# Patient Record
Sex: Male | Born: 1985 | Race: White | Hispanic: No | Marital: Single
Health system: Midwestern US, Community
[De-identification: ages and names within clinical notes are randomized; demographics above are authoritative.]

## PROBLEM LIST (undated history)

## (undated) DIAGNOSIS — M545 Low back pain, unspecified: Secondary | ICD-10-CM

## (undated) DIAGNOSIS — G8929 Other chronic pain: Secondary | ICD-10-CM

## (undated) DIAGNOSIS — J0300 Acute streptococcal tonsillitis, unspecified: Secondary | ICD-10-CM

## (undated) DIAGNOSIS — K047 Periapical abscess without sinus: Secondary | ICD-10-CM

## (undated) DIAGNOSIS — R110 Nausea: Secondary | ICD-10-CM

---

## 2004-05-22 ENCOUNTER — Ambulatory Visit: Payer: Self-pay | Admitting: Family Medicine

## 2004-10-13 ENCOUNTER — Ambulatory Visit: Payer: Self-pay | Admitting: Family Medicine

## 2010-05-05 LAB — CBC WITH DIFFERENTIAL
Absolute Baso #: 0.09 10*3/uL (ref 0.0–0.2)
Absolute Eos #: 0.17 10*3/uL (ref 0.0–0.4)
Absolute Lymph #: 2.89 10*3/uL (ref 1.0–4.8)
Absolute Mono #: 0.68 10*3/uL (ref 0.1–0.8)
Absolute Neut #: 4.67 10*3/uL (ref 1.8–7.7)
Basophils: 1 % (ref 0–2)
Eosinophils %: 2 % (ref 1–4)
Hematocrit: 49.7 % (ref 41–53)
Hemoglobin: 17.2 g/dL (ref 13.5–17.5)
Lymphocytes: 34 % (ref 24–44)
MCH: 30.8 pg (ref 26–34)
MCHC: 34.5 g/dL (ref 31–37)
MCV: 89.3 fL (ref 80–100)
MPV: 9.1 fL (ref 6.0–12.0)
Monocytes: 8 % — ABNORMAL HIGH (ref 1–7)
Morphology: NORMAL
Platelet Count: 203 10*3/uL (ref 140–450)
RBC: 5.57 m/uL (ref 4.5–5.9)
RDW: 13.6 % (ref 12.5–15.4)
Seg Neutrophils: 55 % (ref 36–66)
WBC: 8.5 10*3/uL (ref 3.5–11.0)

## 2010-05-05 LAB — T. PALLIDUM AB: T. Pallidum Ab: NONREACTIVE

## 2010-05-05 LAB — COMPREHENSIVE METABOLIC PANEL
ALT: 29 U/L (ref 4–40)
AST: 25 U/L (ref 8–36)
Albumin/Globulin Ratio: 2.2 (ref 1.0–2.7)
Albumin: 5.5 g/dL — ABNORMAL HIGH (ref 3.4–4.8)
Alkaline Phosphatase: 81 U/L (ref 25–100)
Anion Gap: 9 mmol/L (ref 8–16)
BUN: 13 mg/dL (ref 6–20)
CO2: 31 mmol/L (ref 20–31)
Calcium: 10.5 mg/dL — ABNORMAL HIGH (ref 8.6–10.4)
Chloride: 105 mmol/L (ref 98–110)
Creatinine: 0.85 mg/dL (ref 0.6–1.4)
GFR African American: >60 mL/min (ref >60)
GFR Non-African American: >60 mL/min (ref >60)
Glucose: 96 mg/dL (ref 74–106)
Potassium: 4.6 mmol/L (ref 3.5–5.1)
Protein, Total: 8 g/dL (ref 6.4–8.3)
Sodium: 141 mmol/L (ref 136–145)
Total Bilirubin: 0.8 mg/dL (ref 0.3–1.2)

## 2010-05-05 LAB — HEPATITIS B SURFACE ANTIGEN: Hepatitis B Surface Ag: NONREACTIVE

## 2010-05-05 LAB — HEPATITIS C ANTIBODY: Hepatitis C Ab: NONREACTIVE

## 2012-02-21 LAB — COMPREHENSIVE METABOLIC PANEL
ALT: 37 U/L (ref 4–40)
AST: 26 U/L (ref 8–36)
Albumin/Globulin Ratio: 1.7 (ref 1.0–2.7)
Albumin: 4.6 g/dL (ref 3.4–4.8)
Alkaline Phosphatase: 84 U/L (ref 25–100)
Anion Gap: 8 mmol/L (ref 8–16)
BUN: 10 mg/dL (ref 6–20)
CO2: 32 mmol/L — ABNORMAL HIGH (ref 20–31)
Calcium: 9.4 mg/dL (ref 8.6–10.4)
Chloride: 107 mmol/L (ref 98–110)
Creatinine: 0.86 mg/dL (ref 0.6–1.4)
GFR African American: >60 mL/min (ref >60)
GFR Non-African American: >60 mL/min (ref >60)
Glucose: 97 mg/dL (ref 74–106)
Potassium: 4.2 mmol/L (ref 3.5–5.1)
Sodium: 143 mmol/L (ref 136–145)
Total Bilirubin: 0.41 mg/dL (ref 0.3–1.2)
Total Protein: 7.3 g/dL (ref 6.4–8.3)

## 2012-02-21 LAB — LIPID PANEL
Chol/HDL Ratio: 3.7 (ref <5.0)
Cholesterol: 143 mg/dL (ref <200)
HDL: 39 mg/dL — ABNORMAL LOW (ref >40)
LDL Cholesterol: 94 mg/dL (ref <100)
Triglycerides: 49 mg/dL (ref <150)

## 2012-02-21 LAB — CBC WITH AUTO DIFFERENTIAL
Absolute Eos #: 0.1 10*3/uL (ref 0.0–0.4)
Absolute Lymph #: 2 10*3/uL (ref 1.0–4.8)
Absolute Mono #: 0.8 10*3/uL (ref 0.1–1.2)
Basophils Absolute: 0 10*3/uL (ref 0.0–0.2)
Basophils: 0 % (ref 0–2)
Eosinophils %: 2 % (ref 1–4)
Hematocrit: 46.3 % (ref 41–53)
Hemoglobin: 15.8 g/dL (ref 13.5–17.5)
Lymphocytes: 25 % (ref 24–44)
MCH: 30.1 pg (ref 26–34)
MCHC: 34 g/dL (ref 31–37)
MCV: 88.4 fL (ref 80–100)
MPV: 8.3 fL (ref 6.0–12.0)
Monocytes: 10 % (ref 2–11)
Platelets: 221 10*3/uL (ref 140–450)
RBC: 5.24 m/uL (ref 4.5–5.9)
RDW: 13.5 % (ref 12.5–15.4)
Seg Neutrophils: 63 % (ref 36–66)
Segs Absolute: 5.1 10*3/uL (ref 1.8–7.7)
WBC: 8 10*3/uL (ref 3.5–11.0)

## 2012-02-21 LAB — HEPATITIS PANEL, ACUTE
Hep A IgM: NONREACTIVE
Hep B Core Ab, IgM: NONREACTIVE
Hepatitis B Surface Ag: NONREACTIVE
Hepatitis C Ab: NONREACTIVE

## 2012-02-21 LAB — TSH: TSH: 0.63 mIU/L (ref 0.35–5.50)

## 2012-02-21 LAB — HIV SCREEN: HIV 1/2 Antibody: NONREACTIVE

## 2012-02-23 LAB — EKG 12-LEAD
Atrial Rate: 73 {beats}/min
P Axis: 55 degrees
P-R Interval: 152 ms
Q-T Interval: 360 ms
QRS Duration: 96 ms
QTc Calculation (Bazett): 397 ms
R Axis: 43 degrees
T Axis: 37 degrees
Ventricular Rate: 73 {beats}/min

## 2012-04-11 ENCOUNTER — Inpatient Hospital Stay
Admit: 2012-04-11 | Discharge: 2012-04-11 | Disposition: A | Discharge status: Home / Self Care | Attending: Emergency Medicine

## 2012-04-11 LAB — CBC WITH AUTO DIFFERENTIAL
Absolute Eos #: 0 10*3/uL (ref 0.0–0.4)
Absolute Lymph #: 1.7 10*3/uL (ref 1.0–4.8)
Absolute Mono #: 0.8 10*3/uL (ref 0.1–1.2)
Basophils Absolute: 0 10*3/uL (ref 0.0–0.2)
Basophils: 0 % (ref 0–2)
Eosinophils %: 0 % — ABNORMAL LOW (ref 1–4)
Hematocrit: 48.3 % (ref 41–53)
Hemoglobin: 16.4 g/dL (ref 13.5–17.5)
Lymphocytes: 16 % — ABNORMAL LOW (ref 24–44)
MCH: 30 pg (ref 26–34)
MCHC: 34 g/dL (ref 31–37)
MCV: 88.1 fL (ref 80–100)
MPV: 8.3 fL (ref 6.0–12.0)
Monocytes: 7 % (ref 2–11)
Platelets: 260 10*3/uL (ref 140–450)
RBC: 5.49 m/uL (ref 4.5–5.9)
RDW: 13.8 % (ref 12.5–15.4)
Seg Neutrophils: 77 % — ABNORMAL HIGH (ref 36–66)
Segs Absolute: 8.3 10*3/uL — ABNORMAL HIGH (ref 1.8–7.7)
WBC: 10.8 10*3/uL (ref 3.5–11.0)

## 2012-04-11 LAB — BASIC METABOLIC PANEL
Anion Gap: 18 mmol/L — ABNORMAL HIGH (ref 8–16)
BUN: 15 mg/dL (ref 6–20)
CO2: 27 mmol/L (ref 20–31)
Calcium: 10.5 mg/dL — ABNORMAL HIGH (ref 8.6–10.4)
Chloride: 104 mmol/L (ref 98–110)
Creatinine: 0.92 mg/dL (ref 0.6–1.4)
GFR African American: >60 mL/min (ref >60)
GFR Non-African American: >60 mL/min (ref >60)
Glucose: 91 mg/dL (ref 74–106)
Potassium: 4.2 mmol/L (ref 3.5–5.1)
Sodium: 144 mmol/L (ref 136–145)

## 2012-04-11 LAB — MICROSCOPIC URINALYSIS: RBC, UA: 2 /HPF (ref 0–2)

## 2012-04-11 LAB — HEPATIC FUNCTION PANEL
ALT: 31 U/L (ref 4–40)
AST: 27 U/L (ref 8–36)
Albumin/Globulin Ratio: 1.8 (ref 1.0–2.7)
Albumin: 5.3 g/dL — ABNORMAL HIGH (ref 3.4–4.8)
Alkaline Phosphatase: 94 U/L (ref 25–100)
Bilirubin, Direct: 0.25 mg/dL (ref 0.0–0.3)
Bilirubin, Indirect: 0.43 mg/dL (ref 0.0–1.0)
Total Bilirubin: 0.68 mg/dL (ref 0.3–1.2)
Total Protein: 8.3 g/dL (ref 6.4–8.3)

## 2012-04-11 LAB — URINALYSIS
Glucose, Ur: NEGATIVE
Leukocyte Esterase, Urine: NEGATIVE
Nitrite, Urine: NEGATIVE
Specific Gravity, UA: 1.031 — ABNORMAL HIGH (ref 1.005–1.030)
Urobilinogen, Urine: NORMAL
pH, UA: 6.5 (ref 5.0–8.0)

## 2012-04-11 LAB — LIPASE: Lipase: 24 U/L (ref 5.6–51.3)

## 2012-04-11 MED ORDER — MORPHINE SULFATE (PF) 4 MG/ML IV SOLN
4 MG/ML | Freq: Once | INTRAVENOUS | Status: AC
Start: 2012-04-11 — End: 2012-04-11
  Administered 2012-04-11: 13:00:00 via INTRAVENOUS

## 2012-04-11 MED ORDER — ONDANSETRON 4 MG PO TBDP
4 MG | ORAL_TABLET | Freq: Three times a day (TID) | ORAL | Status: DC | PRN
Start: 2012-04-11 — End: 2013-04-06

## 2012-04-11 MED ORDER — IBUPROFEN 800 MG PO TABS
800 MG | Freq: Once | ORAL | Status: DC
Start: 2012-04-11 — End: 2012-04-11

## 2012-04-11 MED ORDER — KETOROLAC TROMETHAMINE 30 MG/ML IJ SOLN
30 MG/ML | Freq: Once | INTRAMUSCULAR | Status: AC
Start: 2012-04-11 — End: 2012-04-11
  Administered 2012-04-11: 13:00:00 via INTRAVENOUS

## 2012-04-11 MED ORDER — SODIUM CHLORIDE 0.9 % IV BOLUS
0.9 % | Freq: Once | INTRAVENOUS | Status: AC
Start: 2012-04-11 — End: 2012-04-11
  Administered 2012-04-11: 13:00:00 via INTRAVENOUS

## 2012-04-11 MED ORDER — ONDANSETRON HCL 4 MG/2ML IJ SOLN
4 MG/2ML | Freq: Once | INTRAMUSCULAR | Status: AC
Start: 2012-04-11 — End: 2012-04-11
  Administered 2012-04-11: 16:00:00 via INTRAVENOUS

## 2012-04-11 MED ORDER — ONDANSETRON HCL 4 MG/2ML IJ SOLN
4 MG/2ML | Freq: Once | INTRAMUSCULAR | Status: AC
Start: 2012-04-11 — End: 2012-04-11
  Administered 2012-04-11: 13:00:00 via INTRAVENOUS

## 2012-04-11 MED ORDER — ACETAMINOPHEN 500 MG PO TABS
500 MG | Freq: Once | ORAL | Status: AC
Start: 2012-04-11 — End: 2012-04-11
  Administered 2012-04-11: 16:00:00 via ORAL

## 2012-04-11 MED FILL — MORPHINE SULFATE (PF) 4 MG/ML IV SOLN: 4 mg/mL | INTRAVENOUS | Qty: 1

## 2012-04-11 MED FILL — MAPAP 500 MG PO TABS: 500 MG | ORAL | Qty: 2

## 2012-04-11 MED FILL — ONDANSETRON HCL 4 MG/2ML IJ SOLN: 4 MG/2ML | INTRAMUSCULAR | Qty: 2

## 2012-04-11 MED FILL — KETOROLAC TROMETHAMINE 30 MG/ML IJ SOLN: 30 MG/ML | INTRAMUSCULAR | Qty: 1

## 2012-04-11 NOTE — ED Provider Notes (Signed)
[name]  eMERGENCY dEPARTMENT eNCOUnter  Physician assistant    Pt Name: David [name]  MRN: [id number]  Birthdate 29-Mar-1986  Date of evaluation: 04/11/2012    CHIEF COMPLAINT       Chief Complaint   Patient presents with   ??? Flank Pain     pt c/o bilat flank pain with n/v x 2 days.         HISTORY OF PRESENT ILLNESS    David [name] is a 26 y.o. male who presents with bilateral lower back pain which is worse on the left side.  Patient states he was having diffuse abdominal pain with nausea and vomiting yesterday morning.  He states that he vomited several times.  He also had 2 episodes of loose stools.  He states that since then he has not had any vomiting or diarrhea.  He is able to drink water without difficulty.  He denies any blood in his urine.  No history of renal calculi.  No dysuria.  No penile discharge.  No fevers or chills.  Patient states that his son did vomit one time this morning.  He denies any other sick contacts.       REVIEW OF SYSTEMS       Review of Systems   Constitutional: Negative for fever and chills.   Eyes: Negative for pain, discharge and itching.   Respiratory: Negative for cough, shortness of breath and wheezing.    Cardiovascular: Negative for chest pain and palpitations.   Gastrointestinal: Positive for nausea, vomiting, abdominal pain and diarrhea. Negative for constipation and abdominal distention.   Genitourinary: Negative for dysuria, hematuria and difficulty urinating.   Musculoskeletal: Positive for back pain. Negative for myalgias.   Skin: Negative for rash and wound.   Neurological: Negative for dizziness and headaches.          PAST MEDICAL HISTORY   None    SURGICAL HISTORY     None    CURRENT MEDICATIONS       There are no discharge medications for this patient.      ALLERGIES      has no known allergies.    FAMILY HISTORY     Noncontributory    SOCIAL HISTORY      reports that he has been smoking.  He does not have any smokeless tobacco history on  file. He reports that  drinks alcohol. He reports that he does not use illicit drugs.    PHYSICAL EXAM     INITIAL VITALS:  oral temperature is 98.6 ??F (37 ??C). His blood pressure is 132/88 and his pulse is 99. His respiration is 22 and oxygen saturation is 99%.        Physical Exam   Vitals reviewed.  Constitutional: He is oriented to person, place, and time. He appears well-developed and well-nourished.   HENT:   Head: Normocephalic and atraumatic.   Right Ear: External ear normal.   Left Ear: External ear normal.   Eyes: Left eye exhibits no discharge. No scleral icterus.   Neck: Normal range of motion. No tracheal deviation present.   Cardiovascular: Normal rate, regular rhythm and normal heart sounds.  Exam reveals no gallop.    No murmur heard.  Pulmonary/Chest: Effort normal and breath sounds normal. No stridor. No respiratory distress.   Abdominal: Soft. There is tenderness. There is CVA tenderness. There is no rebound and no guarding.   Musculoskeletal: Normal range of motion. He exhibits no  edema.   Neurological: He is alert and oriented to person, place, and time. Coordination normal.   Skin: Skin is warm and dry. No rash (on exposed surfaces) noted. He is not diaphoretic. No pallor.   Psychiatric: He has a normal mood and affect. His behavior is normal.         DIFFERENTIAL DIAGNOSIS/MDM:   Renal calculi, gastroenteritis.  Blood work are within normal limits, CT shows no renal calculi.  Will treat as a gastroenteritis.  Patient did have a fluid challenge as well as a small amount she states she feels better prior to discharge    DIAGNOSTIC RESULTS     EKG: All EKG's are interpreted by the Emergency Department Physician who either signs or Co-signs this chart in the absence of a cardiologist.    Not clinically indicated.      RADIOLOGY:   I directly visualized the following  images and reviewed the radiologist interpretations:   A Cat Scan of the  abdomen and pelvis  was obtained  [x]  Without Contrast    []  With Contrast  It is interpreted by the Radiologist.   []  Discussed with Radiologist.  []  It is also Visualized and interpreted by me     The interpretation is:  no urinary tract calculus or acute process in the abdomen or pelvis         ED BEDSIDE ULTRASOUND:   Not clinically indicated.      LABS:  Labs Reviewed   CBC WITH AUTO DIFFERENTIAL - Abnormal; Notable for the following:     Seg Neutrophils 77 (*)     Lymphocytes 16 (*)     Eosinophils 0 (*)     Segs Absolute 8.30 (*)     All other components within normal limits   URINALYSIS - Abnormal; Notable for the following:     Color, UA AMBER (*)     Bilirubin Urine   (*)     Value: Presumptive positive. Unable to confirm due to unavailability of reagent.    Ketones, Urine LARGE (*)     Specific Gravity, UA 1.031 (*)     Urine Hgb TRACE (*)     Protein, UA 1+ (*)     All other components within normal limits   BASIC METABOLIC PANEL - Abnormal; Notable for the following:     Calcium 10.5 (*)     Anion Gap 18 (*)     All other components within normal limits   HEPATIC FUNCTION PANEL - Abnormal; Notable for the following:     Alb 5.3 (*)     All other components within normal limits   MICROSCOPIC URINALYSIS - Abnormal; Notable for the following:     Mucus, UA 3+ (*)     All other components within normal limits   LIPASE        within normal limits       EMERGENCY DEPARTMENT COURSE:   Vitals:    Filed Vitals:    04/11/12 0749 04/11/12 1100   BP: 135/81 132/88   Pulse: 129 99   Temp: 98.6 ??F (37 ??C)    TempSrc: Oral Oral   Resp: 26 22   SpO2: 99%      Tachycardic, vitals otherwise stable    1:26 PM Patient states he is feeling better, has eaten a little of bland diet tray.  Feels ready to go home.    CRITICAL CARE:  Not clinically indicated.      CONSULTS:  Not clinically indicated.      PROCEDURES:  Not clinically indicated.      FINAL IMPRESSION      1. Abdominal pain          DISPOSITION/PLAN   DISPOSITION Decision to Discharge    PATIENT REFERRED TO:        a  clinic list is provided below      DISCHARGE MEDICATIONS:  There are no discharge medications for this patient.      (Please note that portions of this note were completed with a voice recognition program.  Efforts were made to edit the dictations but occasionally words are mis-transcribed.)    Denton Ar [facility]  Emergency Medicine Physician Assistant        Denton Ar, PA-C  04/11/12 [phone]

## 2012-04-11 NOTE — ED Provider Notes (Signed)
[facility]  Emergency Department  Faculty Attestation     Primary Care Physician  No primary provider on file.    I performed a history and physical examination of the patient and discussed management with the resident. I reviewed the resident???s note and agree with the documented findings and plan of care. Any areas of disagreement are noted on the chart. I was personally present for the key portions of any procedures. I have documented in the chart those procedures where I was not present during the key portions. I have reviewed the emergency nurses triage note. I agree with the chief complaint, past medical history, past surgical history, allergies, medications, social and family history as documented unless otherwise noted below. Documentation of the HPI, Physical Exam and Medical Decision Making performed by medical students or scribes is based on my personal performance of the HPI, PE and MDM. For Physician Assistant/ Nurse Practitioner cases/documentation I have personally evaluated this patient and have completed at least one if not all key elements of the E/M (history, physical exam, and MDM). Additional findings are as noted.      PERTINENT ATTENDING PHYSICIAN COMMENTS:    RECENT VITALS:     Temp: 98.6 ??F (37 ??C),  Pulse: 129, Resp: 26, BP: 135/81 mmHg, SpO2: 99 %    Presents with chief complaint of   Chief Complaint   Patient presents with   ??? Flank Pain     pt c/o bilat flank pain with n/v x 2 days.   .     Denies kidney stonese. left flank pain for the past 2 days.  Has had some nausea and vomiting.    Physical Exam:  Gen: Non-toxic, Afebrile  Cards: Regular rate and rhythm.   Pulm: Lung sounds clear to auscultation  Abdomen: Soft, non-tender, non-distended  Back: left CVA tenderness    Plan:   Labs, Pain control      Rubin Payor,  MD, [facility]  Attending Emergency  Physician      Rubin Payor, MD  04/11/12 [phone]

## 2012-04-11 NOTE — ED Notes (Signed)
Pt arrives to ED today. PT alert and oriented x3, speaking full sentences. Resp even and non labored. To ED today for abdominal pain. Pt states started last night. Pt states pain in bilat flank region. Complains of nausea and vomiting. Pt appears uncomfortable. Jen PA at bedside. Updated on plan of care. Duration unknown    Berneice Gandy, RN  04/11/12 [phone]

## 2012-04-11 NOTE — Discharge Instructions (Signed)
Felix Pacini diet for the next 24-48 hours    Return to the ED for increased pain, swelling, vomiting, fever or other emergent concerns            Abdominal Pain (Nonspecific)  Your exam might not show the exact reason you have abdominal pain. Since there are many different causes of abdominal pain, another checkup and more tests may be needed. It is very important to follow up for lasting (persistent) or worsening symptoms. A possible cause of abdominal pain in any person who still has his or her appendix is acute appendicitis. Appendicitis is often hard to diagnose. Normal blood tests, urine tests, ultrasound, and CT scans do not completely rule out early appendicitis or other causes of abdominal pain. Sometimes, only the changes that happen over time will allow appendicitis and other causes of abdominal pain to be determined. Other potential problems that may require surgery may also take time to become more apparent. Because of this, it is important that you follow all of the instructions below.  HOME CARE INSTRUCTIONS     Rest as much as possible.   Do not eat solid food until your pain is gone.   While adults or children have pain: A diet of water, weak decaffeinated tea, broth or bouillon, gelatin, oral rehydration solutions (ORS), frozen ice pops, or ice chips may be helpful.   When pain is gone in adults or children: Start a light diet (dry toast, crackers, applesauce, or white rice). Increase the diet slowly as long as it does not bother you. Eat no dairy products (including cheese and eggs) and no spicy, fatty, fried, or high-fiber foods.   Use no alcohol, caffeine, or cigarettes.   Take your regular medicines unless your caregiver told you not to.   Take any prescribed medicine as directed.   Only take over-the-counter or prescription medicines for pain, discomfort, or fever as directed by your caregiver. Do not give aspirin to children.  If your caregiver has given you a follow-up appointment, it  is very important to keep that appointment. Not keeping the appointment could result in a permanent injury and/or lasting (chronic) pain and/or disability. If there is any problem keeping the appointment, you must call to reschedule.    SEEK IMMEDIATE MEDICAL CARE IF:     Your pain is not gone in 24 hours.   Your pain becomes worse, changes location, or feels different.   You or your child has an oral temperature above 102 F (38.9 C), not controlled by medicine.   Your baby is older than 3 months with a rectal temperature of 102 F (38.9 C) or higher.   Your baby is 53 months old or younger with a rectal temperature of 100.4 F (38 C) or higher.   You have shaking chills.   You keep throwing up (vomiting) or cannot drink liquids.   There is blood in your vomit or you see blood in your bowel movements.   Your bowel movements become dark or black.   You have frequent bowel movements.   Your bowel movements stop (become blocked) or you cannot pass gas.   You have bloody, frequent, or painful urination.   You have yellow discoloration in the skin or whites of the eyes.   Your stomach becomes bloated or bigger.   You have dizziness or fainting.   You have chest or back pain.  MAKE SURE YOU:     Understand these instructions.   Will watch your  condition.   Will get help right away if you are not doing well or get worse.  Document Released: 07/05/2005 Document Revised: 09/27/2011 Document Reviewed: 06/02/2009  [facility] Patient Information 2013 [place], [place].        B.R.A.T. Diet  Your doctor has recommended the B.R.A.T. diet for you or your child until the condition improves. This is often used to help control diarrhea and vomiting symptoms. If you or your child can tolerate clear liquids, you may have:   Bananas.   Rice.   Applesauce.   Toast (and other simple starches such as crackers, potatoes, noodles).  Be sure to avoid dairy products, meats, and fatty foods until symptoms are better. Fruit  juices such as apple, grape, and prune juice can make diarrhea worse. Avoid these. Continue this diet for 2 days or as instructed by your caregiver.  Document Released: 07/05/2005 Document Revised: 09/27/2011 Document Reviewed: 12/22/2006  [facility] Patient Information 2013 [place], [place].

## 2012-04-11 NOTE — ED Notes (Signed)
Pt states pain is better, states pain is however, still present. Updated on plan of care. Duration unknown    Berneice Gandy, RN  04/11/12 1019

## 2012-07-13 NOTE — Discharge Instructions (Signed)
Please call central scheduling at [phone] to schedule MRI    Return to the ED for worsening headache, blurry vision, numbness, tingling, weakness, if symptoms return or for other emergent concerns    A clinic list is below to establish a family doctor        Headache  Headaches are caused by many different problems. Most commonly, headache is caused by muscle tension from an injury, fatigue, or emotional upset. Excessive muscle contractions in the scalp and neck result in a headache that often feels like a tight band around the head. Tension headaches often have areas of tenderness over the scalp and the back of the neck. These headaches may last for hours, days, or longer, and some may contribute to migraines in those who have migraine problems.  Migraines usually cause a throbbing headache, which is made worse by activity. Sometimes only one side of the head hurts. Nausea, vomiting, eye pain, and avoidance of food are common with migraines. Visual symptoms such as light sensitivity, blind spots, or flashing lights may also occur. Loud noises may worsen migraine headaches. Many factors may cause migraine headaches:   Emotional stress, lack of sleep, and menstrual periods.   Alcohol and some drugs (such as birth control pills).   Diet factors (fasting, caffeine, food preservatives, chocolate).   Environmental factors (weather changes, bright lights, odors, smoke).  Other causes of headaches include minor injuries to the head. Arthritis in the neck; problems with the jaw, eyes, ears, or nose are also causes of headaches. Allergies, drugs, alcohol, and exposure to smoke can also cause moderate headaches. Rebound headaches can occur if someone uses pain medications for a long period of time and then stops. Less commonly, blood vessel problems in the neck and brain (including stroke) can cause various types of headache.  Treatment of headaches includes medicines for pain and relaxation. Ice packs or heat  applied to the back of the head and neck help some people. Massaging the shoulders, neck and scalp are often very useful. Relaxation techniques and stretching can help prevent these headaches. Avoid alcohol and cigarette smoking as these tend to make headaches worse. Please see your caregiver if your headache is not better in 2 days.   SEEK IMMEDIATE MEDICAL CARE IF:    You develop a high fever, chills, or repeated vomiting.   You faint or have difficulty with vision.   You develop unusual numbness or weakness of your arms or legs.   Relief of pain is inadequate with medication, or you develop severe pain.   You develop confusion, or neck stiffness.   You have a worsening of a headache or do not obtain relief.  Document Released: 07/05/2005 Document Revised: 06/24/2011 Document Reviewed: 12/29/2006  [facility] Patient Information 2012 [place], [place].      [facility] Emergency Medicine Clinic List    [facility]   [address]   [phone]    Pediatric Primary Care/ Adult Primary Care / OB/Prenatal/GYN/Specialty Clinics    Monday - Friday (8a - 4:30p)  -----------------------------------------------------------  [facility]   [address]   [phone]    Assistance with applying for chronic long term meds (high BP, Diabetes, etc), offered thru programs made available by various pharmaceutical companies    Monday - Friday   Call to make appointment  -----------------------------------------------------------  [facility]   [address] [place]   [phone]    Pediatrics and [facility]  Monday - Friday (9a - 7p)  ------------------------------------------------------------  [facility]   [address]   [phone]     Adult Medicine, Pediatrics, OB/GYN   Monday - Friday (8:30a - 5p)  -----------------------------------------------------------  [facility]   [address] [place],  [place]   [phone]   Wednesday AM each week  -------------------------------------------------------------  [facility]   [address], [place]  [address]    Adult Internal Medicine   [phone]   Monday, Tuesday, Thursday, Friday (8a - 4p); Wednesday (1p - 4P)       OB/GYN Clinic   [phone]    Monday, Tuesday, Thursday, Friday (10a - 4p); Wed (1p - 4p)   (closed from 12p  - 1p)   Pediatrics Clinic   [phone]    Monday, Tuesday, Thursday, Friday (8:30 a - 4:15p)   Wednesday (12:30p - 4:15p)   Podiatry Clinic   [phone]     Monday, Wednesday, Friday (1p to 5p)  [facility]   [address]    [phone]    Pediatric Primary Care   Monday - Wednesday & Friday   Adult Primary Care   Monday - Friday  (8a - 12p)   OB/Prenatal   Thurs - 8a - 4:45p  -------------------------------------------------------------  [facility]   [address] [facility]     Monday - Friday (8:30a - 5p)   OB/GYN    [phone]    Adult Internal Medicine   [phone]   Pediatrics    [phone]   Neurology / Headache    [phone]  -------------------------------------------------------------  [facility]   [phone]    Family Practice   Monday - Friday (9a - 5p)  -------------------------------------------------------------  [facility]   Specialty Clinics    [phone]   Burn / Plastic, ENT, GI, Orthopedics, (Orthopedics D.O.), Surgical, TRAUMA, Urology, Vascular    Call for appointment  -------------------------------------------------------------  [facility]   [address]   [phone]    Adult Medicine, Eye Clinic, Dental   Patient must be certified homeless    Days and hours vary   Call for appointment                   [facility]   [address]   Family Practice   [phone]      Obstetrical   [phone]    Monday, Tuesday, Wednesday, Friday (9a - 5p)   Thursday - 9a - 6p      Wednesday (OB only)  -------------------------------------------------------------  [facility]   [address]   [phone]    Family Practice    Monday, Tuesday, Thursday, Friday (9a - 5p)    (closed 12p -1p)   Wednesday (1p - 5p)  -----------------------------------------------------------  [facility]    [phone]    Various Clinic (8a - 5:30)  -----------------------------------------------------------  [facility]   [address], [place], [place]  [address]   [phone]     OB / Prenatal   Tues - 8a - 4:45p   Family Practice    Monday - Wednesday & Friday (8a - 4:45p)  -------------------------------------------------------------  [facility]   [address]   [phone]     Family Practice  Monday - Friday (8a - 4:30p)  -------------------------------------------------------------  [place], [place], [id number]   [phone]    Mon - Fri 8a - 4:30p   [phone], [place]  [address]   [phone]   Monday - Friday  (8a-4:30p); Thursday ([place]   [address]   [phone]   Monday - Friday ([place])  -------------------------------------------------------------  [facility]   [phone]     Call for appointment  -------------------------------------------------------------  [place]  [phone]  Mon - Fri  8:30a - 4p

## 2012-07-13 NOTE — Progress Notes (Signed)
I did not see this pt or participate in his care.

## 2012-07-13 NOTE — ED Provider Notes (Signed)
[facility]     Emergency Department     Faculty Attestation    I performed a history and physical examination of the patient and discussed management with the resident. I reviewed the resident???s note and agree with the documented findings and plan of care. Any areas of disagreement are noted on the chart. I was personally present for the key portions of any procedures. I have documented in the chart those procedures where I was not present during the key portions. I have reviewed the emergency nurses triage note. I agree with the chief complaint, past medical history, past surgical history, allergies, medications, social and family history as documented unless otherwise noted below. Documentation of the HPI, Physical Exam and Medical Decision Making performed by medical students or scribes is based on my personal performance of the HPI, PE and MDM. For Physician Assistant/ Nurse Practitioner cases/documentation I have personally evaluated this patient and have completed at least one if not all key elements of the E/M (history, physical exam, and MDM). Additional findings are as noted.    Cranial nerves intact, eyes PERRLA, spleen flashlight was negative, no meningeal signs, cerebellar testing normal, no photophobia.         Sharen Counter MD  Attending Emergency  Physician            Hurshel Party, MD  07/13/12 [phone]

## 2012-07-13 NOTE — ED Provider Notes (Signed)
[name]  eMERGENCY dEPARTMENT eNCOUnter  Physician assistant    Pt Name: David [name]  MRN: [id number]  Birthdate May 13, 1986  Date of evaluation: 07/13/2012    CHIEF COMPLAINT       Chief Complaint   Patient presents with   ??? Eye Pain     Pt to ED with left sided eye pain and headache that started about 2 weeks ago off and on.  Pt states his left pupil wasn't reacting to light and was seen by his doctor and told him he might have had a TIA.  Pt pupils are reactive by light bilaterally         HISTORY OF PRESENT ILLNESS    AMIERE [name] is a 26 y.o. male who presents with complaint of headache behind his left eye, and "pinpoint pupil".  He states this has been occuring for the past week.  He states that the nurse at [facility] said his pupil was not reacting to light.  When he has this pain he has mild swelling around his left eye, palpatations, "pinpoint pupil".  He states that the pain usually lasts about 1-2 hours, comes on gradually, gets worse and then disappears.  No trauma.  No glasses or contacts.  He states that his vision is worse but not blurry.He denies any palpitations, chest pain now.  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  He denies any history of IV drug abuse.  He denies any fevers or chills.  He denies any neck pain.patient states that he does have a history of migraine headaches which usually do occur on the left however he states that this is different from his usual migraine in that it is more linear and the pain shoots up towards his forehead.  He reports that he has had headaches worse than this in the past.  He denies any weakness.  He denies any changes in his gait.  He denies any difficulty speaking.  He denies any changes in taste smell or hearing.patient denies family history of aneurysm.  He denies any history of aneurysm for himself.      7:28 PM spoke with nursing staff at [facility], patient came  to the clinic complaining of pain to his left eye.  She states his pupil was "pinpoint" and nonreactive.  When the paramedics came his left eye was reactive but his right eye was nonreactive.  His BP was 155/84. OK to send results to Dr. Vernia Buff at [facility]  REVIEW OF SYSTEMS         Review of Systems   Constitutional: Negative for fever and chills.   HENT: Negative for ear pain, sore throat and rhinorrhea.    Eyes: Positive for pain. Negative for discharge and itching.   Respiratory: Negative for cough, shortness of breath and wheezing.    Cardiovascular: Negative for chest pain and palpitations.   Gastrointestinal: Negative for nausea and vomiting.   Genitourinary: Negative for dysuria, hematuria and difficulty urinating.   Musculoskeletal: Negative for myalgias and back pain.   Skin: Negative for rash and wound.   Neurological: Positive for headaches. Negative for dizziness.   Psychiatric/Behavioral: Negative for dysphoric mood.         PAST MEDICAL HISTORY    has a past medical history of Headache.    SURGICAL HISTORY     none    CURRENT MEDICATIONS  Discharge Medication List as of 07/13/2012  8:29 PM      CONTINUE these medications which have NOT CHANGED    Details   ondansetron (ZOFRAN ODT) 4 MG disintegrating tablet Take 1 tablet by mouth every 8 hours as needed for Nausea., Disp-8 tablet, R-0             ALLERGIES      has no known allergies.    FAMILY HISTORY     No family history of aneurysm    SOCIAL HISTORY      reports that he has been smoking.  He does not have any smokeless tobacco history on file. He reports that  drinks alcohol. He reports that he does not use illicit drugs.    PHYSICAL EXAM     INITIAL VITALS:  oral temperature is 97.5 ??F (36.4 ??C). His blood pressure is 136/90 and his pulse is 80. His respiration is 16 and oxygen saturation is 100%.        Physical Exam   Vitals reviewed.  Constitutional: He is oriented to person, place, and time. He appears well-developed and well-nourished.    HENT:   Head: Normocephalic and atraumatic.   Right Ear: External ear normal.   Left Ear: External ear normal.   Eyes: Conjunctivae and EOM are normal. Pupils are equal, round, and reactive to light. Right eye exhibits no chemosis and no discharge. Left eye exhibits no chemosis and no discharge. No scleral icterus. Right eye exhibits normal extraocular motion and no nystagmus. Left eye exhibits normal extraocular motion and no nystagmus.   Pupils are equal, round and reactive to light.  There is no swelling periorbitally.  There is no pain on palpation periorbital    Neck: Normal range of motion. No tracheal deviation present. No Brudzinski's sign and no Kernig's sign noted.   Cardiovascular: Normal rate, regular rhythm and normal heart sounds.  Exam reveals no gallop.    No murmur heard.  Pulmonary/Chest: Effort normal and breath sounds normal. No stridor. No respiratory distress.   Abdominal: There is no tenderness. There is no rebound and no guarding.   Musculoskeletal: Normal range of motion. He exhibits no edema.   Lymphadenopathy:     He has no cervical adenopathy.   Neurological: He is alert and oriented to person, place, and time. He has normal strength. No cranial nerve deficit or sensory deficit. He displays a negative Romberg sign. Coordination and gait normal. GCS eye subscore is 4. GCS verbal subscore is 5. GCS motor subscore is 6.   Reflex Scores:       Patellar reflexes are 2+ on the right side and 2+ on the left side.  Skin: Skin is warm and dry. No rash (on exposed surfaces) noted. He is not diaphoretic. No pallor.   Psychiatric: He has a normal mood and affect. His behavior is normal.         DIFFERENTIAL DIAGNOSIS/MDM:   Complex migraine, anisocoria, brain aneurysm.  Patient is currently not having any symptoms.  He states that he does have a small amount of pain above his eye.  Patient denies any blurry vision currently.  His pupils are equal and reactive.  He has no neck pain.  He is afebrile.   We will write a prescription for an outpatient MRI with results to go to his doctor at [facility].  We will attempt to schedule at postacute care followup so that patient may establish a family Dr.  Patient may need further followup  at an ophthalmologist as well.    DIAGNOSTIC RESULTS     EKG: All EKG's are interpreted by the Emergency Department Physician who either signs or Co-signs this chart in the absence of a cardiologist.    Not clinically indicated.      RADIOLOGY:   I directly visualized (with the attending physician) the following  images and reviewed the radiologist interpretations:   Not clinically indicated.        ED BEDSIDE ULTRASOUND:   Not clinically indicated.      LABS:  Labs Reviewed - No data to display    Not clinically indicated.        EMERGENCY DEPARTMENT COURSE:   Vitals:    Filed Vitals:    07/13/12 1915   BP: 136/90   Pulse: 80   Temp: 97.5 ??F (36.4 ??C)   TempSrc: Oral   Resp: 16   SpO2: 100%     Vitals are stable    8:16 PM Patient states pain is relieved.  Pupils still equal and reactive.    CRITICAL CARE:  Not clinically indicated.      CONSULTS:  Not clinically indicated.      PROCEDURES:  Not clinically indicated.      FINAL IMPRESSION      1. Headache(784.0)          DISPOSITION/PLAN   DISPOSITION     PATIENT REFERRED TO:        a clinic list is provided below to establish a family doctor    [facility] [address]  [place] [address]      as scheduled      DISCHARGE MEDICATIONS:  Discharge Medication List as of 07/13/2012  8:29 PM          (Please note that portions of this note were completed with a voice recognition program.  Efforts were made to edit the dictations but occasionally words are mis-transcribed.)    Denton Ar [facility]  Emergency Medicine Physician Assistant            Denton Ar, PA-C  07/13/12 2104

## 2012-07-13 NOTE — ED Notes (Signed)
Social worker called [employer] to arrange for patient's  transportation back to the facility. Nurse states that patient will be picked-up from the waiting room by their staff. Patient verbalized understanding and waiting in the ER.

## 2012-07-14 ENCOUNTER — Inpatient Hospital Stay
Admit: 2012-07-14 | Discharge: 2012-07-14 | Disposition: A | Discharge status: Home / Self Care | Attending: Emergency Medicine

## 2012-07-14 MED ADMIN — ibuprofen (ADVIL;MOTRIN) tablet 800 mg: ORAL | NDC 00904518740

## 2012-07-14 MED FILL — IBUPROFEN 800 MG PO TABS: 800 MG | ORAL | Qty: 1

## 2012-07-18 LAB — COMPREHENSIVE METABOLIC PANEL
ALT: 27 U/L (ref 4–40)
AST: 29 U/L (ref 8–36)
Albumin/Globulin Ratio: 2 (ref 1.0–2.7)
Albumin: 4.7 g/dL (ref 3.4–4.8)
Alkaline Phosphatase: 70 U/L (ref 25–100)
Anion Gap: 9 mmol/L (ref 8–16)
BUN: 10 mg/dL (ref 6–20)
CO2: 32 mmol/L — ABNORMAL HIGH (ref 20–31)
Calcium: 9.6 mg/dL (ref 8.6–10.4)
Chloride: 105 mmol/L (ref 98–110)
Creatinine: 0.87 mg/dL (ref 0.6–1.4)
GFR African American: >60 mL/min (ref >60)
GFR Non-African American: >60 mL/min (ref >60)
Glucose: 93 mg/dL (ref 74–106)
Potassium: 4.4 mmol/L (ref 3.5–5.1)
Sodium: 142 mmol/L (ref 136–145)
Total Bilirubin: 0.38 mg/dL (ref 0.3–1.2)
Total Protein: 7.1 g/dL (ref 6.4–8.3)

## 2012-07-18 LAB — CBC WITH AUTO DIFFERENTIAL
Absolute Eos #: 0 10*3/uL (ref 0.0–0.4)
Absolute Lymph #: 2.7 10*3/uL (ref 1.0–4.8)
Absolute Mono #: 0.7 10*3/uL (ref 0.1–1.2)
Basophils Absolute: 0 10*3/uL (ref 0.0–0.2)
Basophils: 0 % (ref 0–2)
Eosinophils %: 0 % — ABNORMAL LOW (ref 1–4)
Hematocrit: 43 % (ref 41–53)
Hemoglobin: 14.4 g/dL (ref 13.5–17.5)
Lymphocytes: 33 % (ref 24–44)
MCH: 29.7 pg (ref 26–34)
MCHC: 33.5 g/dL (ref 31–37)
MCV: 88.9 fL (ref 80–100)
MPV: 8 fL (ref 6.0–12.0)
Monocytes: 8 % (ref 2–11)
Platelets: 230 10*3/uL (ref 140–450)
RBC: 4.84 m/uL (ref 4.5–5.9)
RDW: 13.1 % (ref 12.5–15.4)
Seg Neutrophils: 59 % (ref 36–66)
Segs Absolute: 4.9 10*3/uL (ref 1.8–7.7)
WBC: 8.3 10*3/uL (ref 3.5–11.0)

## 2012-07-18 LAB — HEPATITIS B SURFACE ANTIGEN: Hepatitis B Surface Ag: NONREACTIVE

## 2012-07-18 LAB — HEPATITIS C ANTIBODY: Hepatitis C Ab: NONREACTIVE

## 2012-07-20 LAB — T. PALLIDUM AB: T. Pallidum Ab: NONREACTIVE

## 2012-09-25 ENCOUNTER — Inpatient Hospital Stay
Admit: 2012-09-25 | Discharge: 2012-09-25 | Disposition: A | Discharge status: Home / Self Care | Attending: Emergency Medicine

## 2012-09-25 MED ORDER — PROMETHAZINE HCL 25 MG PO TABS
25 MG | ORAL_TABLET | Freq: Four times a day (QID) | ORAL | Status: AC | PRN
Start: 2012-09-25 — End: 2012-10-02

## 2012-09-25 MED ADMIN — ibuprofen (ADVIL;MOTRIN) tablet 800 mg: ORAL | @ 19:00:00 | NDC 00904518740

## 2012-09-25 MED ADMIN — promethazine (PHENERGAN) tablet 25 mg: ORAL | @ 18:00:00 | NDC 00591530701

## 2012-09-25 MED FILL — IBUPROFEN 800 MG PO TABS: 800 MG | ORAL | Qty: 1

## 2012-09-25 MED FILL — PROMETHAZINE HCL 25 MG PO TABS: 25 MG | ORAL | Qty: 1

## 2012-09-25 NOTE — ED Notes (Signed)
Sitting up on stretcher and stated ready to be discharged md informed    Candee Furbish, RN  09/25/12 1525

## 2012-09-25 NOTE — ED Notes (Signed)
Resting on stretcher and med gien.  Awaiting holf hour for med to work before additonal med    Candee Furbish, RN  09/25/12 1416

## 2012-09-25 NOTE — ED Notes (Signed)
Received into room 4a via triage with c/o abd pain with nausea , vomiting and diarrhea.  Pain at an [address], [place]  09/25/12 1336

## 2012-09-25 NOTE — ED Provider Notes (Signed)
[name]  eMERGENCY dEPARTMENT eNCOUnter  Resident    Pt Name: JOHNATHON OLDEN  MRN: [id number]  Birthdate [birth year]  Date of evaluation: 09/25/2012    CHIEF COMPLAINT       Chief Complaint   Patient presents with   ??? Abdominal Pain     c/o abd pain with n/v/d x 3 days. also c/o body aches.         HISTORY OF PRESENT ILLNESS    BRAIDYN SCORSONE is a 27 y.o. male who presents 5 days history of nausea vomiting abdominal pain diarrhea fatigue malaise.  Patient states this started on Thursday.  He is having 2-3 episodes of nonbilious nonbloody vomiting each day, he is unable to keep much by mouth intake down.  He has had some success tolerating Powerade.  He also endorses 4-5 loose, nonbloody stools daily.  He states he's had generalized malaise and fatigue as well as a mild headache.  He has tried Motrin with some relief.  He has tried ODT Zofran with some relief though not totally resolved.  He is a methadone patient for chronic Percocet abuse.  He has not missed any doses.  He denies any history of IV drug abuse.  He has not had a flu shot  He has had sick contacts of one of his children as well as his girlfriend.     Location/Symptom: nausea vomiting diarrhea, generalized malaise  Timing/Onset: 5 days ago  Context/Setting: no known at  Quality: achy he had fatigue  Duration: constant  Modifying Factors: mild relief with Motrin and Zofran  Severity: mild    REVIEW OF SYSTEMS         Review of Systems   Constitutional: Positive for fatigue. Negative for fever and diaphoresis.   HENT: Positive for congestion and rhinorrhea.    Eyes: Negative for photophobia and visual disturbance.   Respiratory: Negative for cough and shortness of breath.    Cardiovascular: Negative for chest pain and leg swelling.   Gastrointestinal: Positive for nausea, vomiting, abdominal pain and diarrhea.   Endocrine: Negative for polydipsia and polyphagia.   Genitourinary: Negative for dysuria, urgency, frequency, flank pain and  decreased urine volume.   Musculoskeletal: Positive for myalgias. Negative for back pain.   Neurological: Positive for headaches. Negative for seizures and light-headedness.   Psychiatric/Behavioral: Negative for dysphoric mood and decreased concentration.           PAST MEDICAL HISTORY    has a past medical history of Headache.  Percocet abuse    SURGICAL HISTORY      has no past surgical history on file.  none    CURRENT MEDICATIONS       Previous Medications    METHADONE (METHADOSE) 40 MG DISINTEGRATING TABLET    Take 105 mg by mouth daily.    ONDANSETRON (ZOFRAN ODT) 4 MG DISINTEGRATING TABLET    Take 1 tablet by mouth every 8 hours as needed for Nausea.       ALLERGIES      has no known allergies.    FAMILY HISTORY     has no family status information on file.    Noncontributory to this complaint    SOCIAL HISTORY      reports that he has been smoking.  He does not have any smokeless tobacco history on file. He reports that  drinks alcohol. He reports that he does not use illicit drugs.    PHYSICAL EXAM  INITIAL VITALS:  oral temperature is 98.2 ??F (36.8 ??C). His blood pressure is 134/75 and his pulse is 64. His respiration is 16 and oxygen saturation is 92%.        Physical Exam   Constitutional: He is oriented to person, place, and time and well-developed, well-nourished, and in no distress. No distress.   HENT:   Head: Normocephalic and atraumatic.   Mouth/Throat: Oropharynx is clear and moist.   Eyes: EOM are normal. Pupils are equal, round, and reactive to light.   Neck: Normal range of motion. Neck supple.   Cardiovascular: Normal rate, regular rhythm and normal heart sounds.    Pulmonary/Chest: Effort normal and breath sounds normal. He has no wheezes.   Abdominal: Soft. Bowel sounds are normal. There is no tenderness.   Musculoskeletal: Normal range of motion. He exhibits no edema.   Lymphadenopathy:     He has no cervical adenopathy.   Neurological: He is alert and oriented to person, place, and  time. GCS score is 15.   Skin: Skin is warm and dry. No rash noted. He is not diaphoretic.   Psychiatric: Mood and affect normal.         DIFFERENTIAL DIAGNOSIS/MDM:   27 year old male 5 days history of nausea vomiting diarrhea and malaise    flulike illness versus gastroenteritis    --Patient does not appear to be in any distress, he is afebrile and not tachycardic  --Will treat with Phenergan, Motrin and try to by mouth challenge  --Clear to get him feeling better and tolerating by mouth, will likely discharge home    DIAGNOSTIC RESULTS     EKG: All EKG's are interpreted by the Emergency Department Physician who either signs or Co-signs this chart in the absence of a cardiologist.    none    RADIOLOGY:   I directly visualized the following  images and reviewed the radiologist interpretations:           ED BEDSIDE ULTRASOUND:   none    LABS:  Labs Reviewed - No data to display    none      EMERGENCY DEPARTMENT COURSE:   Vitals:    Filed Vitals:    09/25/12 1153 09/25/12 1330   BP: 134/75    Pulse: 71 64   Temp: 98.1 ??F (36.7 ??C) 98.2 ??F (36.8 ??C)   TempSrc: Oral    Resp: 22 16   SpO2: 92%      1500: Patient given by mouth Phenergan and Motrin.  He states his abdominal pain is feeling better he is no longer nauseated.  He is drinking some small sips of water.  He just got in the Motrin about 10 minutes ago.  We'll give that time to set in.  Patient says he thinks he would feel comfortable going home.  He states he does have adult at home.      1530: patient is tolerating by mouth.  He says he is feeling better his nausea has resolved.  He feels comfortable going home.  I'll provide him with a prescription for Phenergan tablets.  He said he has Motrin at home.  I instructed him to return if his symptoms worsen, if he develops high fever unable to tolerate any by mouth intake.  CRITICAL CARE:    CONSULTS:  None      PROCEDURES:      FINAL IMPRESSION      1. Flu-like symptoms    2. Nausea    3. Vomiting  4. Diarrhea             DISPOSITION/PLAN   DISPOSITION     Patient stable and ready for discharge home. I have discussed plan of care with patient and they are in understanding. They were instructed to read discharge paperwork. All of their questions and concerns were addressed.         PATIENT REFERRED TO:  [facility]  [address]  [place] [address]  [phone]    If symptoms worsen      DISCHARGE MEDICATIONS:  New Prescriptions    PROMETHAZINE (PHENERGAN) 25 MG TABLET    Take 1 tablet by mouth every 6 hours as needed for Nausea for 7 days. WARNING:  May cause drowsiness.  May impair ability to operate vehicles or machinery.  Do not use in combination with alcohol.       (Please note that portions of this note were completed with a voice recognition program.  Efforts were made to edit the dictations but occasionally words are mis-transcribed.)    Roselind Messier  Emergency Medicine Resident                Roselind Messier, MD  Resident  09/25/12 [phone]

## 2012-09-25 NOTE — ED Notes (Signed)
Restin gon stretcher and nausea better.  Additional med given    Candee Furbish, RN  09/25/12 1444

## 2012-09-25 NOTE — ED Provider Notes (Signed)
[facility]     Emergency Department     Faculty Attestation    I performed a history and physical examination of the patient and discussed management with the resident. I reviewed the resident???s note and agree with the documented findings and plan of care. Any areas of disagreement are noted on the chart. I was personally present for the key portions of any procedures. I have documented in the chart those procedures where I was not present during the key portions. I have reviewed the emergency nurses triage note. I agree with the chief complaint, past medical history, past surgical history, allergies, medications, social and family history as documented unless otherwise noted below. Documentation of the HPI, Physical Exam and Medical Decision Making performed by medical students or scribes is based on my personal performance of the HPI, PE and MDM. For Physician Assistant/ Nurse Practitioner cases/documentation I have personally evaluated this patient and have completed at least one if not all key elements of the E/M (history, physical exam, and MDM). Additional findings are as noted.    Abdomen benign, no guarding or rebound, no peritoneal signs, no pain at McBurney's point.  Several other members of his family with similar symptoms.         Sharen Counter MD  Attending Emergency  Physician            Hurshel Party, MD  09/25/12 [phone]

## 2012-09-25 NOTE — ED Notes (Signed)
Pain at an [address], [place]  09/25/12 [phone]

## 2012-09-25 NOTE — ED Notes (Signed)
Discharged to home.  Prescription for phenergan    Candee Furbish, RN  09/25/12 1538

## 2012-09-25 NOTE — Discharge Instructions (Signed)
Please read and follow all discharge instructions.  Please fill and take all prescriptions.  Follow up with a primary care physician in 3-4 days.  If symptoms worsen, please return to an Emergency Department.        Nausea and Vomiting  Nausea means you feel sick to your stomach. Throwing up (vomiting) is a reflex where stomach contents come out of your mouth.  HOME CARE    Take medicine as told by your doctor.   Do not force yourself to eat. However, you do need to drink fluids.   If you feel like eating, eat a normal diet as told by your doctor.   Eat rice, wheat, potatoes, bread, lean meats, yogurt, fruits, and vegetables.   Avoid high-fat foods.   Drink enough fluids to keep your pee (urine) clear or pale yellow.   Ask your doctor how to replace body fluid losses (rehydrate). Signs of body fluid loss (dehydration) include:   Feeling very thirsty.   Dry lips and mouth.   Feeling dizzy.   Dark pee.   Peeing less than normal.   Feeling confused.   Fast breathing or heart rate.  GET HELP RIGHT AWAY IF:    You have blood in your throw up.   You have black or bloody poop (stool).   You have a bad headache or stiff neck.   You feel confused.   You have bad belly (abdominal) pain.   You have chest pain or trouble breathing.   You do not pee at least once every 8 hours.   You have cold, clammy skin.   You keep throwing up after 24 to 48 hours.   You have a fever.  MAKE SURE YOU:    Understand these instructions.   Will watch your condition.   Will get help right away if you are not doing well or get worse.  Document Released: 12/22/2007 Document Revised: 09/27/2011 Document Reviewed: 12/04/2010  [facility] Patient Information 2013 [place], [place].

## 2013-04-06 MED ORDER — POLYETHYLENE GLYCOL 3350 17 GM/SCOOP PO POWD
17 GM/SCOOP | Freq: Every day | ORAL | Status: AC
Start: 2013-04-06 — End: 2013-05-06

## 2013-04-06 MED ORDER — AMOXICILLIN 500 MG PO CAPS
500 MG | ORAL_CAPSULE | Freq: Three times a day (TID) | ORAL | Status: AC
Start: 2013-04-06 — End: 2013-04-16

## 2013-04-06 NOTE — Patient Instructions (Addendum)
Stopping Smoking: After Your Visit  Your Care Instructions  Cigarette smokers crave the nicotine in cigarettes. Giving it up is much harder than simply changing a habit. Your body has to stop craving the nicotine. It is hard to quit, but you can do it. There are many tools that people use to quit smoking. You may find that combining tools works best for you.  There are several steps to quitting. First you get ready to quit. Then you get support to help you. After that, you learn new skills and behaviors to become a nonsmoker. For many people, a necessary step is getting and using medicine.  Your doctor will help you set up the plan that best meets your needs. You may want to attend a smoking cessation program to help you quit smoking. When you choose a program, look for one that has proven success. Ask your doctor for ideas. You will greatly increase your chances of success if you take medicine as well as get counseling or join a cessation program.  Some of the changes you feel when you first quit tobacco are uncomfortable. Your body will miss the nicotine at first, and you may feel short-tempered and grumpy. You may have trouble sleeping or concentrating. Medicine can help you deal with these symptoms. You may struggle with changing your smoking habits and rituals. The last step is the tricky one: Be prepared for the smoking urge to continue for a time. This is a lot to deal with, but keep at it. You will feel better.  Follow-up care is a key part of your treatment and safety. Be sure to make and go to all appointments, and call your doctor if you are having problems. It???s also a good idea to know your test results and keep a list of the medicines you take.  How can you care for yourself at home?  ?? Ask your family, friends, and coworkers for support. You have a better chance of quitting if you have help and support.  ?? Join a support group, such as Nicotine Anonymous, for people who are trying to quit  smoking.  ?? Consider signing up for a smoking cessation program, such as the American Lung Association's Freedom from Smoking program.  ?? Set a quit date. Pick your date carefully so that it is not right in the middle of a big deadline or stressful time. Once you quit, do not even take a puff. Get rid of all ashtrays and lighters after your last cigarette. Clean your house and your clothes so that they do not smell of smoke.  ?? Learn how to be a nonsmoker. Think about ways you can avoid those things that make you reach for a cigarette.  ?? Avoid situations that put you at greatest risk for smoking. For some people, it is hard to have a drink with friends without smoking. For others, they might skip a coffee break with coworkers who smoke.  ?? Change your daily routine. Take a different route to work or eat a meal in a different place.  ?? Cut down on stress. Calm yourself or release tension by doing an activity you enjoy, such as reading a book, taking a hot bath, or gardening.  ?? Talk to your doctor or pharmacist about nicotine replacement therapy, which replaces the nicotine in your body. You still get nicotine but you do not use tobacco. Nicotine replacement products help you slowly reduce the amount of nicotine you need. These products come in several   forms, many of them available over-the-counter:  ?? Nicotine patches  ?? Nicotine gum and lozenges  ?? Nicotine inhaler  ?? Ask your doctor about bupropion (Wellbutrin) or varenicline (Chantix), which are prescription medicines. They do not contain nicotine. They help you by reducing withdrawal symptoms, such as stress and anxiety.  ?? Some people find hypnosis, acupuncture, and massage helpful for ending the smoking habit.  ?? Eat a healthy diet and get regular exercise. Having healthy habits will help your body move past its craving for nicotine.  ?? Be prepared to keep trying. Most people are not successful the first few times they try to quit. Do not get mad at yourself  if you smoke again. Make a list of things you learned and think about when you want to try again, such as next week, next month, or next year.   Where can you learn more?   Go to [email or web address] and sign in to your MyChart account. Enter Y522 in the Search Health Information box to learn more about ???Stopping Smoking: After Your Visit.???    If you do not have an account, please click on the ???Sign Up Now??? link.     ?? 2006-2014 [employer]. Care instructions adapted under license by [facility]. This care instruction is for use with your licensed healthcare professional. If you have questions about a medical condition or this instruction, always ask your healthcare professional. [employer] disclaims any warranty or liability for your use of this information.  Content Version: 10.1.311062; Current as of: February 22, 2012              Stopping Smoking: After Your Visit  Your Care Instructions  Cigarette smokers crave the nicotine in cigarettes. Giving it up is much harder than simply changing a habit. Your body has to stop craving the nicotine. It is hard to quit, but you can do it. There are many tools that people use to quit smoking. You may find that combining tools works best for you.  There are several steps to quitting. First you get ready to quit. Then you get support to help you. After that, you learn new skills and behaviors to become a nonsmoker. For many people, a necessary step is getting and using medicine.  Your doctor will help you set up the plan that best meets your needs. You may want to attend a smoking cessation program to help you quit smoking. When you choose a program, look for one that has proven success. Ask your doctor for ideas. You will greatly increase your chances of success if you take medicine as well as get counseling or join a cessation program.  Some of the changes you feel when you first quit tobacco are uncomfortable. Your  body will miss the nicotine at first, and you may feel short-tempered and grumpy. You may have trouble sleeping or concentrating. Medicine can help you deal with these symptoms. You may struggle with changing your smoking habits and rituals. The last step is the tricky one: Be prepared for the smoking urge to continue for a time. This is a lot to deal with, but keep at it. You will feel better.  Follow-up care is a key part of your treatment and safety. Be sure to make and go to all appointments, and call your doctor if you are having problems. It???s also a good idea to know your test results and keep a list of the medicines you take.  How can you care for   yourself at home?  ?? Ask your family, friends, and coworkers for support. You have a better chance of quitting if you have help and support.  ?? Join a support group, such as Nicotine Anonymous, for people who are trying to quit smoking.  ?? Consider signing up for a smoking cessation program, such as the American Lung Association's Freedom from Smoking program.  ?? Set a quit date. Pick your date carefully so that it is not right in the middle of a big deadline or stressful time. Once you quit, do not even take a puff. Get rid of all ashtrays and lighters after your last cigarette. Clean your house and your clothes so that they do not smell of smoke.  ?? Learn how to be a nonsmoker. Think about ways you can avoid those things that make you reach for a cigarette.  ?? Avoid situations that put you at greatest risk for smoking. For some people, it is hard to have a drink with friends without smoking. For others, they might skip a coffee break with coworkers who smoke.  ?? Change your daily routine. Take a different route to work or eat a meal in a different place.  ?? Cut down on stress. Calm yourself or release tension by doing an activity you enjoy, such as reading a book, taking a hot bath, or gardening.  ?? Talk to your doctor or pharmacist about nicotine replacement  therapy, which replaces the nicotine in your body. You still get nicotine but you do not use tobacco. Nicotine replacement products help you slowly reduce the amount of nicotine you need. These products come in several forms, many of them available over-the-counter:  ?? Nicotine patches  ?? Nicotine gum and lozenges  ?? Nicotine inhaler  ?? Ask your doctor about bupropion (Wellbutrin) or varenicline (Chantix), which are prescription medicines. They do not contain nicotine. They help you by reducing withdrawal symptoms, such as stress and anxiety.  ?? Some people find hypnosis, acupuncture, and massage helpful for ending the smoking habit.  ?? Eat a healthy diet and get regular exercise. Having healthy habits will help your body move past its craving for nicotine.  ?? Be prepared to keep trying. Most people are not successful the first few times they try to quit. Do not get mad at yourself if you smoke again. Make a list of things you learned and think about when you want to try again, such as next week, next month, or next year.   Where can you learn more?   Go to [email or web address] and sign in to your MyChart account. Enter Y522 in the Search Health Information box to learn more about ???Stopping Smoking: After Your Visit.???    If you do not have an account, please click on the ???Sign Up Now??? link.     ?? 2006-2014 [employer]. Care instructions adapted under license by [facility]. This care instruction is for use with your licensed healthcare professional. If you have questions about a medical condition or this instruction, always ask your healthcare professional. [employer] disclaims any warranty or liability for your use of this information.  Content Version: 10.1.311062; Current as of: February 22, 2012
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
## 2013-04-06 NOTE — Progress Notes (Signed)
Have you seen any other physician or provider since your last visit? - yes - therapist    Have you had any other diagnostic tests since your last visit? -  no    Have you changed or stopped any medications since your last visit including any over-the-counter medicines, vitamins, or herbal medicines? -  no     Are you taking all your prescribed medications? -  Yes  If NO, why? - N/A    Patient Self-Management Goal for this visit    What is your goal for your visit today: see about thyroid, establish new doctor   Barriers to success: none   Plan for overcoming my barriers: N/A      Confidence: 10   Date goal set: 04/06/2013   Date expected to reach goal:  today    No health maintenance topics applied.      Medical history Review  Past Medical, Family, and Social History reviewed and does not contribute to the patient presenting condition      Patient/Caregiver verbalize understanding of medications.yes, Patient has migraines, left lower leg pain, and thyroid issues.

## 2013-07-24 LAB — CBC WITH AUTO DIFFERENTIAL
Absolute Eos #: 0.1 10*3/uL (ref 0.0–0.4)
Absolute Lymph #: 3 10*3/uL (ref 1.0–4.8)
Absolute Mono #: 0.8 10*3/uL (ref 0.1–1.2)
Basophils Absolute: 0 10*3/uL (ref 0.0–0.2)
Basophils: 0 % (ref 0–2)
Eosinophils %: 1 % (ref 1–4)
Hematocrit: 47.2 % (ref 41–53)
Hemoglobin: 15.5 g/dL (ref 13.5–17.5)
Lymphocytes: 32 % (ref 24–44)
MCH: 28.4 pg (ref 26–34)
MCHC: 32.8 g/dL (ref 31–37)
MCV: 86.4 fL (ref 80–100)
MPV: 8 fL (ref 6.0–12.0)
Monocytes: 9 % (ref 2–11)
Platelets: 250 10*3/uL (ref 140–450)
RBC: 5.46 m/uL (ref 4.5–5.9)
RDW: 13.3 % (ref 12.5–15.4)
Seg Neutrophils: 58 % (ref 36–66)
Segs Absolute: 5.4 10*3/uL (ref 1.8–7.7)
WBC: 9.4 10*3/uL (ref 3.5–11.0)

## 2013-07-24 LAB — COMPREHENSIVE METABOLIC PANEL
ALT: 34 U/L (ref 5–41)
AST: 27 U/L (ref <40)
Albumin/Globulin Ratio: 1.5 (ref 1.0–2.5)
Albumin: 4.9 g/dL (ref 3.5–5.2)
Alkaline Phosphatase: 82 U/L (ref 40–129)
Anion Gap: 16 mmol/L (ref 8–16)
BUN: 10 mg/dL (ref 6–20)
CO2: 30 mmol/L (ref 20–31)
Calcium: 9.9 mg/dL (ref 8.6–10.4)
Chloride: 98 mmol/L (ref 98–107)
Creatinine: 0.71 mg/dL (ref 0.70–1.20)
GFR African American: >60 mL/min (ref >60)
GFR Non-African American: >60 mL/min (ref >60)
Glucose: 86 mg/dL (ref 70–99)
Potassium: 4.2 mmol/L (ref 3.7–5.3)
Sodium: 140 mmol/L (ref 135–144)
Total Bilirubin: 0.4 mg/dL (ref 0.3–1.2)
Total Protein: 8.1 g/dL (ref 6.4–8.3)

## 2013-07-24 LAB — T. PALLIDUM AB: T. Pallidum Ab: NONREACTIVE

## 2013-07-24 LAB — HEPATITIS C ANTIBODY: Hepatitis C Ab: NONREACTIVE

## 2013-07-24 LAB — HEPATITIS B SURFACE ANTIGEN: Hepatitis B Surface Ag: NONREACTIVE

## 2013-10-11 NOTE — Progress Notes (Signed)
Test finalized, see chart.

## 2013-11-20 MED ORDER — SALINE NASAL SPRAY 0.65 % NA SOLN
0.65 | NASAL | Status: DC | PRN
Start: 2013-11-20 — End: 2013-12-28

## 2013-11-20 MED ORDER — GABAPENTIN 100 MG PO CAPS
100 MG | ORAL_CAPSULE | ORAL | Status: DC
Start: 2013-11-20 — End: 2013-11-23

## 2013-11-20 MED ORDER — PENICILLIN V POTASSIUM 500 MG PO TABS
500 MG | ORAL_TABLET | Freq: Four times a day (QID) | ORAL | Status: AC
Start: 2013-11-20 — End: 2013-11-30

## 2013-11-20 MED ORDER — FEXOFENADINE HCL 180 MG PO TABS
180 MG | ORAL_TABLET | Freq: Every day | ORAL | Status: DC
Start: 2013-11-20 — End: 2014-08-29

## 2013-11-20 MED ORDER — CYCLOBENZAPRINE HCL 5 MG PO TABS
5 MG | ORAL_TABLET | Freq: Three times a day (TID) | ORAL | Status: AC | PRN
Start: 2013-11-20 — End: 2013-12-20

## 2013-11-20 MED ORDER — GUAIFENESIN ER 600 MG PO TB12
600 MG | ORAL_TABLET | Freq: Two times a day (BID) | ORAL | Status: DC
Start: 2013-11-20 — End: 2014-08-29

## 2013-11-20 MED ORDER — IBUPROFEN 800 MG PO TABS
800 MG | ORAL_TABLET | Freq: Three times a day (TID) | ORAL | Status: DC | PRN
Start: 2013-11-20 — End: 2014-08-29

## 2013-11-20 MED ORDER — FLUTICASONE PROPIONATE 50 MCG/ACT NA SUSP
50 MCG/ACT | Freq: Every evening | NASAL | Status: DC
Start: 2013-11-20 — End: 2013-12-28

## 2013-11-20 NOTE — Patient Instructions (Addendum)
Eating Healthy Foods: After Your Visit  Your Care Instructions  Eating healthy foods can help lower your risk for disease. Healthy food gives you energy and keeps your heart strong, your brain active, your muscles working, and your bones strong.  A healthy diet includes a variety of foods from the basic food groups: grains, vegetables, fruits, milk and milk products, and meat and beans. Some people may eat more of their favorite foods from only one food group and, as a result, miss getting the nutrients they need. So, it is important to pay attention not only to what you eat but also to what you are missing from your diet. You can eat a healthy, balanced diet by making a few small changes.  Follow-up care is a key part of your treatment and safety. Be sure to make and go to all appointments, and call your doctor if you are having problems. It???s also a good idea to know your test results and keep a list of the medicines you take.  How can you care for yourself at home?  Look at what you eat  ?? Keep a food diary for a week or two and record everything you eat or drink. Track the number of servings you eat from each food group.  ?? For a balanced diet every day, eat a variety of:  ?? 6 or more ounce-equivalents of grains, such as cereals, breads, crackers, rice, or pasta, every day. An ounce-equivalent is 1 slice of bread, 1 cup of ready-to-eat cereal, or ?? cup of cooked rice, cooked pasta, or cooked cereal.  ?? 2?? cups of vegetables, especially:  ?? Dark-green vegetables such as broccoli and spinach.  ?? Orange vegetables such as carrots and sweet potatoes.  ?? Dry beans (such as pinto and kidney beans) and peas (such as lentils).  ?? 2 cups of fresh, frozen, or canned fruit. A small apple or 1 banana or orange equals 1 cup.  ?? 3 cups of nonfat or low-fat milk, yogurt, or other milk products.  ?? 5?? ounces of meat and beans, such as chicken, fish, lean meat, beans, nuts, and seeds. One egg, 1 tablespoon of peanut butter, ??  ounce nuts or seeds, or ?? cup of cooked beans equals 1 ounce of meat.  ?? Learn how to read food labels for serving sizes and ingredients. Fast-food and convenience-food meals often contain few or no fruits or vegetables. Make sure you eat some fruits and vegetables to make the meal more nutritious.  ?? Look at your food diary. For each food group, add up what you have eaten and then divide the total by the number of days. This will give you an idea of how much you are eating from each food group. See if you can find some ways to change your diet to make it more healthy.  Start small  ?? Do not try to make dramatic changes to your diet all at once. You might feel that you are missing out on your favorite foods and then be more likely to fail.  ?? Start slowly, and gradually change your habits. Try some of the following:  ?? Use whole wheat bread instead of white bread.  ?? Use nonfat or low-fat milk instead of whole milk.  ?? Eat brown rice instead of white rice, and eat whole wheat pasta instead of white-flour pasta.  ?? Try low-fat cheeses and low-fat yogurt.  ?? Add more fruits and vegetables to meals and have them for snacks.  ??   Add lettuce, tomato, cucumber, and onion to sandwiches.  ?? Add fruit to yogurt and cereal.  Enjoy food  ?? You can still eat your favorite foods. You just may need to eat less of them. If your favorite foods are high in fat, salt, and sugar, limit how often you eat them, but do not cut them out entirely.  ?? Eat a wide variety of foods.  Make healthy choices when eating out  1. The type of restaurant you choose can help you make healthy choices. Even fast-food chains are now offering more low-fat or healthier choices on the menu.  2. Choose smaller portions, or take half of your meal home.  3. When eating out, try:  1. A veggie pizza with a whole wheat crust or grilled chicken (instead of sausage or pepperoni).  2. Pasta with roasted vegetables, grilled chicken, or marinara sauce instead of cream  sauce.  3. A vegetable wrap or grilled chicken wrap.  4. Broiled or poached food instead of fried or breaded items.  Make healthy choices easy  ?? Buy packaged, prewashed, ready-to-eat fresh vegetables and fruits, such as baby carrots, salad mixes, and chopped or shredded broccoli and cauliflower.  ?? Buy packaged, presliced fruits, such as melon or pineapple.  ?? Choose 100% fruit or vegetable juice instead of soda. Limit juice intake to 4 to 6 oz (?? to ?? cup) a day.  ?? Blend low-fat yogurt, fruit juice, and canned or frozen fruit to make a smoothie for breakfast or a snack.   Where can you learn more?   Go to [email or web address] and sign in to your MyChart account. Enter [phone] in the Search Health Information box to learn more about ???Eating Healthy Foods: After Your Visit.???    If you do not have an account, please click on the ???Sign Up Now??? link.     ?? 2006-2015 [employer]. Care instructions adapted under license by [facility]. This care instruction is for use with your licensed healthcare professional. If you have questions about a medical condition or this instruction, always ask your healthcare professional. Health[employer], Inc[employer] disclaims any warranty or liability for your use of this information.  Content Version: 10.4.390249; Current as of: June 01, 2013              Back Stretches: Exercises  Your Care Instructions  Here are some examples of exercises for stretching your back. Start each exercise slowly. Ease off the exercise if you start to have pain.  Your doctor or physical therapist will tell you when you can start these exercises and which ones will work best for you.  How to do the exercises  Overhead stretch    ?? Stand comfortably with your feet shoulder-width apart.  ?? Looking straight ahead, raise both arms over your head and reach toward the ceiling. Do not allow your head to tilt back.  ?? Hold for 15 to 30 seconds, then lower your arms to your  sides.  ?? Repeat 2 to 4 times.  Side stretch    ?? Stand comfortably with your feet shoulder-width apart.  ?? Raise one arm over your head, and then lean to the other side.  ?? Slide your hand down your leg as you let the weight of your arm gently stretch your side muscles. Hold for 15 to 30 seconds.  ?? Repeat 2 to 4 times on each side.  Press-up    ?? Lie on your stomach, supporting your body with your forearms.  ??  Press your elbows down into the floor to raise your upper back. As you do this, relax your stomach muscles and allow your back to arch without using your back muscles. As your press up, do not let your hips or pelvis come off the floor.  ?? Hold for 15 to 30 seconds, then relax.  ?? Repeat 2 to 4 times.  Relax and rest    4. Lie on your back with a rolled towel under your neck and a pillow under your knees. Extend your arms comfortably to your sides.  5. Relax and breathe normally.  6. Remain in this position for about 10 minutes.  7. If you can, do this 2 or 3 times each day.  Follow-up care is a key part of your treatment and safety. Be sure to make and go to all appointments, and call your doctor if you are having problems. It's also a good idea to know your test results and keep a list of the medicines you take.   Where can you learn more?   Go to [email or web address] and sign in to your MyChart account. Enter [id number] in the Search Health Information box to learn more about ???Back Stretches: Exercises.???    If you do not have an account, please click on the ???Sign Up Now??? link.     ?? 2006-2015 [employer]. Care instructions adapted under license by [facility]. This care instruction is for use with your licensed healthcare professional. If you have questions about a medical condition or this instruction, always ask your healthcare professional. [employer], [employer] disclaims any warranty or liability for your use of this information.  Content Version: 10.4.390249; Current as  of: June 01, 2013              Sinusitis: After Your Visit  Your Care Instructions     Sinusitis is an infection of the lining of the sinus cavities in your head. Sinusitis often follows a cold. It causes pain and pressure in your head and face.  In most cases, sinusitis gets better on its own in 1 to 2 weeks. But some mild symptoms may last for several weeks. Sometimes antibiotics are needed.  Follow-up care is a key part of your treatment and safety. Be sure to make and go to all appointments, and call your doctor if you are having problems. It's also a good idea to know your test results and keep a list of the medicines you take.  How can you care for yourself at home?  ?? Take an over-the-counter pain medicine, such as acetaminophen (Tylenol), ibuprofen (Advil, Motrin), or naproxen (Aleve). Read and follow all instructions on the label.  ?? If the doctor prescribed antibiotics, take them as directed. Do not stop taking them just because you feel better. You need to take the full course of antibiotics.  ?? Be careful when taking over-the-counter cold or flu medicines and Tylenol at the same time. Many of these medicines have acetaminophen, which is Tylenol. Read the labels to make sure that you are not taking more than the recommended dose. Too much acetaminophen (Tylenol) can be harmful.  ?? Breathe warm, moist air from a steamy shower, a hot bath, or a sink filled with hot water. Avoid cold, dry air. Using a humidifier in your home may help. Follow the directions for cleaning the machine.  ?? Use saline (saltwater) nasal washes to help keep your nasal passages open and wash out mucus and bacteria. You can buy saline nose  drops at a grocery store or drugstore. Or you can make your own at home by adding 1 teaspoon of salt and 1 teaspoon of baking soda to 2 cups of distilled water. If you make your own, fill a bulb syringe with the solution, insert the tip into your nostril, and squeeze gently. Blow your  nose.  ?? Put a hot, wet towel or a warm gel pack on your face 3 or 4 times a day for 5 to 10 minutes each time.  ?? Try a decongestant nasal spray like oxymetazoline (Afrin). Do not use it for more than 3 days in a row. Using it for more than 3 days can make your congestion worse.  When should you call for help?  Call your doctor now or seek immediate medical care if:  ?? You have new or worse swelling or redness in your face or around your eyes.  ?? You have a new or higher fever.  Watch closely for changes in your health, and be sure to contact your doctor if:  ?? You have new or worse facial pain.  ?? The mucus from your nose becomes thicker (like pus) or has new blood in it.  ?? You are not getting better as expected.   Where can you learn more?   Go to [email or web address] and sign in to your MyChart account. Enter [phone] [id number] in the Search Health Information box to learn more about ???Sinusitis: After Your Visit.???    If you do not have an account, please click on the ???Sign Up Now??? link.     ?? 2006-2015 [employer]. Care instructions adapted under license by [facility]. This care instruction is for use with your licensed healthcare professional. If you have questions about a medical condition or this instruction, always ask your healthcare professional. [employer] disclaims any warranty or liability for your use of this information.  Content Version: 10.4.390249; Current as of: June 01, 2013
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
## 2013-11-20 NOTE — Progress Notes (Signed)
Have you seen any other physician or provider since your last visit? - no    Have you had any other diagnostic tests since your last visit? -  no    Have you changed or stopped any medications since your last visit including any over-the-counter medicines, vitamins, or herbal medicines? -  no     Are you taking all your prescribed medications? -  Yes  If NO, why? - N/A    Patient Self-Management Goal for this visit    What is your goal for your visit today: dizzy spells, back and neck pain,    Barriers to success: none   Plan for overcoming my barriers: N/A      Confidence: 10/10   Date goal set: 11/20/2013   Date expected to reach goal:  today    Health Maintenance Due   Topic Date Due   ??? TETANUS VACCINE ADULT (11 YEARS AND UP)  09/23/1995         Medical history Review  Past Medical, Family, and Social History reviewed and does not contribute to the patient presenting condition      Patient/Caregiver verbalize understanding of medications.yes

## 2013-11-23 ENCOUNTER — Telehealth

## 2013-11-23 MED ORDER — GABAPENTIN 300 MG PO CAPS
300 MG | ORAL_CAPSULE | Freq: Three times a day (TID) | ORAL | Status: DC
Start: 2013-11-23 — End: 2013-11-30

## 2013-11-23 NOTE — Telephone Encounter (Signed)
Patient requesting increased Gabapentin dose, per his girlfriend

## 2013-11-26 NOTE — Telephone Encounter (Signed)
Pt was started on Neurontin 300 tid on Friday and said it is not helping, he is asking for higher dose or should he give it more time to work ?

## 2013-11-26 NOTE — Telephone Encounter (Signed)
Discussed instructions with patient.

## 2013-11-26 NOTE — Telephone Encounter (Addendum)
Can give it more time, about 1 week.  Also, needs to take Flexeril every 8 hours.advise to call me back on Friday

## 2013-11-27 ENCOUNTER — Telehealth

## 2013-11-27 NOTE — Telephone Encounter (Signed)
Selena BattenCody called stating that patient was started on Neurontin 300 mg TID, he is also taking Flexeril, Ibuprofen.  Selena BattenCody states that he is not having much relief.  And wants to know what to do.     Please Advise

## 2013-11-27 NOTE — Telephone Encounter (Signed)
Discussed instructions with girlfriend and faxed xray to [facility]

## 2013-11-27 NOTE — Telephone Encounter (Signed)
Start PT, I reprinted the referral  Do Xray lumbar, I just ordered it  Schedule with me on Friday, in the morning

## 2013-11-30 ENCOUNTER — Telehealth

## 2013-11-30 MED ORDER — GABAPENTIN 600 MG PO TABS
600 MG | ORAL_TABLET | Freq: Three times a day (TID) | ORAL | Status: DC
Start: 2013-11-30 — End: 2013-12-21

## 2013-11-30 NOTE — Telephone Encounter (Signed)
Unable to leave message for patient.

## 2013-11-30 NOTE — Telephone Encounter (Signed)
PLEASE LET PATIENT KNOW TO PICK UP.  I increased Neurontin as below. Can take 2 x 300 mg that he still has. Needs to keep appointment, we can try other medications      1. Chronic low back pain  - gabapentin (NEURONTIN) 600 MG tablet; Take 1 tablet by mouth 3 times daily  Dispense: 90 tablet; Refill: 0    [id number] - [place], [place] - [address] - P [phone] F [phone]

## 2013-11-30 NOTE — Telephone Encounter (Signed)
Pt cancelled appt. Today , but wanted you to know that the Neurontin is not working , he  resched. For 12/03/13

## 2013-11-30 NOTE — Telephone Encounter (Signed)
Again, unable to leave a message for the patient.

## 2013-12-11 NOTE — Telephone Encounter (Signed)
Mix and match, for 800 mg TID.  No new RX for Neurontin will be given to him

## 2013-12-11 NOTE — Telephone Encounter (Signed)
Please check with pharmacy if patient picked up all 3 RX for Gabapentin given as follows:    11/30/13-Gabapentin 600 mg-90 caps  11/23/13-Gabapentin 300 mg-90 caps  11/20/13-Gabapentin 100 mg - 90 caps    Thank you!  Giordano Getman

## 2013-12-11 NOTE — Telephone Encounter (Signed)
Girl friend notified and reminded of June appt

## 2013-12-11 NOTE — Telephone Encounter (Signed)
confirmed c pharm that pt did pick up all 270 of the various strengths of Neurontin. Do you want him to mix and match to get 800 tid or what do yu advise?

## 2013-12-11 NOTE — Telephone Encounter (Signed)
Patients wife Codi called and asked if you would increase patients  Neurontin to 800 mg. And send to pharmacy.

## 2013-12-21 ENCOUNTER — Telehealth

## 2013-12-21 MED ORDER — GABAPENTIN 600 MG PO TABS
600 MG | ORAL_TABLET | Freq: Three times a day (TID) | ORAL | Status: DC
Start: 2013-12-21 — End: 2013-12-21

## 2013-12-21 NOTE — Telephone Encounter (Signed)
Spoke with Maralyn Sago at [facility] and cancelled the prescription for Gabapentin.

## 2013-12-21 NOTE — Telephone Encounter (Signed)
PER  NOTES BELOW, PATIENT RECEIVED AND PICKED UP 3 RX OF GABAPENTIN OF INCREASED STRENGTH IN MAY. PATIENT NO SHOWED MAY 18. PER MY MATH, PATIENT SHOULD STILL HAVE SOME LEFT. YES, I DID INCREASE THE STRENGTH TO 800 MG TID. COULD BE RESCHEDULED EARLIER, IF ANY OPENINGS.  11/30/13-Gabapentin 600 mg-90 caps  11/23/13-Gabapentin 300 mg-90 caps  11/20/13-Gabapentin 100 mg - 90 caps    Georga Hacking Mag at 12/11/2013 11:24 AM    Status: Signed        Expand All Collapse All   Girl friend notified and reminded of June appt                Cherylann Ratel, MD at 12/11/2013 11:22 AM    Status: Signed        Expand All Collapse All   Mix and match, for 800 mg TID.  No new RX for Neurontin will be given to him                Linna Hoff at 12/11/2013 11:02 AM    Status: Signed        Expand All Collapse All   confirmed c pharm that pt did pick up all 270 of the various strengths of Neurontin. Do you want him to mix and match to get 800 tid or what do yu advise?                Cherylann Ratel, MD at 12/11/2013 9:09 AM    Status: Signed        Expand All Collapse All   Please check with pharmacy if patient picked up all 3 RX for Gabapentin given as follows:    11/30/13-Gabapentin 600 mg-90 caps  11/23/13-Gabapentin 300 mg-90 caps  11/20/13-Gabapentin 100 mg - 90 caps    Thank you!  Leeba Barbe

## 2013-12-21 NOTE — Telephone Encounter (Signed)
Looking back at all of the notes and phone messages it is confusing, I am going to cancel the Gabapentin script and will leave the message for Dr. Troy Sine to address next week when she is back! Please forward this message back to Dr. Troy Sine.

## 2013-12-21 NOTE — Telephone Encounter (Signed)
Please telephone from 12-11-13.     Pateint's girlfriend is stating that he does not have any gabapentin at home and needs a prescription for the 600mg .   She states that patient is to be on 800 mg TID and he had a prescription filled on 11-30-13 for the 600 mg of gabapentin (90) and had gabapentin 300 mg filled on 11-23-13 (90).   Patient has an appointment with Dr Troy Sine on 01-15-14.

## 2013-12-21 NOTE — Telephone Encounter (Addendum)
Noted, Dr. Troy Sine please address on Monday.

## 2013-12-21 NOTE — Telephone Encounter (Signed)
Will order the Gabapentin 600mg  TID #90 with no refills and have him keep appt with Dr. Troy Sine the end of the month to discuss his dosing!

## 2013-12-24 NOTE — Telephone Encounter (Signed)
Noted!Thank you!  David [name]

## 2013-12-24 NOTE — Telephone Encounter (Signed)
Patient made appointment for this Thursday, 12/27/13. Will discuss this at appointment.

## 2013-12-28 MED ORDER — VENLAFAXINE HCL ER 37.5 MG PO CP24
37.5 MG | ORAL_CAPSULE | Freq: Every morning | ORAL | Status: DC
Start: 2013-12-28 — End: 2014-08-29

## 2013-12-28 MED ORDER — CYCLOBENZAPRINE HCL 10 MG PO TABS
10 MG | ORAL_TABLET | Freq: Three times a day (TID) | ORAL | Status: AC | PRN
Start: 2013-12-28 — End: 2014-01-27

## 2013-12-28 MED ORDER — GABAPENTIN 800 MG PO TABS
800 MG | ORAL_TABLET | Freq: Three times a day (TID) | ORAL | Status: DC
Start: 2013-12-28 — End: 2014-01-25

## 2013-12-28 MED ORDER — CLINDAMYCIN HCL 300 MG PO CAPS
300 MG | ORAL_CAPSULE | Freq: Three times a day (TID) | ORAL | Status: AC
Start: 2013-12-28 — End: 2014-01-07

## 2013-12-28 NOTE — Progress Notes (Signed)
Have you seen any other physician or provider since your last visit? - no    Have you had any other diagnostic tests since your last visit? -  no    Have you changed or stopped any medications since your last visit including any over-the-counter medicines, vitamins, or herbal medicines? -  yes - nasal sprays    Are you taking all your prescribed medications? -  Yes  If NO, why? - N/A    Patient Self-Management Goal for this visit    What is your goal for your visit today: back, neck pain, mouth issue   Barriers to success: none   Plan for overcoming my barriers: N/A      Confidence: 10/10   Date goal set: 12/28/2013   Date expected to reach goal:  today    There are no preventive care reminders to display for this patient.      Medical history Review  Past Medical, Family, and Social History reviewed and does not contribute to the patient presenting condition      Patient/Caregiver verbalize understanding of medications.yes
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
## 2013-12-28 NOTE — Patient Instructions (Addendum)
Eating Healthy Foods: After Your Visit  Your Care Instructions  Eating healthy foods can help lower your risk for disease. Healthy food gives you energy and keeps your heart strong, your brain active, your muscles working, and your bones strong.  A healthy diet includes a variety of foods from the basic food groups: grains, vegetables, fruits, milk and milk products, and meat and beans. Some people may eat more of their favorite foods from only one food group and, as a result, miss getting the nutrients they need. So, it is important to pay attention not only to what you eat but also to what you are missing from your diet. You can eat a healthy, balanced diet by making a few small changes.  Follow-up care is a key part of your treatment and safety. Be sure to make and go to all appointments, and call your doctor if you are having problems. It???s also a good idea to know your test results and keep a list of the medicines you take.  How can you care for yourself at home?  Look at what you eat  ?? Keep a food diary for a week or two and record everything you eat or drink. Track the number of servings you eat from each food group.  ?? For a balanced diet every day, eat a variety of:  ?? 6 or more ounce-equivalents of grains, such as cereals, breads, crackers, rice, or pasta, every day. An ounce-equivalent is 1 slice of bread, 1 cup of ready-to-eat cereal, or ?? cup of cooked rice, cooked pasta, or cooked cereal.  ?? 2?? cups of vegetables, especially:  ?? Dark-green vegetables such as broccoli and spinach.  ?? Orange vegetables such as carrots and sweet potatoes.  ?? Dry beans (such as pinto and kidney beans) and peas (such as lentils).  ?? 2 cups of fresh, frozen, or canned fruit. A small apple or 1 banana or orange equals 1 cup.  ?? 3 cups of nonfat or low-fat milk, yogurt, or other milk products.  ?? 5?? ounces of meat and beans, such as chicken, fish, lean meat, beans, nuts, and seeds. One egg, 1 tablespoon of peanut butter, ??  ounce nuts or seeds, or ?? cup of cooked beans equals 1 ounce of meat.  ?? Learn how to read food labels for serving sizes and ingredients. Fast-food and convenience-food meals often contain few or no fruits or vegetables. Make sure you eat some fruits and vegetables to make the meal more nutritious.  ?? Look at your food diary. For each food group, add up what you have eaten and then divide the total by the number of days. This will give you an idea of how much you are eating from each food group. See if you can find some ways to change your diet to make it more healthy.  Start small  ?? Do not try to make dramatic changes to your diet all at once. You might feel that you are missing out on your favorite foods and then be more likely to fail.  ?? Start slowly, and gradually change your habits. Try some of the following:  ?? Use whole wheat bread instead of white bread.  ?? Use nonfat or low-fat milk instead of whole milk.  ?? Eat brown rice instead of white rice, and eat whole wheat pasta instead of white-flour pasta.  ?? Try low-fat cheeses and low-fat yogurt.  ?? Add more fruits and vegetables to meals and have them for snacks.  ??   Add lettuce, tomato, cucumber, and onion to sandwiches.  ?? Add fruit to yogurt and cereal.  Enjoy food  ?? You can still eat your favorite foods. You just may need to eat less of them. If your favorite foods are high in fat, salt, and sugar, limit how often you eat them, but do not cut them out entirely.  ?? Eat a wide variety of foods.  Make healthy choices when eating out  ?? The type of restaurant you choose can help you make healthy choices. Even fast-food chains are now offering more low-fat or healthier choices on the menu.  ?? Choose smaller portions, or take half of your meal home.  ?? When eating out, try:  ?? A veggie pizza with a whole wheat crust or grilled chicken (instead of sausage or pepperoni).  ?? Pasta with roasted vegetables, grilled chicken, or marinara sauce instead of cream  sauce.  ?? A vegetable wrap or grilled chicken wrap.  ?? Broiled or poached food instead of fried or breaded items.  Make healthy choices easy  ?? Buy packaged, prewashed, ready-to-eat fresh vegetables and fruits, such as baby carrots, salad mixes, and chopped or shredded broccoli and cauliflower.  ?? Buy packaged, presliced fruits, such as melon or pineapple.  ?? Choose 100% fruit or vegetable juice instead of soda. Limit juice intake to 4 to 6 oz (?? to ?? cup) a day.  ?? Blend low-fat yogurt, fruit juice, and canned or frozen fruit to make a smoothie for breakfast or a snack.   Where can you learn more?   Go to [email or web address] and sign in to your MyChart account. Enter [id number] in the Search Health Information box to learn more about ???Eating Healthy Foods: After Your Visit.???    If you do not have an account, please click on the ???Sign Up Now??? link.     ?? 2006-2015 [employer]. Care instructions adapted under license by [facility]. This care instruction is for use with your licensed healthcare professional. If you have questions about a medical condition or this instruction, always ask your healthcare professional. [employer] disclaims any warranty or liability for your use of this information.  Content Version: 10.4.390249; Current as of: June 01, 2013              Anxiety Disorder: After Your Visit  Your Care Instructions  Anxiety is a normal reaction to stress. Difficult situations can cause you to have symptoms such as sweaty palms and a nervous feeling.  In an anxiety disorder, the symptoms are far more severe. Constant worry, muscle tension, trouble sleeping, nausea and diarrhea, and other symptoms can make normal daily activities difficult or impossible. These symptoms may occur for no reason, and they can affect your work, school, or social life. Medicines, counseling, and self-care can all help.  Follow-up care is a key part of your treatment and safety. Be sure  to make and go to all appointments, and call your doctor if you are having problems. It's also a good idea to know your test results and keep a list of the medicines you take.  How can you care for yourself at home?  ?? Take medicines exactly as directed. Call your doctor if you think you are having a problem with your medicine.  ?? Go to your counseling sessions and follow-up appointments.  ?? Recognize and accept your anxiety. Then, when you are in a situation that makes you anxious, say to yourself, "This is not an emergency.  I feel uncomfortable, but I am not in danger. I can keep going even if I feel anxious."  ?? Be kind to your body:  ?? Relieve tension with exercise or a massage.  ?? Get enough rest.  ?? Avoid alcohol, caffeine, nicotine, and illegal drugs. They can increase your anxiety level and cause sleep problems.  ?? Learn and do relaxation techniques. See below for more about these techniques.  ?? Engage your mind. Get out and do something you enjoy. Go to a funny movie, or take a walk or hike. Plan your day. Having too much or too little to do can make you anxious.  ?? Keep a record of your symptoms. Discuss your fears with a good friend or family member, or join a support group for people with similar problems. Talking to others sometimes relieves stress.  ?? Get involved in social groups, or volunteer to help others. Being alone sometimes makes things seem worse than they are.  ?? Get at least 30 minutes of exercise on most days of the week to relieve stress. Walking is a good choice. You also may want to do other activities, such as running, swimming, cycling, or playing tennis or team sports.  Relaxation techniques  Do relaxation exercises 10 to 20 minutes a day. You can play soothing, relaxing music while you do them, if you wish.  ?? Tell others in your house that you are going to do your relaxation exercises. Ask them not to disturb you.  ?? Find a comfortable place, away from all distractions and  noise.  ?? Lie down on your back, or sit with your back straight.  ?? Focus on your breathing. Make it slow and steady.  ?? Breathe in through your nose. Breathe out through either your nose or mouth.  ?? Breathe deeply, filling up the area between your navel and your rib cage. Breathe so that your belly goes up and down.  ?? Do not hold your breath.  ?? Breathe like this for 5 to 10 minutes. Notice the feeling of calmness throughout your whole body.  As you continue to breathe slowly and deeply, relax by doing the following for another 5 to 10 minutes:  ?? Tighten and relax each muscle group in your body. You can begin at your toes and work your way up to your head.  ?? Imagine your muscle groups relaxing and becoming heavy.  ?? Empty your mind of all thoughts.  ?? Let yourself relax more and more deeply.  ?? Become aware of the state of calmness that surrounds you.  ?? When your relaxation time is over, you can bring yourself back to alertness by moving your fingers and toes and then your hands and feet and then stretching and moving your entire body. Sometimes people fall asleep during relaxation, but they usually wake up shortly afterward.  ?? Always give yourself time to return to full alertness before you drive a car or do anything that might cause an accident if you are not fully alert. Never play a relaxation tape while you drive a car.  When should you call for help?  Call 911 anytime you think you may need emergency care. For example, call if:  ?? You feel you cannot stop from hurting yourself or someone else.  Watch closely for changes in your health, and be sure to contact your doctor if:  ?? You have anxiety or fear that affects your life.  ?? You have symptoms of anxiety that  are new or different from those you had before.   Where can you learn more?   Go to [email or web address] and sign in to your MyChart account. Enter P754 in the Search Health Information box to learn more about ???Anxiety  Disorder: After Your Visit.???    If you do not have an account, please click on the ???Sign Up Now??? link.     ?? 2006-2015 [employer]. Care instructions adapted under license by [facility]. This care instruction is for use with your licensed healthcare professional. If you have questions about a medical condition or this instruction, always ask your healthcare professional. [employer] disclaims any warranty or liability for your use of this information.  Content Version: 10.4.390249; Current as of: June 01, 2013              Abscessed Tooth: After Your Child's Visit  Your Care Instructions     An abscessed tooth is a tooth that has a pocket of pus in the tissues around it. Pus forms when the body tries to fight an infection caused by bacteria. If the pus cannot drain, it forms an abscess. An abscessed tooth can cause red, swollen gums and throbbing pain, especially when your child chews. Your child may have a bad taste in his or her mouth and a fever, and your child's jaw may swell.  Damage to the tooth, untreated tooth decay, or gum disease can cause an abscessed tooth.  An abscessed tooth needs to be treated by a dental professional right away. If it is not treated, the infection could spread to other parts of your child's body. A dentist will give your child antibiotics to stop the infection. He or she may make a hole in the tooth or cut open (lance) the abscess inside your child's mouth so that the infection can drain, which should relieve your child's pain. Your child may need to have a root canal treatment, which tries to save the tooth by taking out the infected pulp and replacing it with a healing medicine and/or a filling. If these treatments do not work, the dentist may have to remove the tooth.  Follow-up care is a key part of your child's treatment and safety. Be sure to make and go to all appointments, and call your doctor if your child is having problems. It's also a good  idea to know your child's test results and keep a list of the medicines your child takes.  How can you care for your child at home?  ?? Reduce pain and swelling in your child's face and jaw by putting ice or a cold pack on the outside of your child's cheek for 10 to 20 minutes at a time. Put a thin cloth between the ice and your child's skin.  ?? Give pain medicines exactly as directed.  ?? If the doctor gave your child a prescription medicine for pain, give it as prescribed.  ?? If your child is not taking a prescription pain medicine, ask your doctor if your child can take an over-the-counter medicine.  ?? Give your child antibiotics as directed. Do not stop using them just because your child feels better. Your child needs to take the full course of antibiotics.  To prevent tooth abscess  ?? Have your child brush and floss every day and get regular dental checkups.  ?? Give your child a healthy diet, and avoid sugary foods and drinks.  When should you call for help?  Call 911 anytime you  think your child may need emergency care. For example, call if:  ?? Your child has trouble breathing.  Call your doctor now or seek immediate medical care if:  ?? Your child is dizzy or lightheaded or feels like he or she may faint.  ?? Your child has a new or higher fever.  ?? Your child has swelling, redness, or pain that spreads or gets worse.  ?? Your child has pus coming from the tooth area.  ?? Your child develops a rash.  ?? Your child has an earache or pain behind the ear.  ?? Your child has a fever with a stiff neck or a severe headache.  ?? Your child is sensitive to light or feels very sleepy or confused.  ?? Your child has changes in vision.  ?? Your child has a severe toothache that has not improved after an hour or two of home treatment.  Watch closely for changes in your child's health, and be sure to contact your doctor if:  ?? Your child does not get better as expected.   Where can you learn more?   Go to  [email or web address] and sign in to your MyChart account. Enter [id number] in the Search Health Information box to learn more about ???Abscessed Tooth: After Your Child's Visit.???    If you do not have an account, please click on the ???Sign Up Now??? link.     ?? 2006-2015 [employer]. Care instructions adapted under license by [facility]. This care instruction is for use with your licensed healthcare professional. If you have questions about a medical condition or this instruction, always ask your healthcare professional. [employer] disclaims any warranty or liability for your use of this information.  Content Version: 10.4.390249; Current as of: June 01, 2013

## 2014-01-15 NOTE — Telephone Encounter (Signed)
Tried to call Mr David [name] to let him know he was a No Show this morning, and to re schedule, His VM is not set up yet, will follow up with a No Show letter

## 2014-01-15 NOTE — Telephone Encounter (Signed)
Noted.  Thank you!  David [name]

## 2014-01-25 ENCOUNTER — Encounter

## 2014-01-25 MED ORDER — GABAPENTIN 800 MG PO TABS
800 MG | ORAL_TABLET | Freq: Three times a day (TID) | ORAL | Status: DC
Start: 2014-01-25 — End: 2014-02-21

## 2014-02-21 MED ORDER — GABAPENTIN 800 MG PO TABS
800 MG | ORAL_TABLET | ORAL | Status: DC
Start: 2014-02-21 — End: 2014-03-22

## 2014-03-22 ENCOUNTER — Telehealth

## 2014-03-22 MED ORDER — GABAPENTIN 800 MG PO TABS
800 MG | ORAL_TABLET | ORAL | Status: DC
Start: 2014-03-22 — End: 2014-04-15

## 2014-03-22 NOTE — Telephone Encounter (Signed)
Needs appointment in 2-3 weeks

## 2014-03-26 NOTE — Telephone Encounter (Signed)
Left message for patient to call back.

## 2014-04-15 ENCOUNTER — Encounter

## 2014-04-15 MED ORDER — GABAPENTIN 800 MG PO TABS
800 MG | ORAL_TABLET | ORAL | Status: DC
Start: 2014-04-15 — End: 2014-05-14

## 2014-05-14 ENCOUNTER — Encounter

## 2014-05-14 MED ORDER — GABAPENTIN 800 MG PO TABS
800 MG | ORAL_TABLET | ORAL | Status: DC
Start: 2014-05-14 — End: 2014-06-10

## 2014-05-14 NOTE — Telephone Encounter (Signed)
noted  Thank you!  David [name]

## 2014-05-14 NOTE — Telephone Encounter (Signed)
FYI  Pt is sched PT and U/S, he had death in family and did not get them done.

## 2014-06-10 ENCOUNTER — Encounter

## 2014-06-10 MED ORDER — GABAPENTIN 800 MG PO TABS
800 MG | ORAL_TABLET | ORAL | Status: DC
Start: 2014-06-10 — End: 2014-07-30

## 2014-07-30 ENCOUNTER — Encounter

## 2014-07-30 MED ORDER — GABAPENTIN 800 MG PO TABS
800 MG | ORAL_TABLET | ORAL | Status: DC
Start: 2014-07-30 — End: 2014-08-28

## 2014-08-28 ENCOUNTER — Encounter: Attending: Family Medicine | Primary: Family

## 2014-08-28 ENCOUNTER — Telehealth

## 2014-08-28 MED ORDER — GABAPENTIN 800 MG PO TABS
800 MG | ORAL_TABLET | ORAL | Status: DC
Start: 2014-08-28 — End: 2014-10-17

## 2014-08-28 NOTE — Telephone Encounter (Signed)
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 She wanted to know if you could change his dosage to QID versus TID. Thanks,

## 2014-08-28 NOTE — Telephone Encounter (Signed)
Max dose of gabapentin is 800 mg TID  Cannot change this     Needs appointment. Can also be referred to pain management for steroid injections

## 2014-08-29 ENCOUNTER — Ambulatory Visit
Admit: 2014-08-29 | Discharge: 2014-08-29 | Payer: PRIVATE HEALTH INSURANCE | Attending: Family Medicine | Primary: Family

## 2014-08-29 DIAGNOSIS — R1313 Dysphagia, pharyngeal phase: Secondary | ICD-10-CM

## 2014-08-29 MED ORDER — FEXOFENADINE HCL 180 MG PO TABS
180 MG | ORAL_TABLET | Freq: Every day | ORAL | Status: DC
Start: 2014-08-29 — End: 2017-02-21

## 2014-08-29 MED ORDER — IBUPROFEN 800 MG PO TABS
800 MG | ORAL_TABLET | Freq: Three times a day (TID) | ORAL | Status: DC | PRN
Start: 2014-08-29 — End: 2016-03-23

## 2014-08-29 MED ORDER — FLUTICASONE PROPIONATE 50 MCG/ACT NA SUSP
50 MCG/ACT | Freq: Every day | NASAL | Status: DC
Start: 2014-08-29 — End: 2017-02-21

## 2014-08-29 MED ORDER — CYCLOBENZAPRINE HCL 10 MG PO TABS
10 MG | ORAL_TABLET | Freq: Three times a day (TID) | ORAL | Status: DC | PRN
Start: 2014-08-29 — End: 2016-03-23

## 2014-08-29 MED ORDER — HYDROXYZINE HCL 25 MG PO TABS
25 MG | ORAL_TABLET | Freq: Two times a day (BID) | ORAL | Status: DC | PRN
Start: 2014-08-29 — End: 2016-03-23
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
## 2014-08-29 NOTE — Progress Notes (Signed)
Have you seen any other physician or provider since your last visit no    Have you had any other diagnostic tests since your last visit? no    Have you changed or stopped any medications since your last visit including any over-the-counter medicines, vitamins, or herbal medicines? no     Are you taking all your prescribed medications? Yes  If NO, why? -  N/A           Patient Self-Management Goal for this visit.   What is your goal for your visit today?  Follow up   Barriers to success: none   Plan for overcoming my barriers: N/A      Confidence: 10/10   Date goal set: 08/29/14   Date expected to reach goal: 1day    Medical history Review  Past Medical, Family, and Social History reviewed and does contribute to the patient presenting condition    There are no preventive care reminders to display for this patient.

## 2014-08-29 NOTE — Patient Instructions (Signed)
Back Pain: Care Instructions  Your Care Instructions     Back pain has many possible causes. It is often related to problems with muscles and ligaments of the back. It may also be related to problems with the nerves, discs, or bones of the back. Moving, lifting, standing, sitting, or sleeping in an awkward way can strain the back. Sometimes you don't notice the injury until later. Arthritis is another common cause of back pain.  Although it may hurt a lot, back pain usually improves on its own within several weeks. Most people recover in 12 weeks or less. Using good home treatment and being careful not to stress your back can help you feel better sooner.  Follow-up care is a key part of your treatment and safety. Be sure to make and go to all appointments, and call your doctor if you are having problems. It???s also a good idea to know your test results and keep a list of the medicines you take.  How can you care for yourself at home?  ?? Sit or lie in positions that are most comfortable and reduce your pain. Try one of these positions when you lie down:  ?? Lie on your back with your knees bent and supported by large pillows.  ?? Lie on the floor with your legs on the seat of a sofa or chair.  ?? Lie on your side with your knees and hips bent and a pillow between your legs.  ?? Lie on your stomach if it does not make pain worse.  ?? Do not sit up in bed, and avoid soft couches and twisted positions. Bed rest can help relieve pain at first, but it delays healing. Avoid bed rest after the first day of back pain.  ?? Change positions every 30 minutes. If you must sit for long periods of time, take breaks from sitting. Get up and walk around, or lie in a comfortable position.  ?? Try using a heating pad on a low or medium setting for 15 to 20 minutes every 2 or 3 hours. Try a warm shower in place of one session with the heating pad.  ?? You can also try an ice pack for 10 to 15 minutes every 2 to 3 hours. Put a thin cloth  between the ice pack and your skin.  ?? Take pain medicines exactly as directed.  ?? If the doctor gave you a prescription medicine for pain, take it as prescribed.  ?? If you are not taking a prescription pain medicine, ask your doctor if you can take an over-the-counter medicine.  ?? Take short walks several times a day. You can start with 5 to 10 minutes, 3 or 4 times a day, and work up to longer walks. Walk on level surfaces and avoid hills and stairs until your back is better.  ?? Return to work and other activities as soon as you can. Continued rest without activity is usually not good for your back.  ?? To prevent future back pain, do exercises to stretch and strengthen your back and stomach. Learn how to use good posture, safe lifting techniques, and proper body mechanics.  When should you call for help?  Call your doctor now or seek immediate medical care if:  ?? You have new or worsening numbness in your legs.  ?? You have new or worsening weakness in your legs. (This could make it hard to stand up.)  ?? You lose control of your bladder or bowels.    Watch closely for changes in your health, and be sure to contact your doctor if:  ?? Your pain gets worse.  ?? You are not getting better after 2 weeks.   Where can you learn more?   Go to [email or web address] and sign in to your MyChart account. Enter [phone] in the Search Health Information box to learn more about ???Back Pain: Care Instructions.???    If you do not have an account, please click on the ???Sign Up Now??? link.     ?? 2006-2015 [employer]wise, Inc[employer]. Care instructions adapted under license by [facility]. This care instruction is for use with your licensed healthcare professional. If you have questions about a medical condition or this instruction, always ask your healthcare professional. Healthwise, Incorporated disclaims any warranty or liability for your use of this information.  Content Version: 10.6.465758; Current as of: Dec 07, 2013              Anxiety Disorder: Care Instructions  Your Care Instructions  Anxiety is a normal reaction to stress. Difficult situations can cause you to have symptoms such as sweaty palms and a nervous feeling.  In an anxiety disorder, the symptoms are far more severe. Constant worry, muscle tension, trouble sleeping, nausea and diarrhea, and other symptoms can make normal daily activities difficult or impossible. These symptoms may occur for no reason, and they can affect your work, school, or social life. Medicines, counseling, and self-care can all help.  Follow-up care is a key part of your treatment and safety. Be sure to make and go to all appointments, and call your doctor if you are having problems. It's also a good idea to know your test results and keep a list of the medicines you take.  How can you care for yourself at home?  ?? Take medicines exactly as directed. Call your doctor if you think you are having a problem with your medicine.  ?? Go to your counseling sessions and follow-up appointments.  ?? Recognize and accept your anxiety. Then, when you are in a situation that makes you anxious, say to yourself, "This is not an emergency. I feel uncomfortable, but I am not in danger. I can keep going even if I feel anxious."  ?? Be kind to your body:  ?? Relieve tension with exercise or a massage.  ?? Get enough rest.  ?? Avoid alcohol, caffeine, nicotine, and illegal drugs. They can increase your anxiety level and cause sleep problems.  ?? Learn and do relaxation techniques. See below for more about these techniques.  ?? Engage your mind. Get out and do something you enjoy. Go to a funny movie, or take a walk or hike. Plan your day. Having too much or too little to do can make you anxious.  ?? Keep a record of your symptoms. Discuss your fears with a good friend or family member, or join a support group for people with similar problems. Talking to others sometimes relieves stress.  ?? Get involved in social  groups, or volunteer to help others. Being alone sometimes makes things seem worse than they are.  ?? Get at least 30 minutes of exercise on most days of the week to relieve stress. Walking is a good choice. You also may want to do other activities, such as running, swimming, cycling, or playing tennis or team sports.  Relaxation techniques  Do relaxation exercises 10 to 20 minutes a day. You can play soothing, relaxing music while you do them, if you  wish.  ?? Tell others in your house that you are going to do your relaxation exercises. Ask them not to disturb you.  ?? Find a comfortable place, away from all distractions and noise.  ?? Lie down on your back, or sit with your back straight.  ?? Focus on your breathing. Make it slow and steady.  ?? Breathe in through your nose. Breathe out through either your nose or mouth.  ?? Breathe deeply, filling up the area between your navel and your rib cage. Breathe so that your belly goes up and down.  ?? Do not hold your breath.  ?? Breathe like this for 5 to 10 minutes. Notice the feeling of calmness throughout your whole body.  As you continue to breathe slowly and deeply, relax by doing the following for another 5 to 10 minutes:  ?? Tighten and relax each muscle group in your body. You can begin at your toes and work your way up to your head.  ?? Imagine your muscle groups relaxing and becoming heavy.  ?? Empty your mind of all thoughts.  ?? Let yourself relax more and more deeply.  ?? Become aware of the state of calmness that surrounds you.  ?? When your relaxation time is over, you can bring yourself back to alertness by moving your fingers and toes and then your hands and feet and then stretching and moving your entire body. Sometimes people fall asleep during relaxation, but they usually wake up shortly afterward.  ?? Always give yourself time to return to full alertness before you drive a car or do anything that might cause an accident if you are not fully alert. Never play a  relaxation tape while you drive a car.  When should you call for help?  Call 911 anytime you think you may need emergency care. For example, call if:  ?? You feel you cannot stop from hurting yourself or someone else.  Watch closely for changes in your health, and be sure to contact your doctor if:  ?? You have anxiety or fear that affects your life.  ?? You have symptoms of anxiety that are new or different from those you had before.   Where can you learn more?   Go to [email or web address] and sign in to your MyChart account. Enter P754 in the Search Health Information box to learn more about ???Anxiety Disorder: Care Instructions.???    If you do not have an account, please click on the ???Sign Up Now??? link.     ?? 2006-2015 Healthwise, Inc[employer]. Care instructions adapted under license by [facility]. This care instruction is for use with your licensed healthcare professional. If you have questions about a medical condition or this instruction, always ask your healthcare professional. Healthwise, Incorporated disclaims any warranty or liability for your use of this information.  Content Version: 10.6.465758; Current as of: June 01, 2013

## 2014-08-29 NOTE — Telephone Encounter (Signed)
Seen in office 2/11.

## 2014-09-19 ENCOUNTER — Telehealth

## 2014-09-19 MED ORDER — CEPHALEXIN 500 MG PO CAPS
500 MG | ORAL_CAPSULE | Freq: Four times a day (QID) | ORAL | Status: AC
Start: 2014-09-19 — End: 2014-09-29

## 2014-09-19 NOTE — Telephone Encounter (Signed)
Patients wife called stating that she was in the ER with strep. David [name] is now having the same symptoms as her and they think he has strep as well. They want to know if you can call in an antibiotic for strep, or do you want him to be seen? Thanks..Marland Kitchen

## 2014-09-19 NOTE — Telephone Encounter (Signed)
PLEASE LET PATIENT KNOW TO PICK UP.  Call back if symptoms persist or fail to improve.    Thank you!  Justine Dines

## 2014-09-20 NOTE — Telephone Encounter (Signed)
Left message.

## 2014-10-17 ENCOUNTER — Telehealth

## 2014-10-17 ENCOUNTER — Encounter: Attending: Family Medicine | Primary: Family

## 2014-10-17 MED ORDER — GABAPENTIN 600 MG PO TABS
600 MG | ORAL_TABLET | Freq: Three times a day (TID) | ORAL | Status: DC
Start: 2014-10-17 — End: 2015-02-03

## 2014-10-17 NOTE — Telephone Encounter (Signed)
I refilled the requested dosage
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
## 2014-10-18 ENCOUNTER — Encounter: Attending: Family Medicine | Primary: Family

## 2014-11-21 NOTE — Telephone Encounter (Signed)
Letter  done

## 2014-11-21 NOTE — Telephone Encounter (Signed)
Patient called he needs a letter for [employer]  Stating that he is on Gabapentin and the diagnosis  As to why she is on the medication.  Will pick up on Monday

## 2014-11-21 NOTE — Telephone Encounter (Signed)
Please make letter. Patient is on gabapentin for chronic back pain

## 2015-02-03 ENCOUNTER — Encounter

## 2015-02-03 MED ORDER — GABAPENTIN 600 MG PO TABS
600 MG | ORAL_TABLET | Freq: Three times a day (TID) | ORAL | 3 refills | Status: DC
Start: 2015-02-03 — End: 2015-02-06

## 2015-02-03 NOTE — Telephone Encounter (Signed)
THIS WAS ALREADY DONE!    gabapentin (NEURONTIN) 600 MG tablet [[id number]][[id number]]  Order Details   ?? Dose: 600 mg Route: Oral Frequency: 3 TIMES DAILY   Dispense Quantity:  90 tablet Refills:  3 Fills Remaining:  3    ??      Sig: Take 1 tablet by mouth 3 times daily   ??      Written Date:  02/03/15 Expiration Date:  02/03/16     Start Date:  02/03/15 End Date:  --     ??      Ordering Provider:  -- DEA #:  -- NPI:  --    Authorizing Provider:  Cherylann RatelFlorentina Romello Hoehn, MD DEA #:  [id number]:  [id number] NPI:  [id number]    Diagnosis Association: Chronic low back pain without sciatica, unspecified back pain laterality (M54.5 , G89.29)      ?? Original Order:  gabapentin (NEURONTIN) 600 MG tablet [[id number]][[id number]]   ??    ?? Pharmacy:  [id number] [place] [address] [address] - P [phone] F [phone] DEA #:  [id number]     Pharmacy Comments:  --   ??      Quantity Remaining:  270 tablet Quantity Filled:  0 tablet        ??   Warnings History    ?? No Interaction Warnings Shown    ??   Order Audit Trail    ?? Number of times this order has been changed since signing: 1   ?? Order Audit Trail   ??   Medication Detail    ??    Disp Refills Start End      ?? gabapentin (NEURONTIN) 600 MG tablet 90 tablet 3 02/03/2015     ?? Sig - Route: Take 1 tablet by mouth 3 times daily - Oral    ?? E-Prescribing Status: Receipt confirmed by pharmacy (02/03/2015 ??8:49 AM EDT)    ??

## 2015-02-03 NOTE — Telephone Encounter (Signed)
Please [facility] the ref[place] [facility] [address], patient's wife is having a difficulty getting a ride, so if it could be sent asap she would be greatly appreciative.

## 2015-02-06 ENCOUNTER — Encounter

## 2015-02-06 MED ORDER — GABAPENTIN 600 MG PO TABS
600 MG | ORAL_TABLET | Freq: Three times a day (TID) | ORAL | 3 refills | Status: DC
Start: 2015-02-06 — End: 2015-02-07

## 2015-02-07 ENCOUNTER — Encounter

## 2015-02-07 MED ORDER — GABAPENTIN 600 MG PO TABS
600 MG | ORAL_TABLET | Freq: Three times a day (TID) | ORAL | 3 refills | Status: DC
Start: 2015-02-07 — End: 2015-04-21

## 2015-04-13 ENCOUNTER — Inpatient Hospital Stay
Admit: 2015-04-13 | Discharge: 2015-04-13 | Disposition: A | Discharge status: Home / Self Care | Attending: Emergency Medicine

## 2015-04-13 ENCOUNTER — Emergency Department: Admit: 2015-04-13 | Discharge: 2015-06-06 | Payer: PRIVATE HEALTH INSURANCE | Primary: Family

## 2015-04-13 DIAGNOSIS — R079 Chest pain, unspecified: Secondary | ICD-10-CM

## 2015-04-13 LAB — CBC WITH AUTO DIFFERENTIAL
Absolute Eos #: 0.2 10*3/uL (ref 0.0–0.4)
Absolute Lymph #: 2.3 10*3/uL (ref 1.0–4.8)
Absolute Mono #: 0.8 10*3/uL (ref 0.1–1.2)
Basophils Absolute: 0 10*3/uL (ref 0.0–0.2)
Basophils: 0 % (ref 0–2)
Eosinophils %: 3 % (ref 1–4)
Hematocrit: 41.3 % (ref 41–53)
Hemoglobin: 14.1 g/dL (ref 13.5–17.5)
Lymphocytes: 33 % (ref 24–44)
MCH: 28.5 pg (ref 26–34)
MCHC: 34 g/dL (ref 31–37)
MCV: 83.9 fL (ref 80–100)
MPV: 7.9 fL (ref 6.0–12.0)
Monocytes: 12 % — ABNORMAL HIGH (ref 2–11)
Platelets: 235 10*3/uL (ref 140–450)
RBC: 4.93 m/uL (ref 4.5–5.9)
RDW: 13.5 % (ref 12.5–15.4)
Seg Neutrophils: 52 % (ref 36–66)
Segs Absolute: 3.6 10*3/uL (ref 1.8–7.7)
WBC: 7 10*3/uL (ref 3.5–11.0)

## 2015-04-13 LAB — BASIC METABOLIC PANEL
Anion Gap: 10 mmol/L (ref 9–17)
BUN: 8 mg/dL (ref 6–20)
CO2: 27 mmol/L (ref 20–31)
Calcium: 8.9 mg/dL (ref 8.6–10.4)
Chloride: 104 mmol/L (ref 98–107)
Creatinine: 0.68 mg/dL — ABNORMAL LOW (ref 0.70–1.20)
GFR African American: >60 mL/min (ref >60)
GFR Non-African American: >60 mL/min (ref >60)
Glucose: 106 mg/dL — ABNORMAL HIGH (ref 70–99)
Potassium: 4.5 mmol/L (ref 3.7–5.3)
Sodium: 141 mmol/L (ref 135–144)

## 2015-04-13 LAB — POCT TROPONIN
POC Troponin I: 0 ng/mL (ref 0.00–0.10)
POC Troponin I: 0 ng/mL (ref 0.00–0.10)

## 2015-04-13 MED ORDER — PROMETHAZINE HCL 25 MG/ML IJ SOLN
25 MG/ML | Freq: Once | INTRAMUSCULAR | Status: AC
Start: 2015-04-13 — End: 2015-04-13
  Administered 2015-04-13: 14:00:00 12.5 mg via INTRAVENOUS

## 2015-04-13 MED FILL — PROMETHAZINE HCL 25 MG/ML IJ SOLN: 25 MG/ML | INTRAMUSCULAR | Qty: 1

## 2015-04-13 NOTE — ED Notes (Signed)
Pt ambulatory tto bathroom with steady gait, no distress noted. Will continue to monitor.     David [name] R MurLetitia Neriphy, RN  04/13/15 [phone]

## 2015-04-13 NOTE — ED Provider Notes (Signed)
[facility] ED  Emergency Department Encounter  Emergency Medicine Resident     Pt Name: David [name]  MRN: [id number]  Birthdate Nov 23, 1985  Date of evaluation: 04/13/15  PCP:  Cherylann Ratel, MD    CHIEF COMPLAINT       Chief Complaint   Patient presents with   ??? Chest Pain       HISTORY OF PRESENT ILLNESS  (Location/Symptom, Timing/Onset, Context/Setting, Quality, Duration, Modifying Factors, Severity.)      David [name] is a 29 y.o. male who presents with substernal chest pain that gets started this morning.  Patient states he was arguing with his girlfriend when his chest pain started.  Patient states chest pain was associated with diaphoresis, nausea but no vomiting.  Patient denies any shortness of breath.  Patient denies any history of chest pain, cardiac disease.  No history of DVTs or PEs.  Patient denies any family history of cardiac disease.  No history of pneumonia, pneumothorax.  Patient states his legs have been swollen for the past couple of days.      PAST MEDICAL / SURGICAL / SOCIAL / FAMILY HISTORY      has a past medical history of Back pain and Headache.     has no past surgical history on file.    Social History     Social History   ??? Marital status: Single     Spouse name: N/A   ??? Number of children: N/A   ??? Years of education: N/A     Occupational History   ??? Not on file.     Social History Main Topics   ??? Smoking status: Former Smoker     Quit date: 09/16/2012   ??? Smokeless tobacco: Not on file   ??? Alcohol use 0.0 oz/week     0 Standard drinks or equivalent per week   ??? Drug use: Yes     Special: Other-see comments      Comment: xanax   ??? Sexual activity: Yes     Partners: Female     Other Topics Concern   ??? Not on file     Social History Narrative       Family History   Problem Relation Age of Onset   ??? Cancer Other 90     possible colon cancer       Allergies:  Codeine    Home Medications:  Prior to Admission medications    Medication Sig Start Date End Date Taking?  Authorizing Provider   gabapentin (NEURONTIN) 600 MG tablet Take 1 tablet by mouth 3 times daily 02/07/15  Yes Cherylann Ratel, MD   methadone 10 MG/5ML solution Take 105 mg by mouth daily    Yes Historical Provider, MD   cyclobenzaprine (FLEXERIL) 10 MG tablet Take 1 tablet by mouth 3 times daily as needed for Muscle spasms (only as needed) Causes sedation, do not drive while taking this medication 08/29/14   Cherylann Ratel, MD   ibuprofen (ADVIL;MOTRIN) 800 MG tablet Take 1 tablet by mouth every 8 hours as needed for Pain 08/29/14   Cherylann Ratel, MD   fexofenadine (ALLEGRA) 180 MG tablet Take 1 tablet by mouth daily 08/29/14   Cherylann Ratel, MD   fluticasone (FLONASE) 50 MCG/ACT nasal spray 2 sprays by Nasal route daily 08/29/14   Cherylann Ratel, MD   hydrOXYzine (ATARAX) 25 MG tablet Take 1 tablet by mouth every 12 hours as needed for Anxiety 08/29/14   Florentina Chirica,  MD       REVIEW OF SYSTEMS    (2-9 systems for level 4, 10 or more for level 5)      Review of Systems   Constitutional: Positive for diaphoresis. Negative for activity change, appetite change, chills and fever.   HENT: Negative for dental problem, ear pain, mouth sores, rhinorrhea and sore throat.    Eyes: Negative for visual disturbance.   Respiratory: Negative for cough and shortness of breath.    Cardiovascular: Positive for chest pain. Negative for leg swelling.   Gastrointestinal: Positive for nausea. Negative for abdominal pain, constipation, diarrhea and vomiting.   Genitourinary: Negative for dysuria and hematuria.   Musculoskeletal: Negative for arthralgias, back pain, gait problem and myalgias.   Skin: Negative for color change.   Neurological: Negative for dizziness, weakness, light-headedness, numbness and headaches.   Psychiatric/Behavioral: Negative for agitation, confusion and suicidal ideas.       PHYSICAL EXAM   (up to 7 for level 4, 8 or more for level 5)      INITIAL VITALS:   Visit Vitals   ??? BP (!) 152/95    ??? Pulse 87   ??? Resp 16   ??? Ht  (1.727 m)   ??? Wt 238 lb (108 kg)   ??? SpO2 100%   ??? BMI 36.19 kg/m2       Physical Exam   Constitutional: He is oriented to person, place, and time. He appears well-developed and well-nourished. No distress.   HENT:   Head: Normocephalic and atraumatic.   Eyes: Pupils are equal, round, and reactive to light.   Neck: Normal range of motion. Neck supple.   Cardiovascular: Normal rate, regular rhythm, normal heart sounds and intact distal pulses.    No murmur heard.  Pulmonary/Chest: Effort normal and breath sounds normal. No respiratory distress. He has no wheezes. He has no rales. He exhibits tenderness.   Abdominal: Soft. Bowel sounds are normal. He exhibits no distension. There is no tenderness. There is no rebound and no guarding.   Musculoskeletal: Normal range of motion. He exhibits no edema, tenderness or deformity.   Neurological: He is alert and oriented to person, place, and time.   Skin: Skin is warm and dry. No rash noted. He is not diaphoretic. No erythema.   Psychiatric: He has a normal mood and affect. His behavior is normal.   Nursing note and vitals reviewed.      DIFFERENTIAL  DIAGNOSIS     PLAN (LABS / IMAGING / EKG):  Orders Placed This Encounter   Procedures   ??? XR Chest Standard TWO VW   ??? Basic Metabolic Panel   ??? CBC Auto Differential   ??? Cardiac monitoring   ??? Continuous Pulse Oximetry   ??? POCT troponin   ??? POCT troponin   ??? EKG 12 Lead   ??? Insert peripheral IV       MEDICATIONS ORDERED:  Orders Placed This Encounter   Medications   ??? promethazine (PHENERGAN) injection 12.5 mg       DDX: ACS, pneumothorax, pneumonia, GERD, anxiety     DIAGNOSTIC RESULTS / EMERGENCY DEPARTMENT COURSE / MDM     LABS:  Results for orders placed or performed during the hospital encounter of 04/13/15   Basic Metabolic Panel   Result Value Ref Range    Glucose 106 (H) 70 - 99 mg/dL    BUN 8 6 - 20 mg/dL    CREATININE 9.60 (L) 0.70 - 1.20  mg/dL    Bun/Cre Ratio NOT REPORTED 9 - 20     Calcium 8.9 8.6 - 10.4 mg/dL    Sodium 960 454 - 098 mmol/L    Potassium 4.5 3.7 - 5.3 mmol/L    Chloride 104 98 - 107 mmol/L    CO2 27 20 - 31 mmol/L    Anion Gap 10 9 - 17 mmol/L    GFR Non-African American >60 >60 mL/min    GFR African American >60 >60 mL/min    GFR Comment          GFR Staging NOT REPORTED    CBC Auto Differential   Result Value Ref Range    WBC 7.0 3.5 - 11.0 k/uL    RBC 4.93 4.5 - 5.9 m/uL    Hemoglobin 14.1 13.5 - 17.5 g/dL    Hematocrit 11.9 41 - 53 %    MCV 83.9 80 - 100 fL    MCH 28.5 26 - 34 pg    MCHC 34.0 31 - 37 g/dL    RDW 14.7 82.9 - 56.2 %    Platelets 235 140 - 450 k/uL    MPV 7.9 6.0 - 12.0 fL    Differential Type NOT REPORTED     Seg Neutrophils 52 36 - 66 %    Lymphocytes 33 24 - 44 %    Monocytes 12 (H) 2 - 11 %    Eosinophils Relative Percent 3 1 - 4 %    Basophils 0 0 - 2 %    Segs Absolute 3.60 1.8 - 7.7 k/uL    Absolute Lymph # 2.30 1.0 - 4.8 k/uL    Absolute Mono # 0.80 0.1 - 1.2 k/uL    Absolute Eos # 0.20 0.0 - 0.4 k/uL    Basophils Absolute 0.00 0.0 - 0.2 k/uL    WBC Morphology NOT REPORTED     RBC Morphology NOT REPORTED     Platelet Estimate NOT REPORTED    POCT troponin   Result Value Ref Range    POC Troponin I 0.00 0.00 - 0.10 ng/mL    POC Troponin Interp       The Troponin-I (POC) results cannot be compared to the Troponin-T results.   POCT troponin   Result Value Ref Range    POC Troponin I 0.00 0.00 - 0.10 ng/mL    POC Troponin Interp       The Troponin-I (POC) results cannot be compared to the Troponin-T results.       IMPRESSION: 29 year old male presenting with chest pain, nausea, diaphoresis which started this morning after he got upset with his girlfriend.  No history of cardiac disease, no family history of cardiac disease.  Patient denies any history of pneumothorax, PE, pneumonia.  Her eye symptoms.  Patient does have some bilateral lower extremity swelling.  Vitals reviewed and significant for slightly elevated blood pressure however not tachycardic,  afebrile and not in any respiratory distress.  Patient appears well, nontoxic.  Chest pain somewhat reproducible.  Plan is to do a cardiac workup to rule out ACS, pneumothorax, pneumonia.  Likely chest pain due to anxiety.  Heart Score low and the patient likely may be discharged after workup negative.    HEART Risk Score for Chest Pain Patients                       Patient Score  History   Highly suspicious???????????????????????????2  Moderately suspicious??????????????????...1     = 1    Slightly or non suspicious???????????????.0      ECG   Significant STD???????????????????????????....2        Nonspecific repolarization???????????????1      = 0    Normal (no change from previous)???..0      Age   >64????????????????????????????????????????????????2      > 45 - <65???????????????????????????????????????.1     = 0   < 46?????????????????????????????????????????????...0      Risk Factors  >2 risk factors?????????????????????????????????.2     I ??? 2 risk factors??????????????????????????????..1     = 0  No risk factors??????????????????????????????....0     Troponin   >3times normal limit????????????????????????...2      >1 time - <3 times normal limit????????????.1   = 0    Normal trop????????????????????????????????????..0     -----------------------------------------------------------------------------------------      TOTAL RISK SCORE =  1          RISK % =  2.5        Risk Factors: DM, smoker (current or recent < mo), HTN, HLP, FHx CAD, obesity,   dsv add-ons   SLE, CKDz, HIV, cocaine abuse    Score 0 ??? 3 =  2.5% MACE over next 6 wks = Discharge home  Score 4 ??? 6 =  20.3% MACE over next 6 wks = Obs admit  Score 7 - 10 = 72.7% MACE over next 6 wks = Early invasive Rx        RADIOLOGY:  Xr Chest Standard Two Vw    Result Date: 04/13/2015  EXAM: XR CHEST STANDARD TWO VW  - 04/13/2015 9:00 AM. HISTORY: chest pain, . COMPARISON: None. FINDINGS:  Frontal and lateral views of the chest demonstrate a normal cardiac silhouette. Bilateral costophrenic angles are clear. No effusions or focal airspace consolidation noted. Trachea is midline. No convincing evidence for pneumothorax. Visualized osseous structures and soft tissues are grossly intact.     No acute cardiopulmonary  pathology. Final report electronically signed by Wynonia Hazard, M.D. on 04/13/2015 9:11 AM      EKG  EKG Interpretation    Interpreted by me    Rhythm: normal sinus   Rate: normal  Axis: normal  Ectopy: none  Conduction: normal  ST Segments: no acute change  T Waves: no acute change  Q Waves: none    Clinical Impression: no acute changes and normal EKG    EMERGENCY DEPARTMENT COURSE:  PAtient reassessed and updated on negative results.  We'll proceed with discharge.    PROCEDURES:  None    CONSULTS:  None    CRITICAL CARE:  None    FINAL IMPRESSION      1. Chest pain, unspecified type          DISPOSITION / PLAN     DISPOSITION decision to discharge     PATIENT REFERRED TO:  Cherylann Ratel, MD  [address], [address]  [place] [address]  [phone]    Schedule an appointment as soon as possible for a visit  for follow up     [facility] ED  [address]  [place] [address]  [phone]  Go to  If symptoms worsen      DISCHARGE MEDICATIONS:  Discharge Medication List as of 04/13/2015 11:21 AM          Clint Lipps, MD  Emergency Medicine Resident    (Please note that portions of this note were completed with a voice recognition program.  Efforts were made to edit the dictations but occasionally words are mis-transcribed.)       Clint Lipps, MD  Resident  04/13/15 [phone]

## 2015-04-13 NOTE — ED Triage Notes (Signed)
Patient to ED with complaints of midsternal chest pain that started this morning while having argument with girlfriend. Patient states pain did radiate to left shoulder. Patient denies cardiac hx or blood clot. Patient denies hx of chest pain. Patient denies SOB but states he felt nauseous and felt sweaty. Patient states his ankles have been swelling three days. Patient denies taking anything for pain.

## 2015-04-13 NOTE — Progress Notes (Signed)
Addressed in ED  Normal EKG

## 2015-04-13 NOTE — Other (Addendum)
Patient Acct Nbr:  [id number]  Primary AUTH/CERT:    Primary Insurance Company Name:   MEDICA[employer]  Primary Insurance Plan Name:    Primary Insurance Group Number:    Primary Insurance Plan Type: W  Primary Insurance Policy Number:  [id number]

## 2015-04-13 NOTE — Progress Notes (Signed)
Addressed in ED  no acute disease

## 2015-04-13 NOTE — ED Provider Notes (Signed)
[facility]     Emergency Department     Faculty Attestation    I performed a history and physical examination of the patient and discussed management with the resident. I reviewed the resident???s note and agree with the documented findings and plan of care. Any areas of disagreement are noted on the chart. I was personally present for the key portions of any procedures. I have documented in the chart those procedures where I was not present during the key portions. I have reviewed the emergency nurses triage note. I agree with the chief complaint, past medical history, past surgical history, allergies, medications, social and family history as documented unless otherwise noted below. For Physician Assistant/ Nurse Practitioner cases/documentation I have personally evaluated this patient and have completed at least one if not all key elements of the E/M (history, physical exam, and MDM). Additional findings are as noted.     Chest clear,  Heart exam normal , no pain or swelling on examination of the lower extremities , equal pulses both wrists , trachea midline.  Abdomen is nontender without pulsatile mass or bruit.  Chest pain does not radiate to the back , and the pain is not pleuritic. Patient appears comfortable in no distress, skin is warm and dry.    EKG Interpretation    Interpreted by me    Rhythm: normal sinus   Rate: normal  Axis: normal  Ectopy: none  Conduction: normal  ST Segments: no acute change  T Waves: no acute change  Q Waves: none    Clinical Impression: no acute changes and normal EKG    Sharen Counter MD [facility]  Attending Physician                Hurshel Party, MD  04/13/15 [phone]

## 2015-04-13 NOTE — ED Notes (Signed)
Patient to Deedra Ehrich, RN  04/13/15 0900

## 2015-04-13 NOTE — ED Notes (Signed)
Resident at bedside.      Claudie Leach, RN  04/13/15 [phone]

## 2015-04-13 NOTE — Progress Notes (Signed)
Addressed in ED

## 2015-04-13 NOTE — Progress Notes (Signed)
Addressed in ED  Mild hyperglycemia , non-fasting  No results found for: LABA1C  No results found for: EAG

## 2015-04-21 ENCOUNTER — Telehealth

## 2015-04-21 MED ORDER — GABAPENTIN 800 MG PO TABS
800 MG | ORAL_TABLET | Freq: Every day | ORAL | 0 refills | Status: DC
Start: 2015-04-21 — End: 2015-04-22

## 2015-04-21 NOTE — Telephone Encounter (Signed)
Noted. Thank you!

## 2015-04-21 NOTE — Telephone Encounter (Signed)
Please let the patient know to pick prescription from pharmacy.  Requested Prescriptions     Signed Prescriptions Disp Refills   ??? gabapentin (NEURONTIN) 800 MG tablet 90 tablet 0     Sig: Take 1 tablet by mouth daily     Authorizing Provider: Cherylann Ratel         Thank you!

## 2015-04-21 NOTE — Telephone Encounter (Signed)
Patient is asking to increase his gabapentin to 800 mg since he has began working again. Please advise.        Health Maintenance   Topic Date Due   ??? DTaP/Tdap/Td vaccine (1 - Tdap) 09/23/2003   ??? Flu Vaccine (1) 02/17/2015   ??? HIV SCREENING  Completed       No results found for: LABA1C          ( goal A1C is < 7)   No results found for: LABMICR  LDL CHOLESTEROL (mg/dL)   Date Value   16/04/9603 94       (goal LDL is <100)   AST (U/L)   Date Value   07/24/2013 27     ALT (U/L)   Date Value   07/24/2013 34     BUN (mg/dL)   Date Value   54/03/8118 8     BP Readings from Last 3 Encounters:   04/13/15 159/80   08/29/14 124/84   12/28/13 122/90          (goal 120/80)    All Future Testing planned in [facility]  Lab Frequency Next Occurrence   XR Lumbar AP LAT L5S1 [place] Once 09/27/2014   XR SacroilIAC Joints Standard Once 09/27/2014       Next Visit Date:  No future appointments.         Patient Active Problem List:     Fatigue     Difficulty swallowing     Constipation     Sinusitis, chronic     Migraine     Chronic low back pain     Postnasal drip     Anxiety     Neck pain     Allergic rhinitis     Unintended weight gain     Obesity (BMI 30-39.9)

## 2015-04-21 NOTE — Telephone Encounter (Signed)
Pt was notified to pick up rx.

## 2015-04-22 ENCOUNTER — Telehealth

## 2015-04-22 MED ORDER — GABAPENTIN 800 MG PO TABS
800 MG | ORAL_TABLET | Freq: Three times a day (TID) | ORAL | 0 refills | Status: DC
Start: 2015-04-22 — End: 2015-05-27

## 2015-04-22 NOTE — Telephone Encounter (Signed)
You refilled patient Neuronten 800 mg for daily and he said it is suppose to be 3 times a day

## 2015-04-22 NOTE — Telephone Encounter (Signed)
His wife wants to thank you and they appreciate everything you do for them

## 2015-04-22 NOTE — Telephone Encounter (Signed)
Pt was notified to pick up rx.

## 2015-04-22 NOTE — Telephone Encounter (Signed)
Noted. Thank you!

## 2015-04-22 NOTE — Telephone Encounter (Signed)
Patient is right. I resent it  1. Chronic bilateral low back pain without sciatica  - gabapentin (NEURONTIN) 800 MG tablet; Take 1 tablet by mouth 3 times daily Please cancel "daily" RX  Dispense: 90 tablet; Refill: 0

## 2015-05-27 ENCOUNTER — Encounter

## 2015-05-27 MED ORDER — GABAPENTIN 800 MG PO TABS
800 MG | ORAL_TABLET | Freq: Three times a day (TID) | ORAL | 0 refills | Status: DC
Start: 2015-05-27 — End: 2015-07-02

## 2015-06-02 ENCOUNTER — Encounter

## 2015-06-10 ENCOUNTER — Telehealth

## 2015-06-10 MED ORDER — AMOXICILLIN 500 MG PO TABS
500 MG | ORAL_TABLET | Freq: Three times a day (TID) | ORAL | 0 refills | Status: AC
Start: 2015-06-10 — End: 2015-06-20

## 2015-06-10 NOTE — Telephone Encounter (Signed)
Please let the patient know to pick prescription from pharmacy.  Requested Prescriptions     Signed Prescriptions Disp Refills   ??? Amoxicillin 500 MG TABS 30 tablet 0     Sig: Take 500 mg by mouth every 8 hours for 10 days     Authorizing Provider: Cherylann RatelHIRICA, Ivanna Kocak         Thank you!

## 2015-06-10 NOTE — Telephone Encounter (Signed)
Pt would like amoxicillin called in for tooth infection. Pt states cannot get into dentist until 06/18/15. Pt is in pain, please advise.

## 2015-06-16 ENCOUNTER — Encounter: Payer: Self-pay | Admitting: Sports Medicine

## 2015-06-16 ENCOUNTER — Ambulatory Visit (INDEPENDENT_AMBULATORY_CARE_PROVIDER_SITE_OTHER): Payer: BLUE CROSS/BLUE SHIELD | Admitting: Sports Medicine

## 2015-06-16 VITALS — BP 143/77 | HR 75 | Wt 180.0 lb

## 2015-06-16 DIAGNOSIS — M7120 Synovial cyst of popliteal space [Baker], unspecified knee: Secondary | ICD-10-CM | POA: Insufficient documentation

## 2015-06-16 DIAGNOSIS — M7121 Synovial cyst of popliteal space [Baker], right knee: Secondary | ICD-10-CM

## 2015-06-16 NOTE — Progress Notes (Signed)
Subjective:    I'm seeing this patient as a consultation for: right leg pain   CC: "knot in the back of my right knee"  HPI: Patient presents with a 7 mo history of an intermittent right knee mass that is accompanied by "discomfort" that is worse with movement. The mass first came on when he was squating "more than I should have." Since that time, the mass comes and goes. Doing excessive physical exercise seems to bring the mass on and he has tried massage to make it go away, which has not worked. The mass has limited his ability to squat and do lower extremity strength training and he is concerned about this.  He denies any weakness, sensory changes, decreased ROM, redness, swelling, or radiation to the pain. He has had no recent long car/plane rides or immobilization of any kind. He denies SOB.  Past medical history, Surgical history, Family history not pertinant except as noted below, Social history, Allergies, and medications have been entered into the medical record, reviewed, and no changes needed.   Review of Systems: No headache, visual changes, nausea, vomiting, diarrhea, constipation, dizziness, abdominal pain, skin rash, fevers, chills, night sweats, weight loss, swollen lymph nodes, body aches, joint swelling, muscle aches, chest pain, shortness of breath, mood changes, visual or auditory hallucinations.   Objective:   General: Well Developed, well nourished, and in no acute distress.  Neuro/Psych: Alert and oriented x3, extra-ocular muscles intact, able to move all 4 extremities, sensation grossly intact. Skin: Warm and dry, no rashes noted.  Respiratory: Not using accessory muscles, speaking in full sentences, trachea midline.  Cardiovascular: Pulses palpable, no extremity edema. Abdomen: Does not appear distended. Right Knee: Normal to inspection with no erythema or effusion or obvious bony abnormalities. Palpation normal with no warmth, joint line tenderness, patellar  tenderness, or condyle tenderness. ROM full in flexion and extension and lower leg rotation. Ligaments with solid consistent endpoints including ACL, PCL, LCL, MCL. Negative Mcmurray's, Apley's, and Thessalonian tests. Non painful patellar compression. Patellar glide without crepitus. Patellar and quadriceps tendons unremarkable. Hamstring and quadriceps strength is normal.  Posterior palpation remarkable for small compressible mass along the midline  Procedure: Real-time Ultrasound Guided aspiration/Injection of complex right knee Baker's cyst Device: GE Logiq E  Verbal informed consent obtained.  Time-out conducted.  Noted no overlying erythema, induration, or other signs of local infection.  Skin prepped in a sterile fashion.  Local anesthesia: Topical Ethyl chloride.  With sterile technique and under real time ultrasound guidance:  Using 18-gauge needle I was able to advance into each of the multiloculated segments of the Baker's cyst, I aspirated a small amount of thick fluid, syringe was switched and a total of 1 mL kenalog 40, 1 mL lidocaine spread out between the loculations of the Baker's cyst. Completed without difficulty  Pain immediately resolved suggesting accurate placement of the medication.  Advised to call if fevers/chills, erythema, induration, drainage, or persistent bleeding.  Images permanently stored and available for review in the ultrasound unit.  Impression: Technically successful ultrasound guided injection.  Impression and Recommendations:   This case required medical decision making of moderate complexity.  Patient's presentation most likely a Baker's cyst given the history of onset with heavy exercise and location of mass. I do not think that this patient tore a hamstring due to lack of bruising and pain. A DVT is unlikely due to lack of leg swelling, redness, pain, and risk factors (smoking, age, immobilization, medications, etc).  Update:  U/S showed a  multiloculated complex cyst that was subsequently aspirated and injected with a steroid:lidocaine (1:1) mixture. He was instructed to avoid weight training for at least 1 week and return PRN.

## 2015-06-16 NOTE — Assessment & Plan Note (Signed)
Aspiration and injection of multiloculated right knee Baker's cyst.  Strapped with compressive dressing, return to see me in one month.

## 2015-07-01 ENCOUNTER — Telehealth

## 2015-07-01 NOTE — Telephone Encounter (Signed)
OARRS done

## 2015-07-01 NOTE — Telephone Encounter (Signed)
Phone # not reachable

## 2015-07-01 NOTE — Telephone Encounter (Signed)
Please notify pt that gabapentin is now controlled, starting this month and I cannot prescribe it anymore.  If needed, I could wean him off x 2 months, or refer him to pain management . He needs to inform his methadone clinic that he is taking gabapentin.    Alternatively, I could work on starting him on cymbalta, might need appointment        No future appointments.        Controlled Substances Monitoring: Attestation: The Prescription Monitoring Report for this patient was reviewed today. Cherylann Ratel(Saachi Zale, MD)  Documentation: No signs of potential drug abuse or diversion identified. Cherylann Ratel(Donyel Castagnola, MD)

## 2015-07-02 ENCOUNTER — Encounter

## 2015-07-02 MED ORDER — GABAPENTIN 600 MG PO TABS
600 MG | ORAL_TABLET | Freq: Three times a day (TID) | ORAL | 0 refills | Status: DC
Start: 2015-07-02 — End: 2015-07-31

## 2015-07-02 NOTE — Telephone Encounter (Signed)
See prior refill denial

## 2015-07-02 NOTE — Telephone Encounter (Signed)
Patient wants to get wean off, he is not able to come in now, but made an appt for 07/23/14. Methadone clinic is aware of him taking gebapentin

## 2015-07-02 NOTE — Telephone Encounter (Signed)
.    Please let the patient know to pick prescription from  OFFICE  I MADE CONTRACT, PRINTED [id number]     RX printed in Med Room   Requested Prescriptions     Signed Prescriptions Disp Refills   ??? gabapentin (NEURONTIN) 600 MG tablet 90 tablet 0     Sig: Take 1 tablet by mouth 3 times daily     Authorizing Provider: Cherylann RatelHIRICA, Charlotte Brafford     Refused Prescriptions Disp Refills   ??? gabapentin (NEURONTIN) 800 MG tablet 90 tablet 0     Sig: Take 1 tablet by mouth 3 times daily     Refused By: Cherylann RatelHIRICA, Lilley Hubble     Reason for Refusal: Patient should contact provider first     1. Chronic bilateral low back pain without sciatica  gabapentin (NEURONTIN) 800 MG tablet    gabapentin (NEURONTIN) 600 MG tablet         Controlled Substances Monitoring: Attestation: The Prescription Monitoring Report for this patient was reviewed today. Cherylann Ratel(Carlie Corpus, MD)  Documentation: Possible medication side effects, risk of tolerance and/or dependence, and alternative treatments discussed, No signs of potential drug abuse or diversion identified., Medication contract signed today. (OARRS done 07/01/15, pt will come in and sign the contract) Cherylann Ratel(Kasee Hantz, MD)      Thank you!

## 2015-07-04 NOTE — Telephone Encounter (Signed)
script picked up

## 2015-07-07 ENCOUNTER — Encounter: Attending: Family Medicine | Primary: Family

## 2015-07-15 ENCOUNTER — Encounter: Payer: Self-pay | Admitting: Sports Medicine

## 2015-07-15 ENCOUNTER — Ambulatory Visit (INDEPENDENT_AMBULATORY_CARE_PROVIDER_SITE_OTHER): Payer: BLUE CROSS/BLUE SHIELD | Admitting: Sports Medicine

## 2015-07-15 VITALS — BP 140/82 | HR 76 | Temp 98.0°F | Resp 18 | Wt 177.6 lb

## 2015-07-15 DIAGNOSIS — M7121 Synovial cyst of popliteal space [Baker], right knee: Secondary | ICD-10-CM

## 2015-07-15 NOTE — Progress Notes (Signed)
  Subjective:    CC: Recheck knee  HPI: This is a pleasant 29 year old male, a month ago I drained and injected a complex right knee Baker's cyst, he had a fantastic response, and had no pain or recurrence of swelling, he suddenly he was doing some squats, and has a small recurrence of the Baker's cyst but this time without pain, without any effect on his range of motion. Overall he is significantly better than before the previous procedure. Continues to be happy with his results.  Past medical history, Surgical history, Family history not pertinant except as noted below, Social history, Allergies, and medications have been entered into the medical record, reviewed, and no changes needed.   Review of Systems: No fevers, chills, night sweats, weight loss, chest pain, or shortness of breath.   Objective:    General: Well Developed, well nourished, and in no acute distress.  Neuro: Alert and oriented x3, extra-ocular muscles intact, sensation grossly intact.  HEENT: Normocephalic, atraumatic, pupils equal round reactive to light, neck supple, no masses, no lymphadenopathy, thyroid nonpalpable.  Skin: Warm and dry, no rashes. Cardiac: Regular rate and rhythm, no murmurs rubs or gallops, no lower extremity edema.  Respiratory: Clear to auscultation bilaterally. Not using accessory muscles, speaking in full sentences. Right Knee: Normal to inspection with no erythema or effusion or obvious bony abnormalities. Palpation normal with no warmth or joint line tenderness or patellar tenderness or condyle tenderness, only minimal fullness at the semimembranosus/gastrocnemius crux. ROM normal in flexion and extension and lower leg rotation. Ligaments with solid consistent endpoints including ACL, PCL, LCL, MCL. Negative Mcmurray's and provocative meniscal tests. Non painful patellar compression. Patellar and quadriceps tendons unremarkable. Hamstring and quadriceps strength is normal.  Impression and  Recommendations:

## 2015-07-15 NOTE — Assessment & Plan Note (Signed)
Improved significantly after aspiration and injection of complex Baker's cyst at the last visit, there has been a slight recurrence but it is nonpainful and doesn't interfere with his range of motion as it did before. We discussed improvements in technique with squatting, and not to go all the way to the floor. Return as needed.

## 2015-07-24 ENCOUNTER — Encounter: Attending: Family Medicine | Primary: Family

## 2015-07-29 ENCOUNTER — Telehealth

## 2015-07-29 NOTE — Telephone Encounter (Signed)
Left message asking patient to call the office.

## 2015-07-29 NOTE — Telephone Encounter (Signed)
Please check with pt if OK to Decreased dose of Gabapentin to 400 mg TID?

## 2015-07-29 NOTE — Telephone Encounter (Signed)
Health Maintenance   Topic Date Due   ??? DTaP/Tdap/Td vaccine (1 - Tdap) 09/23/2003   ??? Flu vaccine (1) 02/17/2015   ??? HIV screen  Completed       No results found for: LABA1C          ( goal A1C is < 7)   No results found for: LABMICR  LDL Cholesterol (mg/dL)   Date Value   16/10/960408/11/2011 94       (goal LDL is <100)   AST (U/L)   Date Value   07/24/2013 27     ALT (U/L)   Date Value   07/24/2013 34     BUN (mg/dL)   Date Value   54/09/811909/25/2016 8     BP Readings from Last 3 Encounters:   04/13/15 159/80   08/29/14 124/84   12/28/13 122/90          (goal 120/80)    All Future Testing planned in [facility]  Lab Frequency Next Occurrence   XR Lumbar AP LAT L5S1 [place] Once 09/27/2014   XR SacroilIAC Joints Standard Once 09/27/2014       Next Visit Date:  Future Appointments  Date Time Provider Department Center   08/05/2015 9:30 AM Cherylann RatelFlorentina Chirica, MD [facility]  [facility]            Patient Active Problem List:     Fatigue     Difficulty swallowing     Constipation     Sinusitis, chronic     Migraine     Chronic low back pain     Postnasal drip     Anxiety     Neck pain     Allergic rhinitis     Unintended weight gain     Obesity (BMI 30-39.9)

## 2015-07-30 NOTE — Telephone Encounter (Signed)
SOUNDS GOOD!  THEN I WILL NOT REFILL IT AT ALL. GABAPENTIN IS BECOMING CONTROLLED SUBSTANCE. OK FOR THEM TO FIND ANOTHER DOCTOR          fyI      David [name]      ?? 2:08 PM   Note      OARRS done         ??      ?? 2:09 PM   David [name] routed this conversation to Me    Me      ?? 3:04 PM   Note      Please notify pt that gabapentin is now controlled, starting this month and I cannot prescribe it anymore.  If needed, I could wean him off x 2 months, or refer him to pain management . He needs to inform his methadone clinic that he is taking gabapentin.  ??  Alternatively, I could work on starting him on cymbalta, might need appointment  ??  ??  ??  No future appointments.  ??  ??  ??  Controlled Substances Monitoring: Attestation: The Prescription Monitoring Report for this patient was reviewed today. Cherylann Ratel, MD)  Documentation: No signs of potential drug abuse or diversion identified. Cherylann Ratel, MD)  ??  ??         ??      ?? 3:07 PM   You routed this conversation to [facility] Front Desk Pool     David [name]      ?? 4:52 PM   Note      Phone # not reachable          July 02, 2015   David [name]      ?? 9:21 AM   Note      Patient wants to get wean off, he is not able to come in now, but made an appt for 07/23/14. Methadone clinic is aware of him taking gebapentin         ??      ?? 10:14 AM   David [name] routed this conversation to Me     Me      ?? 11:18 AM   Note      ??  ??  .  Please let the patient know to pick prescription from OFFICE  I MADE CONTRACT, PRINTED [id number]   ??  RX printed in Med Room   Requested Prescriptions   ??         Signed Prescriptions Disp Refills   ??? gabapentin (NEURONTIN) 600 MG tablet 90 tablet 0   ?? ?? Sig: Take 1 tablet by mouth 3 times daily   ?? ?? Authorizing Provider: Cedarius Kersh   ??         Refused Prescriptions Disp Refills   ??? gabapentin (NEURONTIN) 800 MG tablet 90 tablet 0   ?? ?? Sig: Take 1 tablet by mouth 3 times daily   ?? ?? Refused By:  Cherylann Ratel   ?? ?? Reason for Refusal: Patient should contact provider first   ??  1. Chronic bilateral low back pain without sciatica  gabapentin (NEURONTIN) 800 MG tablet   ?? gabapentin (NEURONTIN) 600 MG tablet   ??  ??  ??  Controlled Substances Monitoring: Attestation: The Prescription Monitoring Report for this patient was reviewed today. Cherylann Ratel, MD)  Documentation: Possible medication side effects, risk of tolerance and/or dependence, and alternative treatments discussed, No signs of potential drug abuse or diversion identified., Medication  contract signed today. (OARRS done 07/01/15, pt will come in and sign the contract) Cherylann Ratel(Ledora Delker, MD)  ??  ??  Thank you!  ??         ??      ?? 11:19 AM   You routed this conversation to [facility] Front Desk Pool     July 04, 2015   David [name]      ?? 11:30 AM   Note      script picked up          Additional Documentation

## 2015-07-30 NOTE — Telephone Encounter (Signed)
error 

## 2015-07-30 NOTE — Telephone Encounter (Signed)
His wife called back. She said he didn't agree to get weaned off of this medicine.She thinks it was misunderstanding. If that is the case to get weaned off, he will find a new doctor per wife. I spoke with a patient for a moment but he gave permission to his wife to talk for him.She said this was BS.

## 2015-07-31 ENCOUNTER — Encounter

## 2015-07-31 MED ORDER — GABAPENTIN 400 MG PO CAPS
400 MG | ORAL_CAPSULE | Freq: Three times a day (TID) | ORAL | 0 refills | Status: DC
Start: 2015-07-31 — End: 2015-12-15

## 2015-07-31 NOTE — Telephone Encounter (Signed)
Pt made aware.

## 2015-07-31 NOTE — Telephone Encounter (Signed)
Wife called today wants to apologies

## 2015-07-31 NOTE — Telephone Encounter (Signed)
Please let the patient know to pick prescription from pharmacy.  Requested Prescriptions     Signed Prescriptions Disp Refills   ??? gabapentin (NEURONTIN) 400 MG capsule 90 capsule 0     Sig: Take 1 capsule by mouth 3 times daily     Authorizing Provider: Cherylann RatelHIRICA, Undine Nealis     Refused Prescriptions Disp Refills   ??? gabapentin (NEURONTIN) 600 MG tablet 90 tablet 0     Sig: Take 1 tablet by mouth 3 times daily     Refused By: Cherylann RatelHIRICA, Mariesha Venturella     Reason for Refusal: Patient should contact provider first         Thank you!

## 2015-07-31 NOTE — Telephone Encounter (Signed)
Wife called to apologies. She said her husband would like to keep you as a doctor and would like to be weaned off of Gabapentin. He called to make an appt with Pain Clinic and was told he will be called back.

## 2015-07-31 NOTE — Addendum Note (Signed)
Addended by: Cherylann RatelHIRICA, Christine Schiefelbein on: 07/31/2015 02:26 PM     Modules accepted: Orders

## 2015-08-01 NOTE — Telephone Encounter (Signed)
noted 

## 2015-08-01 NOTE — Telephone Encounter (Signed)
Received a phone call from patients family member about needing a letter from his doctor as to why he is on Gabapentin.  [facility]  I informed her that we need to receive [facility] before we can sent the letter.  She stated that they will be faxing the request.  Have not received any fax as of yet.

## 2015-08-05 ENCOUNTER — Encounter: Attending: Family Medicine | Primary: Family

## 2015-08-06 LAB — EKG 12-LEAD
Atrial Rate: 86 {beats}/min
P Axis: 63 degrees
P-R Interval: 158 ms
Q-T Interval: 370 ms
QRS Duration: 94 ms
QTc Calculation (Bazett): 442 ms
R Axis: 36 degrees
T Axis: 41 degrees
Ventricular Rate: 86 {beats}/min

## 2015-08-06 NOTE — Telephone Encounter (Signed)
Faxed.

## 2015-08-06 NOTE — Telephone Encounter (Signed)
Received the request for [facility]  Letter made

## 2015-08-13 ENCOUNTER — Encounter: Attending: Family | Primary: Family

## 2015-08-28 ENCOUNTER — Encounter

## 2015-08-28 NOTE — Telephone Encounter (Signed)
Needs appointment

## 2015-09-01 ENCOUNTER — Encounter: Attending: Family Medicine | Primary: Family

## 2015-09-09 ENCOUNTER — Observation Stay
Admission: EM | Admit: 2015-09-09 | Discharge: 2015-09-11 | Disposition: A | Discharge status: Home / Self Care | Payer: PRIVATE HEALTH INSURANCE | Source: Other Acute Inpatient Hospital | Admitting: Internal Medicine

## 2015-09-09 ENCOUNTER — Emergency Department: Payer: PRIVATE HEALTH INSURANCE | Primary: Family

## 2015-09-09 ENCOUNTER — Emergency Department: Admit: 2015-09-09 | Discharge: 2015-09-19 | Payer: PRIVATE HEALTH INSURANCE | Primary: Family

## 2015-09-09 DIAGNOSIS — J189 Pneumonia, unspecified organism: Secondary | ICD-10-CM

## 2015-09-09 LAB — RAPID INFLUENZA A/B ANTIGENS: Direct Exam: NEGATIVE

## 2015-09-09 LAB — POC PANEL (G3)-VEN
HCO3, Venous: 28.1 mmol/L (ref 24–30)
O2 Sat, Ven: 18 %
Positive Base Excess, Ven: 2 (ref 0.0–2.0)
Total CO2, Venous: 30 mm Hg (ref 25–31)
pCO2, Ven: 54 mm Hg (ref 39–55)
pH, Ven: 7.32 (ref 7.32–7.42)
pO2, Ven: 16 mm Hg — ABNORMAL LOW (ref 30–50)

## 2015-09-09 LAB — BASIC METABOLIC PANEL
Anion Gap: 13 mmol/L (ref 9–17)
BUN: 12 mg/dL (ref 6–20)
CO2: 25 mmol/L (ref 20–31)
Calcium: 9.1 mg/dL (ref 8.6–10.4)
Chloride: 94 mmol/L — ABNORMAL LOW (ref 98–107)
Creatinine: 0.72 mg/dL (ref 0.70–1.20)
GFR African American: >60 mL/min (ref >60)
GFR Non-African American: >60 mL/min (ref >60)
Glucose: 129 mg/dL — ABNORMAL HIGH (ref 70–99)
Potassium: 4.5 mmol/L (ref 3.7–5.3)
Sodium: 132 mmol/L — ABNORMAL LOW (ref 135–144)

## 2015-09-09 LAB — HEPATIC FUNCTION PANEL
ALT: 49 U/L — ABNORMAL HIGH (ref 5–41)
AST: 39 U/L (ref <40)
Albumin/Globulin Ratio: 1.3 (ref 1.0–2.5)
Albumin: 4.4 g/dL (ref 3.5–5.2)
Alkaline Phosphatase: 96 U/L (ref 40–129)
Bilirubin, Direct: 0.1 mg/dL (ref <0.31)
Bilirubin, Indirect: 0.3 mg/dL (ref 0.00–1.00)
Total Bilirubin: 0.4 mg/dL (ref 0.3–1.2)
Total Protein: 7.8 g/dL (ref 6.4–8.3)

## 2015-09-09 LAB — CBC WITH AUTO DIFFERENTIAL
Absolute Eos #: 0 10*3/uL (ref 0.0–0.4)
Absolute Lymph #: 0.7 10*3/uL — ABNORMAL LOW (ref 1.0–4.8)
Absolute Mono #: 0.8 10*3/uL (ref 0.1–1.2)
Basophils Absolute: 0 10*3/uL (ref 0.0–0.2)
Basophils: 0 % (ref 0–2)
Eosinophils %: 0 % — ABNORMAL LOW (ref 1–4)
Hematocrit: 43.9 % (ref 41–53)
Hemoglobin: 15.1 g/dL (ref 13.5–17.5)
Lymphocytes: 4 % — ABNORMAL LOW (ref 24–44)
MCH: 28.6 pg (ref 26–34)
MCHC: 34.5 g/dL (ref 31–37)
MCV: 83.1 fL (ref 80–100)
MPV: 8.2 fL (ref 6.0–12.0)
Monocytes: 5 % (ref 2–11)
Platelets: 217 10*3/uL (ref 140–450)
RBC: 5.28 m/uL (ref 4.5–5.9)
RDW: 13.5 % (ref 12.5–15.4)
Seg Neutrophils: 91 % — ABNORMAL HIGH (ref 36–66)
Segs Absolute: 15.7 10*3/uL — ABNORMAL HIGH (ref 1.8–7.7)
WBC: 17.3 10*3/uL — ABNORMAL HIGH (ref 3.5–11.0)

## 2015-09-09 LAB — POC LACTIC ACID: POC Lactic Acid, VEN: 1.29 mmol/L (ref 0.90–1.70)

## 2015-09-09 LAB — LIPASE: Lipase: 24 U/L (ref 13–60)

## 2015-09-09 MED ORDER — NORMAL SALINE FLUSH 0.9 % IV SOLN
0.9 % | INTRAVENOUS | Status: DC | PRN
Start: 2015-09-09 — End: 2015-09-11

## 2015-09-09 MED ORDER — PROMETHAZINE HCL 25 MG/ML IJ SOLN
25 MG/ML | Freq: Once | INTRAMUSCULAR | Status: AC
Start: 2015-09-09 — End: 2015-09-09
  Administered 2015-09-09: 17:00:00 25 mg via INTRAVENOUS

## 2015-09-09 MED ORDER — LEVOFLOXACIN 750 MG PO TABS
750 MG | ORAL_TABLET | Freq: Every day | ORAL | 0 refills | Status: DC
Start: 2015-09-09 — End: 2015-09-09

## 2015-09-09 MED ORDER — PROMETHAZINE HCL 25 MG/ML IJ SOLN
25 MG/ML | Freq: Once | INTRAMUSCULAR | Status: AC
Start: 2015-09-09 — End: 2015-09-09
  Administered 2015-09-09: 12:00:00 25 mg via INTRAVENOUS

## 2015-09-09 MED ORDER — IOHEXOL 350 MG/ML IV SOLN
350 MG/ML | Freq: Once | INTRAVENOUS | Status: AC | PRN
Start: 2015-09-09 — End: 2015-09-09
  Administered 2015-09-09: 15:00:00 130 mL via INTRAVENOUS

## 2015-09-09 MED ORDER — SODIUM CHLORIDE 0.9 % IV BOLUS
0.9 % | Freq: Once | INTRAVENOUS | Status: AC
Start: 2015-09-09 — End: 2015-09-09
  Administered 2015-09-09: 12:00:00 1000 mL via INTRAVENOUS

## 2015-09-09 MED ORDER — CEFTRIAXONE SODIUM 1 G IJ SOLR
1 g | INTRAMUSCULAR | Status: AC
Start: 2015-09-09 — End: 2015-09-10
  Administered 2015-09-10: 17:00:00 1 g via INTRAVENOUS

## 2015-09-09 MED ORDER — LEVOFLOXACIN 750 MG PO TABS
750 MG | Freq: Once | ORAL | Status: DC
Start: 2015-09-09 — End: 2015-09-09

## 2015-09-09 MED ORDER — CETIRIZINE HCL 10 MG PO TABS
10 MG | Freq: Every day | ORAL | Status: DC
Start: 2015-09-09 — End: 2015-09-11
  Administered 2015-09-10 – 2015-09-11 (×2): 10 mg via ORAL

## 2015-09-09 MED ORDER — GABAPENTIN 400 MG PO CAPS
400 MG | Freq: Three times a day (TID) | ORAL | Status: DC
Start: 2015-09-09 — End: 2015-09-11
  Administered 2015-09-10 – 2015-09-11 (×5): 400 mg via ORAL

## 2015-09-09 MED ORDER — FLUTICASONE PROPIONATE 50 MCG/ACT NA SUSP
50 MCG/ACT | Freq: Every day | NASAL | Status: DC
Start: 2015-09-09 — End: 2015-09-11
  Administered 2015-09-10 – 2015-09-11 (×2): 2 via NASAL

## 2015-09-09 MED ORDER — NORMAL SALINE FLUSH 0.9 % IV SOLN
0.9 % | Freq: Two times a day (BID) | INTRAVENOUS | Status: DC
Start: 2015-09-09 — End: 2015-09-11
  Administered 2015-09-10 – 2015-09-11 (×3): 10 mL via INTRAVENOUS

## 2015-09-09 MED ORDER — DEXTROSE 5 % IV SOLN (MINI-BAG)
5 % | Freq: Once | INTRAVENOUS | Status: AC
Start: 2015-09-09 — End: 2015-09-09
  Administered 2015-09-09: 16:00:00 1 g via INTRAVENOUS

## 2015-09-09 MED ORDER — ENOXAPARIN SODIUM 40 MG/0.4ML SC SOLN
40 MG/0.4ML | Freq: Every day | SUBCUTANEOUS | Status: DC
Start: 2015-09-09 — End: 2015-09-11

## 2015-09-09 MED ORDER — AZITHROMYCIN 500 MG IV SOLR
500 MG | INTRAVENOUS | Status: DC
Start: 2015-09-09 — End: 2015-09-11
  Administered 2015-09-10 – 2015-09-11 (×2): 500 mg via INTRAVENOUS

## 2015-09-09 MED ORDER — ACETAMINOPHEN 325 MG PO TABS
325 MG | ORAL | Status: DC | PRN
Start: 2015-09-09 — End: 2015-09-11
  Administered 2015-09-09 – 2015-09-11 (×4): 650 mg via ORAL

## 2015-09-09 MED ORDER — SODIUM CHLORIDE 0.9 % IV BOLUS
0.9 % | Freq: Once | INTRAVENOUS | Status: AC
Start: 2015-09-09 — End: 2015-09-09
  Administered 2015-09-09: 16:00:00 3129 mL/kg via INTRAVENOUS

## 2015-09-09 MED ORDER — PROMETHAZINE HCL 25 MG/ML IJ SOLN
25 MG/ML | Freq: Four times a day (QID) | INTRAMUSCULAR | Status: DC | PRN
Start: 2015-09-09 — End: 2015-09-11
  Administered 2015-09-09: 21:00:00 12.5 mg via INTRAVENOUS

## 2015-09-09 MED ORDER — MAGNESIUM HYDROXIDE 400 MG/5ML PO SUSP
400 MG/5ML | Freq: Every day | ORAL | Status: DC | PRN
Start: 2015-09-09 — End: 2015-09-11

## 2015-09-09 MED ORDER — DEXTROSE 5 % IV SOLN
5 % | Freq: Once | INTRAVENOUS | Status: AC
Start: 2015-09-09 — End: 2015-09-09
  Administered 2015-09-09: 17:00:00 500 mg via INTRAVENOUS

## 2015-09-09 MED ORDER — ONDANSETRON HCL 4 MG/2ML IJ SOLN
4 MG/2ML | Freq: Four times a day (QID) | INTRAMUSCULAR | Status: DC | PRN
Start: 2015-09-09 — End: 2015-09-11

## 2015-09-09 MED ORDER — METHADONE HCL 5 MG/5ML PO SOLN
5 MG/ML | Freq: Every day | ORAL | Status: DC
Start: 2015-09-09 — End: 2015-09-11
  Administered 2015-09-09 – 2015-09-11 (×3): 110 mg via ORAL

## 2015-09-09 MED ORDER — CYCLOBENZAPRINE HCL 10 MG PO TABS
10 MG | Freq: Three times a day (TID) | ORAL | Status: DC | PRN
Start: 2015-09-09 — End: 2015-09-11

## 2015-09-09 MED ORDER — HYDROXYZINE HCL 25 MG PO TABS
25 MG | Freq: Two times a day (BID) | ORAL | Status: DC | PRN
Start: 2015-09-09 — End: 2015-09-11

## 2015-09-09 MED FILL — FLUTICASONE PROPIONATE 50 MCG/ACT NA SUSP: 50 MCG/ACT | NASAL | Qty: 16

## 2015-09-09 MED FILL — CEFTRIAXONE SODIUM 1 G IJ SOLR: 1 g | INTRAMUSCULAR | Qty: 1

## 2015-09-09 MED FILL — CYCLOBENZAPRINE HCL 10 MG PO TABS: 10 MG | ORAL | Qty: 1

## 2015-09-09 MED FILL — PROMETHAZINE HCL 25 MG/ML IJ SOLN: 25 MG/ML | INTRAMUSCULAR | Qty: 1

## 2015-09-09 MED FILL — HYDROXYZINE HCL 25 MG PO TABS: 25 MG | ORAL | Qty: 1

## 2015-09-09 MED FILL — METHADONE HCL 5 MG/5ML PO SOLN: 5 MG/ML | ORAL | Qty: 110

## 2015-09-09 MED FILL — GABAPENTIN 400 MG PO CAPS: 400 MG | ORAL | Qty: 1

## 2015-09-09 MED FILL — MAPAP 325 MG PO TABS: 325 MG | ORAL | Qty: 2

## 2015-09-09 MED FILL — DEXTROSE 5 % IV SOLN: 5 % | INTRAVENOUS | Qty: 50

## 2015-09-09 MED FILL — LEVOFLOXACIN 750 MG PO TABS: 750 MG | ORAL | Qty: 1

## 2015-09-09 MED FILL — ZITHROMAX 500 MG IV SOLR: 500 MG | INTRAVENOUS | Qty: 500

## 2015-09-09 MED FILL — CETIRIZINE HCL 10 MG PO TABS: 10 MG | ORAL | Qty: 1

## 2015-09-09 MED FILL — LOVENOX 40 MG/0.4ML SC SOLN: 40 MG/0.4ML | SUBCUTANEOUS | Qty: 0.4

## 2015-09-09 MED FILL — PROMETHAZINE HCL 25 MG/ML IJ SOLN: 25 mg/mL | INTRAMUSCULAR | Qty: 1

## 2015-09-09 NOTE — Progress Notes (Signed)
Addressed in ED. Currently admitted

## 2015-09-09 NOTE — Plan of Care (Signed)
Problem: Airway Clearance - Ineffective:  Goal: Clear lung sounds  Clear lung sounds   Lungs clear, nasal congestion present

## 2015-09-09 NOTE — Care Coordination-Inpatient (Signed)
Does not want to answer questions re: discharge planning /readmission risk at this time.
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
## 2015-09-09 NOTE — Progress Notes (Signed)
Addressed in ED

## 2015-09-09 NOTE — ED Notes (Signed)
Pt to xray     Renaldo Harrison, RN  09/09/15 [phone]

## 2015-09-09 NOTE — ED Notes (Signed)
Writer speaks with CT personnel to find out when pt is going to get a CT of abdomen. CT personnel reports pt is put in for transport right now to come to CT.      Sarita Bottom, RN  09/09/15 [phone]

## 2015-09-09 NOTE — ED Provider Notes (Signed)
[facility] ED  Emergency Department Encounter  Emergency Medicine Resident     Pt Name: David [name]  MRN: [id number]  Birthdate 06-23-1986  Date of evaluation: 09/09/15  PCP:  Cherylann Ratel, MD    CHIEF COMPLAINT       Chief Complaint   Patient presents with   ??? Emesis   ??? Abdominal Pain   ??? Cough   ??? Shortness of Breath   ??? Chest Pain       HISTORY OF PRESENT ILLNESS  (Location/Symptom, Timing/Onset, Context/Setting, Quality, Duration, Modifying Factors, Severity.)      David [name] is a 30 y.o. male who presents with multiple complaints.  Patient states that the symptoms began 6 hours ago.  It was acute in onset.  Patient states he woke up with a sweat.  He states that he felt febrile.  He attempted to take 2 ibuprofen but immediately threw them up.  Patient is having chest pain, shortness of breath, abdominal pain.  He states that when he was coughing, he felt that he couldn't breathe.  He is also having multiple bouts of emesis, no diarrhea.  Emesis is nonbloody and nonbilious.  Patient reports that he has had similar symptoms before in previous years and was told that he had flu.  He denies any sick contacts with similar symptoms.  He denies any cardiac disease.  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 Patient denies having hypercoagulable state, hemoptysis, cancer, hormonal use, recent travels, recent surgeries, previous DVT/PE.  Patient denies any testicular swelling or pain.  He denies any dysuria hematuria.    PAST MEDICAL / SURGICAL / SOCIAL / FAMILY HISTORY      has a past medical history of Back pain and Headache.    No PSH    Social History     Social History   ??? Marital status: Single     Spouse name: N/A   ??? Number of children: N/A   ??? Years of education: N/A     Occupational History   ??? Not on file.     Social History Main Topics   ??? Smoking status: Former Smoker     Quit date: 09/16/2012   ??? Smokeless tobacco: Not on file   ??? Alcohol use 0.0 oz/week     0  Standard drinks or equivalent per week   ??? Drug use: Yes     Special: Other-see comments      Comment: xanax   ??? Sexual activity: Yes     Partners: Female     Other Topics Concern   ??? Not on file     Social History Narrative       Family History   Problem Relation Age of Onset   ??? Cancer Other 90     possible colon cancer       Allergies:  Codeine    Home Medications:  Prior to Admission medications    Medication Sig Start Date End Date Taking? Authorizing Provider   gabapentin (NEURONTIN) 400 MG capsule Take 1 capsule by mouth 3 times daily 07/31/15   Cherylann Ratel, MD   cyclobenzaprine (FLEXERIL) 10 MG tablet Take 1 tablet by mouth 3 times daily as needed for Muscle spasms (only as needed) Causes sedation, do not drive while taking this medication 08/29/14   Cherylann Ratel, MD   ibuprofen (ADVIL;MOTRIN) 800 MG tablet Take 1 tablet by mouth every 8 hours as needed for Pain  08/29/14   Cherylann Ratel, MD   fexofenadine (ALLEGRA) 180 MG tablet Take 1 tablet by mouth daily 08/29/14   Cherylann Ratel, MD   fluticasone (FLONASE) 50 MCG/ACT nasal spray 2 sprays by Nasal route daily 08/29/14   Cherylann Ratel, MD   hydrOXYzine (ATARAX) 25 MG tablet Take 1 tablet by mouth every 12 hours as needed for Anxiety 08/29/14   Cherylann Ratel, MD   methadone 10 MG/5ML solution Take 110 mg by mouth daily  .    Historical Provider, MD       REVIEW OF SYSTEMS    (2-9 systems for level 4, 10 or more for level 5)      Review of Systems   Constitutional: Positive for diaphoresis and fever. Negative for chills.   HENT: Positive for rhinorrhea. Negative for congestion, ear pain, postnasal drip, sneezing and sore throat.    Eyes: Negative for discharge.   Respiratory: Positive for cough and shortness of breath.    Cardiovascular: Positive for chest pain.   Gastrointestinal: Negative for abdominal pain, nausea and vomiting.   Genitourinary: Negative for dysuria and testicular pain.   Musculoskeletal: Negative for neck pain  and neck stiffness.   Skin: Negative for rash.   Neurological: Positive for dizziness and light-headedness. Negative for headaches.       PHYSICAL EXAM   (up to 7 for level 4, 8 or more for level 5)      INITIAL VITALS:   Visit Vitals   ??? BP 135/82   ??? Pulse 104   ??? Temp 100 ??F (37.8 ??C) (Oral)   ??? Resp 14   ??? Ht  (1.727 m)   ??? Wt 230 lb (104.3 kg)   ??? SpO2 98%   ??? BMI 34.97 kg/m2       Physical Exam   Constitutional: He is oriented to person, place, and time. He appears well-developed and well-nourished.   HENT:   Head: Normocephalic and atraumatic.   Eyes: Conjunctivae and EOM are normal. Pupils are equal, round, and reactive to light.   Neck: Normal range of motion. Neck supple. No Brudzinski's sign and no Kernig's sign noted.   Cardiovascular: Normal rate, regular rhythm and normal heart sounds.    Pulmonary/Chest: Effort normal and breath sounds normal.   Abdominal: Soft. Bowel sounds are normal. He exhibits no distension. There is no tenderness. There is no rigidity, no rebound and no guarding.   Unable to elicit abdominal pain.   Genitourinary: Testes normal and penis normal. Cremasteric reflex is present. Right testis shows no mass, no swelling and no tenderness. Right testis is descended. Cremasteric reflex is not absent on the right side. Left testis shows no mass, no swelling and no tenderness. Left testis is descended. Cremasteric reflex is not absent on the left side. Circumcised.   Musculoskeletal: Normal range of motion.   Neurological: He is alert and oriented to person, place, and time. He has normal reflexes.   Skin: Skin is warm.   Nursing note and vitals reviewed.      DIFFERENTIAL  DIAGNOSIS     PLAN (LABS / IMAGING / EKG):  Orders Placed This Encounter   Procedures   ??? RAPID INFLUENZA A/B ANTIGENS   ??? Culture Blood #1   ??? Culture Blood #1   ??? XR Acute Abd Series Chest 1 VW   ??? CT ABDOMEN PELVIS W IV CONTRAST   ??? CBC Auto Differential   ??? BASIC METABOLIC PANEL   ??? LIPASE   ???  Hepatic  Function Panel   ??? POC PANEL (G3)-VEN   ??? POC Lactic Acid, Venous   ??? Inpatient consult to Hospitalist   ??? POC G4: LACTIC ACID,BLOOD GAS INCLUDES CALC. BASE EXCESS, SO2 Run All Components: Yes   ??? EKG 12 Lead   ??? Insert peripheral IV   ??? PATIENT STATUS (FROM ED OR OR/PROCEDURAL) Inpatient       MEDICATIONS ORDERED:  Orders Placed This Encounter   Medications   ??? promethazine (PHENERGAN) injection 25 mg   ??? 0.9 % sodium chloride bolus   ??? iohexol (OMNIPAQUE 350) solution 130 mL   ??? DISCONTD: levofloxacin (LEVAQUIN) tablet 750 mg   ??? DISCONTD: levofloxacin (LEVAQUIN) 750 MG tablet     Sig: Take 1 tablet by mouth daily for 4 days     Dispense:  4 tablet     Refill:  0   ??? 0.9 % sodium chloride bolus   ??? promethazine (PHENERGAN) injection 25 mg   ??? azithromycin (ZITHROMAX) 500 mg in dextrose 5 % 250 mL IVPB   ??? cefTRIAXone (ROCEPHIN) 1 g IVPB in 50 mL D5W minibag       DDX: He presents with multiple complaints, chest pain, shortness of breath, abdominal pain.  Patient was minimally tender on the left upper quadrant, would pursue an abdominal workup at this time.  Patient is also having chest pain and shortness breath.  The suspicion for ACS and PE is lower at this time.  Patient did used to use recreational drugs in the past, the suspicion for endocarditis low at this time.  Patient symptoms most likely related to a viral illness.  We'll reassess after workup    DIAGNOSTIC RESULTS / EMERGENCY DEPARTMENT COURSE / MDM     LABS:  Results for orders placed or performed during the hospital encounter of 09/09/15   RAPID INFLUENZA A/B ANTIGENS   Result Value Ref Range    Specimen Description .NASOPHARYNGEAL SWAB     Special Requests NOT REPORTED     Direct Exam PRESUMPTIVE NEGATIVE for Influenza A + B antigens.     Direct Exam       PCR testing to confirm this result is available upon request.  Specimen will be    Direct Exam        saved in the laboratory for 7 days.  Please call [phone] if PCR testing is    Direct Exam   indicated.     Direct Exam       [employer] [address], [place] [address] [phone]    Status FINAL 09/09/2015    CBC Auto Differential   Result Value Ref Range    WBC 17.3 (H) 3.5 - 11.0 k/uL    RBC 5.28 4.5 - 5.9 m/uL    Hemoglobin 15.1 13.5 - 17.5 g/dL    Hematocrit 13.0 41 - 53 %    MCV 83.1 80 - 100 fL    MCH 28.6 26 - 34 pg    MCHC 34.5 31 - 37 g/dL    RDW 86.5 78.4 - 69.6 %    Platelets 217 140 - 450 k/uL    MPV 8.2 6.0 - 12.0 fL    Differential Type NOT REPORTED     WBC Morphology NOT REPORTED     RBC Morphology NOT REPORTED     Platelet Estimate NOT REPORTED     Seg Neutrophils 91 (H) 36 - 66 %    Lymphocytes 4 (L) 24 - 44 %  Monocytes 5 2 - 11 %    Eosinophils Relative Percent 0 (L) 1 - 4 %    Basophils 0 0 - 2 %    Segs Absolute 15.70 (H) 1.8 - 7.7 k/uL    Absolute Lymph # 0.70 (L) 1.0 - 4.8 k/uL    Absolute Mono # 0.80 0.1 - 1.2 k/uL    Absolute Eos # 0.00 0.0 - 0.4 k/uL    Basophils # 0.00 0.0 - 0.2 k/uL   BASIC METABOLIC PANEL   Result Value Ref Range    Glucose 129 (H) 70 - 99 mg/dL    BUN 12 6 - 20 mg/dL    CREATININE 2.13 0.86 - 1.20 mg/dL    Bun/Cre Ratio NOT REPORTED 9 - 20    Calcium 9.1 8.6 - 10.4 mg/dL    Sodium 578 (L) 469 - 144 mmol/L    Potassium 4.5 3.7 - 5.3 mmol/L    Chloride 94 (L) 98 - 107 mmol/L    CO2 25 20 - 31 mmol/L    Anion Gap 13 9 - 17 mmol/L    GFR Non-African American >60 >60 mL/min    GFR African American >60 >60 mL/min    GFR Comment          GFR Staging NOT REPORTED    LIPASE   Result Value Ref Range    Lipase 24 13 - 60 U/L   Hepatic Function Panel   Result Value Ref Range    Alb 4.4 3.5 - 5.2 g/dL    Alkaline Phosphatase 96 40 - 129 U/L    ALT 49 (H) 5 - 41 U/L    AST 39 <40 U/L    Total Bilirubin 0.40 0.3 - 1.2 mg/dL    Bilirubin, Direct 6.29 <0.31 mg/dL    Bilirubin, Indirect 0.30 0.00 - 1.00 mg/dL    Total Protein 7.8 6.4 - 8.3 g/dL    Globulin NOT REPORTED 1.5 - 3.8 g/dL    Albumin/Globulin Ratio 1.3 1.0 - 2.5   POC PANEL (G3)-VEN   Result Value Ref  Range    pH, Ven 7.32 7.32 - 7.42    pCO2, Ven 54 39 - 55 mm Hg    pO2, Ven 16 (L) 30 - 50 mm Hg    Total CO2, Venous 30 25 - 31 mm Hg    HCO3, Venous 28.1 24 - 30 mmol/L    Negative Base Excess, Ven NOT REPORTED 0.0 - 2.0    Positive Base Excess, Ven 2 0.0 - 2.0    O2 Sat, Ven 18 %    Pt Temp NOT REPORTED     O2 Device/Flow/% NOT REPORTED     FIO2 NOT REPORTED     POC pH Temp NOT REPORTED     POC pCO2 Temp NOT REPORTED mm Hg    POC pO2 Temp NOT REPORTED mm Hg   POC Lactic Acid, Venous   Result Value Ref Range    POC Lactic Acid, VEN 1.29 0.90 - 1.70 mmol/L       IMPRESSION: Right lower lobe pneumonia, abdominal pain    Reviewed imaging and laboratory studies, there appears to be a consolidation on the right lower lobe.  That would explain patient's.  Patient was having chest pain or shortness of breath.  CT imaging did not show any acute pathology in the abdomen.  Initial abdominal series shows possible small bowel obstruction, the CT imaging did not show bowel instruction.    RADIOLOGY:  None    EKG  EKG Interpretation    Interpreted by me    Rhythm: normal sinus   Rate: Sinus tachycardia @ rate of 106  Axis: normal  Ectopy: none  Conduction: normal  ST Segments: no acute change  T Waves: no acute change  Q Waves: none    Clinical Impression: Sinus tachycardia    All EKG's are interpreted by the Emergency Department Physician who either signs or Co-signs this chart in the absence of a cardiologist.    EMERGENCY DEPARTMENT COURSE:  10:57 AM  Patient reports that he had an episode of emesis, unable to tolerate antibiotics.  Since patient is unable to tolerate antibiotics we will admit to internal medicine service.  Patient does meet SIRS criteria, would obtain blood cultures and a lactic acid at this time.  We will administer fluids and administered IV antibiotics.    12:42 PM  Discussed with Dr. Tish Frederickson, will admit patient for further monitoring.     PROCEDURES:  None    CONSULTS:  IP CONSULT TO  HOSPITALIST    CRITICAL CARE:  None    FINAL IMPRESSION      1. Pneumonia of right lung due to infectious organism, unspecified part of lung    2. Lower abdominal pain          DISPOSITION / PLAN     DISPOSITION Admitted    PATIENT REFERRED TO:  Cherylann Ratel, MD  [address], [address]  [place] [address]  [phone]    Call today  for reassessment of pneumonia    [facility] ED  [address]  [place] [address]  [phone]  Go to  If symptoms worsen      DISCHARGE MEDICATIONS:  New Prescriptions    No medications on file       Catalina Lunger, DO  Emergency Medicine Resident    (Please note that portions of this note were completed with a voice recognition program.  Efforts were made to edit the dictations but occasionally words are mis-transcribed.)       Jarrett Soho, DO  Resident  09/09/15 [phone]

## 2015-09-09 NOTE — Progress Notes (Addendum)
Patient telemetry became unhooked. RN went into room to connect leads, patient laying on stomach and refused telemetry. RN educated. Patient refused stated he "just got comfortable". Patient also states he does not want to take medication at this time and he will wait until later.  Will continue to monitor.

## 2015-09-09 NOTE — ED Notes (Signed)
Report to 5A.      Katina Dung, RN  09/09/15 [phone]

## 2015-09-09 NOTE — Progress Notes (Signed)
Smoking Cessation - topics covered     Health Risks    Benefits of Quitting     Smoking Cessation    Patient has no history of tobacco use    Patient is former smoker.  Patient quit in 2014.    No need for tobacco cessation education.     Booklet given    Patient verbalizes understanding.    Patient denies need for tobacco cessation education.  Vickie Ponds  2:10 PM

## 2015-09-09 NOTE — ED Notes (Signed)
Pt to ER with multiple complaints. Pt states that he is having a runny nose, productive coughing producing "thick, gross stuff", chest pain, dizziness, SOB, left sided abd pain and tenderness, vomiting and hot flashes, pt states "i feel like I'm 110 degrees". Pt states that all symptoms began at midnight and have prevented him from going to sleep. Pt is alert and oriented and answering questions appropriately. Pt is in NAD. Dr. Mohammed Kindle at bedside for eval. Will continue to monitor     Renaldo Harrison, RN  09/09/15 [phone]

## 2015-09-09 NOTE — ED Notes (Signed)
Pt resting in cot with eyes close-respirations even and non-labored. Awaiting CT of abdomen. Will cont to monitor.      Sarita Bottom, RN  09/09/15 [phone]

## 2015-09-09 NOTE — ED Provider Notes (Addendum)
[facility] [name] [facility] ED     Emergency Department     Faculty Attestation        I performed a history and physical examination of the patient and discussed management with the resident. I reviewed the resident???s note and agree with the documented findings and plan of care. Any areas of disagreement are noted on the chart. I was personally present for the key portions of any procedures. I have documented in the chart those procedures where I was not present during the key portions. I have reviewed the emergency nurses triage note. I agree with the chief complaint, past medical history, past surgical history, allergies, medications, social and family history as documented unless otherwise noted below.  For Physician Assistant/ Nurse Practitioner cases/documentation I have personally evaluated this patient and have completed at least one if not all key elements of the E/M (history, physical exam, and MDM). Additional findings are as noted.    Vital Signs:BP: (!) 148/84   Pulse: 101   Resp: 18   Temp: 100 ??F (37.8 ??C)  SpO2: 96 %  PCP:  Cherylann Ratel, MD    Pertinent Comments:     Patient is a 30 year old male whose only past medical problem is previous narcotic use and is on methadone.    The patient woke up suddenly at midnight with nausea and vomiting as well as some intermittent abdominal cramping and some sharp side left abdominal pain.    Denies any back pain or testicle pain.    During some episodes of vomiting did feel short of breath with some chest discomfort as well.    Has been having subjective fevers and chills as well.    Denies rashes or headache/neck pain    Physical Exam   HENT:   Mildly dry mucous membranes   Cardiovascular: Regular rhythm, normal heart sounds and intact distal pulses.  Exam reveals no friction rub.    No murmur heard.  Pulmonary/Chest: Breath sounds normal. No respiratory distress. He has no wheezes. He has no rales.   Abdominal:  Soft. Bowel sounds are normal. He exhibits no distension and no mass (No obvious pulsatile masses palpated). There is tenderness (positive mild tenderness on the left lower quadrant but no McBurney's point or right lower quadrant pain and no bruising or rash seen.). There is no rebound and no guarding.   Musculoskeletal:   Strong DP/PT pulses are palpated and equal bilateral     Assessment/Plan   Probable viral syndrome/nausea/vomiting/abdominal pain.    We'll check abdominal laboratories as well as brief cardiac evaluation and reevaluate after        Critical Care  None    Note, if the patient's blood pressure was elevated, and they have no history of hypertension, they were informed of the following:  The patient may have Pre-hypertension/Hypertension: The patient has been informed that they may have pre-hypertension or Hypertension based on a blood pressure reading in the emergency department. I recommend that the patient call the primary care provider listed on their discharge instructions or a physician of their choice this week to arrange follow up for further evaluation of possible pre-hypertension or Hypertension.    (Please note that portions of this note were completed with a voice recognition program. Efforts were made to edit the dictations but occasionally words are mis-transcribed. Whenever words are used in this note in any gender, they shall be construed as though they were used in the gender appropriate to the circumstances; and whenever  words are used in this note in the singular or plural form, they shall be construed as though they were used in the form appropriate to the circumstances.)    Chevis Pretty, MD [name]  Attending Emergency Medicine Physician            Salomon Mast, MD  09/09/15 1610       Salomon Mast, MD  09/09/15 [phone]

## 2015-09-09 NOTE — Progress Notes (Signed)
Addressed in ED. currently admitted.

## 2015-09-09 NOTE — ED Notes (Addendum)
Report received from [facility]. Pt resting in cot with eyes closed-respirations even and non-labored.      Sarita Bottom, RN  09/09/15 1610       Sarita Bottom, RN  09/09/15 [phone]

## 2015-09-09 NOTE — Progress Notes (Signed)
Addressed in ED. Partial SBO

## 2015-09-10 LAB — CBC WITH AUTO DIFFERENTIAL
Absolute Eos #: 0 10*3/uL (ref 0.0–0.4)
Absolute Lymph #: 1.4 10*3/uL (ref 1.0–4.8)
Absolute Mono #: 1.2 10*3/uL (ref 0.1–1.2)
Basophils Absolute: 0 10*3/uL (ref 0.0–0.2)
Basophils: 0 % (ref 0–2)
Eosinophils %: 0 % — ABNORMAL LOW (ref 1–4)
Hematocrit: 39.7 % — ABNORMAL LOW (ref 41–53)
Hemoglobin: 13.7 g/dL (ref 13.5–17.5)
Lymphocytes: 8 % — ABNORMAL LOW (ref 24–44)
MCH: 28.8 pg (ref 26–34)
MCHC: 34.6 g/dL (ref 31–37)
MCV: 83.3 fL (ref 80–100)
MPV: 8 fL (ref 6.0–12.0)
Monocytes: 7 % (ref 2–11)
Platelets: 191 10*3/uL (ref 140–450)
RBC: 4.77 m/uL (ref 4.5–5.9)
RDW: 13.7 % (ref 12.5–15.4)
Seg Neutrophils: 85 % — ABNORMAL HIGH (ref 36–66)
Segs Absolute: 14.6 10*3/uL — ABNORMAL HIGH (ref 1.8–7.7)
WBC: 17.3 10*3/uL — ABNORMAL HIGH (ref 3.5–11.0)

## 2015-09-10 LAB — COMPREHENSIVE METABOLIC PANEL
ALT: 38 U/L (ref 5–41)
AST: 25 U/L (ref <40)
Albumin/Globulin Ratio: 1.1 (ref 1.0–2.5)
Albumin: 3.8 g/dL (ref 3.5–5.2)
Alkaline Phosphatase: 82 U/L (ref 40–129)
Anion Gap: 11 mmol/L (ref 9–17)
BUN: 12 mg/dL (ref 6–20)
CO2: 26 mmol/L (ref 20–31)
Calcium: 8.8 mg/dL (ref 8.6–10.4)
Chloride: 99 mmol/L (ref 98–107)
Creatinine: 0.65 mg/dL — ABNORMAL LOW (ref 0.70–1.20)
GFR African American: >60 mL/min (ref >60)
GFR Non-African American: >60 mL/min (ref >60)
Glucose: 125 mg/dL — ABNORMAL HIGH (ref 70–99)
Potassium: 3.9 mmol/L (ref 3.7–5.3)
Sodium: 136 mmol/L (ref 135–144)
Total Bilirubin: 0.35 mg/dL (ref 0.3–1.2)
Total Protein: 7.2 g/dL (ref 6.4–8.3)

## 2015-09-10 MED ORDER — ONDANSETRON HCL 4 MG PO TABS
4 MG | Freq: Once | ORAL | Status: DC
Start: 2015-09-10 — End: 2015-09-11

## 2015-09-10 MED ORDER — PROMETHAZINE HCL 25 MG PO TABS
25 MG | Freq: Four times a day (QID) | ORAL | Status: DC | PRN
Start: 2015-09-10 — End: 2015-09-11
  Administered 2015-09-10 – 2015-09-11 (×4): 12.5 mg via ORAL

## 2015-09-10 MED FILL — CYCLOBENZAPRINE HCL 10 MG PO TABS: 10 MG | ORAL | Qty: 1

## 2015-09-10 MED FILL — CEFTRIAXONE SODIUM 1 G IJ SOLR: 1 g | INTRAMUSCULAR | Qty: 1

## 2015-09-10 MED FILL — PROMETHAZINE HCL 25 MG PO TABS: 25 MG | ORAL | Qty: 1

## 2015-09-10 MED FILL — MAPAP 325 MG PO TABS: 325 MG | ORAL | Qty: 2

## 2015-09-10 MED FILL — LOVENOX 40 MG/0.4ML SC SOLN: 40 MG/0.4ML | SUBCUTANEOUS | Qty: 0.4

## 2015-09-10 MED FILL — PROMETHAZINE HCL 25 MG PO TABS: 25 mg | ORAL | Qty: 1

## 2015-09-10 MED FILL — GABAPENTIN 400 MG PO CAPS: 400 MG | ORAL | Qty: 1

## 2015-09-10 MED FILL — ONDANSETRON HCL 4 MG PO TABS: 4 MG | ORAL | Qty: 1

## 2015-09-10 MED FILL — PROMETHAZINE HCL 25 MG/ML IJ SOLN: 25 MG/ML | INTRAMUSCULAR | Qty: 1

## 2015-09-10 MED FILL — ZITHROMAX 500 MG IV SOLR: 500 mg | INTRAVENOUS | Qty: 500

## 2015-09-10 MED FILL — METHADONE HCL 5 MG/5ML PO SOLN: 5 MG/ML | ORAL | Qty: 110

## 2015-09-10 MED FILL — HYDROXYZINE HCL 25 MG PO TABS: 25 MG | ORAL | Qty: 1

## 2015-09-10 MED FILL — CETIRIZINE HCL 10 MG PO TABS: 10 MG | ORAL | Qty: 1

## 2015-09-10 NOTE — Plan of Care (Signed)
Problem: Airway Clearance - Ineffective:  Goal: Ability to maintain a clear airway will improve  Ability to maintain a clear airway will improve   Outcome: Ongoing

## 2015-09-10 NOTE — Care Coordination-Inpatient (Signed)
Case Management Initial Discharge Plan  David [name],         Readmission Risk              Readmission Risk:        8.25       Age 30 or Greater:  0    Admitted from SNF or Requires Paid or Family Care:  0    Currently has CHF,COPD,ARF,CRI,or is on dialysis:  0    Takes more than 5 Prescription Medications:  4    Takes Digoxin,Insulin,Anticoagulants,Narcotics or ASA/Plavix:  [place] in Past 12 Months:  0    On Disability:  3    Patient Considers own Health:  1.25            Met with:patient to discuss discharge plans.   Information verified: address, contacts, phone number, DOB, insurance Yes  PCP: Salley Scarlet, MD  Date of last visit:  6 mos    Insurance Provider: [facility]    Discharge Planning  Current Residence:   Private residence  Living Arrangements:   With mother   Home has 2 stories  Support Systems:   family  Current Services PTA:   NA Supplier: NA  Patient able to perform ADL's:Independent  DME used to aid ambulation prior to admission: NA/during admission-NA    Potential Assistance Needed:   none    Pharmacy: [facility]   Potential Assistance Purchasing Medications:   no  Does patient want to participate in local refill/ meds to beds program?   no    Patient agreeable to home care: NA  Freedom of choice provided:  n/a      Type of Home Care Services:     Patient expects to be discharged to:       Prior SNF/Rehab Placement and Facility: NO  Agreeable to SNF/Rehab: NA  Freedom of choice provided: n/a  Social Services Evaluation: n/a    Expected Discharge date:   2/23  Follow Up Appointment: Best Day/ Time:     Transportation provider: self  Transportation arrangements needed for discharge: No-family    Discharge Plan: home independently,  Discussed with Dr Albertine Grates- plan for home with po ABX, no home care needs        Electronically signed by Opal Sidles, RN on 09/10/15 at 3:11 PM

## 2015-09-10 NOTE — Plan of Care (Signed)
Problem: Discharge Planning:  Goal: Discharged to appropriate level of care  Discharged to appropriate level of care   Outcome: Ongoing  Goal: Participates in care planning  Participates in care planning   Outcome: Ongoing    Problem: Airway Clearance - Ineffective:  Goal: Clear lung sounds  Clear lung sounds   Outcome: Ongoing  Goal: Ability to maintain a clear airway will improve  Ability to maintain a clear airway will improve   Outcome: Ongoing    Problem: Fluid Volume - Deficit:  Goal: Achieves intake and output within specified parameters  Achieves intake and output within specified parameters   Outcome: Ongoing    Problem: Gas Exchange - Impaired:  Goal: Levels of oxygenation will improve  Levels of oxygenation will improve   Outcome: Ongoing

## 2015-09-10 NOTE — Progress Notes (Signed)
Paged Dr. Ninfa Linden regarding pt having pain and pressure to L calf. Awaiting call back.

## 2015-09-10 NOTE — Progress Notes (Signed)
Patient IV went bad. Patient refused a new IV start. RN educated, patient still refused. Will continue to monitor.

## 2015-09-10 NOTE — Progress Notes (Signed)
Paged Dr. Ninfa Linden regarding pt wanting pill form of methadone since liquid is too much for him to take and is nauseated. Awaiting call back or orders.

## 2015-09-10 NOTE — Progress Notes (Signed)
Addressed in ED  currently admitted. pneumonia
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
## 2015-09-11 MED ORDER — AZITHROMYCIN 500 MG PO TABS
500 MG | ORAL_TABLET | Freq: Every day | ORAL | 0 refills | Status: AC
Start: 2015-09-11 — End: 2015-09-16

## 2015-09-11 MED FILL — GABAPENTIN 400 MG PO CAPS: 400 MG | ORAL | Qty: 1

## 2015-09-11 MED FILL — CETIRIZINE HCL 10 MG PO TABS: 10 MG | ORAL | Qty: 1

## 2015-09-11 MED FILL — CYCLOBENZAPRINE HCL 10 MG PO TABS: 10 MG | ORAL | Qty: 1

## 2015-09-11 MED FILL — AZITHROMYCIN 500 MG IV SOLR: 500 mg | INTRAVENOUS | Qty: 500

## 2015-09-11 MED FILL — PROMETHAZINE HCL 25 MG PO TABS: 25 MG | ORAL | Qty: 1

## 2015-09-11 MED FILL — MAPAP 325 MG PO TABS: 325 MG | ORAL | Qty: 2

## 2015-09-11 MED FILL — METHADONE HCL 5 MG/5ML PO SOLN: 5 MG/ML | ORAL | Qty: 110

## 2015-09-11 MED FILL — HYDROXYZINE HCL 25 MG PO TABS: 25 MG | ORAL | Qty: 1

## 2015-09-11 MED FILL — LOVENOX 40 MG/0.4ML SC SOLN: 40 MG/0.4ML | SUBCUTANEOUS | Qty: 0.4

## 2015-09-11 NOTE — Discharge Instructions (Signed)
As tolerated
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
## 2015-09-11 NOTE — Discharge Summary (Signed)
[facility]   IN-PATIENT SERVICE   [facility]    Discharge Summary     Patient ID: David [name]  DOB:  [birth year]   MRN: [id number]     ACCOUNT:  [id number]   Patient Location : [id number]  Patient's PCP: Cherylann Ratel, MD  Admit Date: 09/09/2015   Discharge Date: 09/11/2015     Length of Stay: 2  Code Status:  Prior  Admitting Physician: Marry Guan, DO  Discharge Physician: Arnold Long, MD     Active Discharge Diagnoses:     Primary Problem  Pneumonia of right lung due to infectious organism      Hospital Problems  Active Hospital Problems    Diagnosis Date Noted   ??? Pneumonia of right lung due to infectious organism [J18.9] 09/09/2015   ??? Opioid dependence in remission (HCC) [F11.21] 09/09/2015   ??? Lower abdominal pain [R10.30]    ??? Obesity (BMI 30-39.9) [E66.9] 08/31/2014   ??? Chronic bilateral low back pain without sciatica [M54.5, G89.29] 11/20/2013   ??? Chronic sinusitis [J32.9] 04/06/2013       Admission Condition:  Fair     Discharged Condition: good    Hospital Stay:     Hospital Course:  David [name] is a 30 y.o. male who was admitted for the management of   Pneumonia of right lung due to infectious organism , presented to ER with Emesis; Abdominal Pain; Cough; Shortness of Breath; and Chest Pain    Patient was admitted with R pneumonia . Was having high grade fever abd cough for 1 day prior to presentation . Evaluation showed leucocytosis , CXR showing RLL Pneumonia .  Treated with IV Rocephin and azithromycin and improved.  He was having persistent nausea vomiting and was treated with IV hydration and antibiotics.  Patient has history of opioid dependence and is on methadone treatment was continued during inpatient stay.      Significant therapeutic interventions:   1. RLL pneumonia , neg for Flu -  azithromycin and rocephin for CAP .  In the hospital .  Oral azithromycin at discharge   2. Nausea and vomiting - - supportive care , IV hydration,  antiemetics  3. Headache and fever - tylenol as needed   4. Chronic opioid dependence - continue  methadone -   5. Chronic back pain - on neurontin . Marland Kitchen  ??    Significant Diagnostic Studies:   Labs / Micro:/Radiology  Recent Labs      09/10/15   0535  09/09/15   0636   WBC  17.3*  17.3*   HGB  13.7  15.1   HCT  39.7*  43.9   MCV  83.3  83.1   PLT  191  217     Labs Renal Latest Ref Rng & Units 07/18/2012 07/24/2013 04/13/2015 09/09/2015 09/10/2015   BUN 6 - 20 mg/dL Cr 0.70 - 1.20 mg/dL 4.54 0.98 1.19(J) 4.78 0.65(L)   K 3.7 - 5.3 mmol/L 4.4 4.2 4.5 4.5 3.9   Na 135 - 144 mmol/L 142 140 141 132(L) 136     Lab Results   Component Value Date    ALT 38 09/10/2015    AST 25 09/10/2015    ALKPHOS 82 09/10/2015    BILITOT 0.35 09/10/2015     Lab Results   Component Value Date    TSH 0.63 02/21/2012     Lab Results   Component Value  Date    HEPAIGM NONREACTIVE 02/21/2012    HEPBIGM NONREACTIVE 02/21/2012    HEPCAB NONREACTIVE 07/24/2013     Lab Results   Component Value Date    COLORU AMBER 04/11/2012    NITRU NEGATIVE 04/11/2012    GLUCOSEU NEGATIVE 04/11/2012    KETUA LARGE 04/11/2012    UROBILINOGEN Normal 04/11/2012    BILIRUBINUR  04/11/2012     Presumptive positive. Unable to confirm due to unavailability of reagent.     CXR - 09/09/15   ?? ?? ??   Impression: ?? ????   ???? Centrilobular mixed density ground-glass and nodular opacities in the right  lower lobe, while nonspecific, tree-in-bud configuration favors pneumonia.    Hepatic steatosis.??   ????  XR acute abd chest   ?? ?? ??   Impression: ?? ????   ???? 1. Several nonspecific air-fluid levels within mildly dilated small bowel  loops in the upper abdomen measuring up to 3.7 cm. ??Partial small bowel  obstruction is difficult to exclude on the basis of this exam. ??Please  consider further evaluation with a dedicated CT abdomen pelvis.  2. No acute focal airspace consolidation.  3. Mild to moderate stool burden.   ??      Consultations:    Consults:     Final Specialist  Recommendations/Findings:   IP CONSULT TO HOSPITALIST  IP CONSULT TO IV TEAM      The patient was seen and examined on day of discharge and this discharge summary is in conjunction with any daily progress note from day of discharge.    Discharge plan:     Disposition: Home    Physician Follow Up: Cherylann Ratel, MD  [address] [place] [address]  [phone]    Call today  for reassessment of pneumonia    [facility] ED  [address]  [place] [address]  [phone]  Go to  If symptoms worsen       Requiring Further Evaluation/Follow Up POST HOSPITALIZATION/Incidental Findings:       Instructions to Patient::     Completed treatment, antibiotics as advised  I have reviewed the instructions with the patient, answering all questions to his satisfaction.    Diet: regular diet    Activity: As tolerated     Discharge Medications:      Medication List      START taking these medications          azithromycin 500 MG tablet   Commonly known as:  ZITHROMAX   Take 1 tablet by mouth daily for 5 days         CONTINUE taking these medications          cyclobenzaprine 10 MG tablet   Commonly known as:  FLEXERIL   Take 1 tablet by mouth 3 times daily as needed for Muscle spasms (only as needed) Causes sedation, do not drive while taking this medication       fexofenadine 180 MG tablet   Commonly known as:  ALLEGRA   Take 1 tablet by mouth daily       fluticasone 50 MCG/ACT nasal spray   Commonly known as:  FLONASE   2 sprays by Nasal route daily       gabapentin 400 MG capsule   Commonly known as:  NEURONTIN   Take 1 capsule by mouth 3 times daily       hydrOXYzine 25 MG tablet   Commonly known as:  ATARAX   Take 1 tablet by mouth every 12 hours as needed for Anxiety       ibuprofen 800 MG tablet   Commonly known as:  ADVIL;MOTRIN   Take 1 tablet by mouth every 8 hours as needed for Pain       methadone 10 MG/5ML solution            Where to Get Your Medications      These medications  were sent to [id number] - [place], [place] - [address] - P [phone] Carmon Ginsberg [phone]  [address], [place] [address]     Phone:  [phone]    ??? azithromycin 500 MG tablet             Time Spent on discharge is  25 mins in patient examination, evaluation, counseling as well as medication reconciliation, prescriptions for required medications, discharge plan and follow up.    Electronically signed by   Arnold Long, MD        Thank you Dr. Cherylann Ratel, MD for the opportunity to be involved in this patient's care.

## 2015-09-11 NOTE — Discharge Instructions (Signed)
Good nutrition is important when healing from an illness, injury, or surgery. Follow any nutrition recommendations given to you during the hospital stay. If you are instructed to take an oral nutrition supplement at home, you can take it with meals, in-between meals, and/or before bedtime. These supplements can be purchased at most local grocery stores, pharmacies, and chain super-stores. If you need help in covering the cost of your medication or oral supplement, visit www.RxAssist.org. If you have any questions about your diet or nutrition, call the hospital and ask for the dietitian.       Your nutrition orders at the time of discharge were:  DIET GENERAL;.

## 2015-09-11 NOTE — Plan of Care (Signed)
Problem: Discharge Planning:  Goal: Discharged to appropriate level of care  Discharged to appropriate level of care   Outcome: Ongoing  Goal: Participates in care planning  Participates in care planning   Outcome: Ongoing    Problem: Airway Clearance - Ineffective:  Goal: Clear lung sounds  Clear lung sounds   Outcome: Ongoing  Goal: Ability to maintain a clear airway will improve  Ability to maintain a clear airway will improve   Outcome: Ongoing    Problem: Fluid Volume - Deficit:  Goal: Achieves intake and output within specified parameters  Achieves intake and output within specified parameters   Outcome: Ongoing    Problem: Gas Exchange - Impaired:  Goal: Levels of oxygenation will improve  Levels of oxygenation will improve   Outcome: Ongoing

## 2015-09-12 ENCOUNTER — Telehealth

## 2015-09-12 LAB — EKG 12-LEAD
Atrial Rate: 106 {beats}/min
P Axis: 57 degrees
P-R Interval: 146 ms
Q-T Interval: 320 ms
QRS Duration: 92 ms
QTc Calculation (Bazett): 425 ms
R Axis: 20 degrees
T Axis: 39 degrees
Ventricular Rate: 106 {beats}/min

## 2015-09-12 MED ORDER — PROMETHAZINE HCL 25 MG PO TABS
25 MG | ORAL_TABLET | Freq: Three times a day (TID) | ORAL | 0 refills | Status: AC | PRN
Start: 2015-09-12 — End: 2015-09-15

## 2015-09-12 NOTE — Telephone Encounter (Signed)
Patient discharged yesterday from [facility] for Pneumonia. Patient said AB is making him nauseated he wants to know if he can get some Phenergan. Please Advice  Health Maintenance   Topic Date Due   ??? DTaP/Tdap/Td vaccine (1 - Tdap) 09/23/2003   ??? Flu vaccine (1) 02/17/2015   ??? HIV screen  Completed       No results found for: LABA1C          ( goal A1C is < 7)   No results found for: LABMICR  LDL Cholesterol (mg/dL)   Date Value   16/04/9603 94       (goal LDL is <100)   AST (U/L)   Date Value   09/10/2015 25     ALT (U/L)   Date Value   09/10/2015 38     BUN (mg/dL)   Date Value   54/03/8118 12     BP Readings from Last 3 Encounters:   09/11/15 107/70   04/13/15 159/80   08/29/14 124/84          (goal 120/80)    All Future Testing planned in [facility]  Lab Frequency Next Occurrence       Next Visit Date:  Future Appointments  Date Time Provider Department Center   09/18/2015 11:00 AM Cherylann Ratel, MD [facility]            Patient Active Problem List:     Fatigue     Constipation     Chronic sinusitis     Migraine     Chronic bilateral low back pain without sciatica     Postnasal drip     Anxiety     Neck pain     Allergic rhinitis     Obesity (BMI 30-39.9)     Pneumonia of right lung due to infectious organism     Opioid dependence in remission [facility])     Lower abdominal pain

## 2015-09-12 NOTE — Telephone Encounter (Signed)
Please let the patient know to pick prescription from pharmacy.  Requested Prescriptions     Signed Prescriptions Disp Refills   ??? promethazine (PHENERGAN) 25 MG tablet 12 tablet 0     Sig: Take 1 tablet by mouth every 8 hours as needed for Nausea     Authorizing Provider: Cherylann Ratel         Thank you!

## 2015-09-12 NOTE — Telephone Encounter (Signed)
Voicemail left for patient informing him to check with his pharmacy.

## 2015-09-15 LAB — CULTURE BLOOD #1
Culture: NO GROWTH
Culture: NO GROWTH

## 2015-09-18 ENCOUNTER — Encounter: Attending: Family Medicine | Primary: Family

## 2015-09-22 ENCOUNTER — Encounter: Payer: PRIVATE HEALTH INSURANCE | Attending: Family | Primary: Family

## 2015-11-24 ENCOUNTER — Inpatient Hospital Stay
Admit: 2015-11-24 | Discharge: 2015-11-24 | Disposition: A | Discharge status: Home / Self Care | Payer: PRIVATE HEALTH INSURANCE | Attending: Emergency Medicine

## 2015-11-24 ENCOUNTER — Emergency Department: Admit: 2015-11-24 | Payer: PRIVATE HEALTH INSURANCE | Primary: Family

## 2015-11-24 DIAGNOSIS — K920 Hematemesis: Secondary | ICD-10-CM

## 2015-11-24 LAB — CBC WITH AUTO DIFFERENTIAL
Absolute Eos #: 0.2 10*3/uL (ref 0.0–0.4)
Absolute Lymph #: 2.5 10*3/uL (ref 1.0–4.8)
Absolute Mono #: 0.7 10*3/uL (ref 0.1–1.2)
Basophils Absolute: 0 10*3/uL (ref 0.0–0.2)
Basophils: 0 %
Eosinophils %: 2 %
Hematocrit: 46.1 % (ref 41–53)
Hemoglobin: 15.4 g/dL (ref 13.5–17.5)
Lymphocytes: 27 %
MCH: 28.5 pg (ref 26–34)
MCHC: 33.4 g/dL (ref 31–37)
MCV: 85.6 fL (ref 80–100)
MPV: 8.1 fL (ref 6.0–12.0)
Monocytes: 8 %
Platelets: 250 10*3/uL (ref 140–450)
RBC: 5.39 m/uL (ref 4.5–5.9)
RDW: 13.7 % (ref 12.5–15.4)
Seg Neutrophils: 63 %
Segs Absolute: 6 10*3/uL (ref 1.8–7.7)
WBC: 9.4 10*3/uL (ref 3.5–11.0)

## 2015-11-24 LAB — BASIC METABOLIC PANEL
Anion Gap: 13 mmol/L (ref 9–17)
BUN: 12 mg/dL (ref 6–20)
CO2: 23 mmol/L (ref 20–31)
Calcium: 9.3 mg/dL (ref 8.6–10.4)
Chloride: 102 mmol/L (ref 98–107)
Creatinine: 0.75 mg/dL (ref 0.70–1.20)
GFR African American: >60 mL/min (ref >60)
GFR Non-African American: >60 mL/min (ref >60)
Glucose: 115 mg/dL — ABNORMAL HIGH (ref 70–99)
Potassium: 4.8 mmol/L (ref 3.7–5.3)
Sodium: 138 mmol/L (ref 135–144)

## 2015-11-24 LAB — LIPASE: Lipase: 27 U/L (ref 13–60)

## 2015-11-24 LAB — HEPATIC FUNCTION PANEL
ALT: 60 U/L — ABNORMAL HIGH (ref 5–41)
AST: 44 U/L — ABNORMAL HIGH (ref <40)
Albumin/Globulin Ratio: 1.1 (ref 1.0–2.5)
Albumin: 4.2 g/dL (ref 3.5–5.2)
Alkaline Phosphatase: 93 U/L (ref 40–129)
Bilirubin, Direct: 0.08 mg/dL (ref <0.31)
Bilirubin, Indirect: 0.12 mg/dL (ref 0.00–1.00)
Total Bilirubin: 0.2 mg/dL — ABNORMAL LOW (ref 0.3–1.2)
Total Protein: 8 g/dL (ref 6.4–8.3)

## 2015-11-24 MED ORDER — SODIUM CHLORIDE 0.9 % IV BOLUS
0.9 % | Freq: Once | INTRAVENOUS | Status: AC
Start: 2015-11-24 — End: 2015-11-24
  Administered 2015-11-24: 12:00:00 1000 mL via INTRAVENOUS

## 2015-11-24 MED ORDER — PROMETHAZINE HCL 25 MG/ML IJ SOLN
25 MG/ML | Freq: Once | INTRAMUSCULAR | Status: AC
Start: 2015-11-24 — End: 2015-11-24
  Administered 2015-11-24: 12:00:00 12.5 mg via INTRAVENOUS

## 2015-11-24 MED ORDER — PANTOPRAZOLE SODIUM 20 MG PO TBEC
20 MG | ORAL_TABLET | Freq: Every day | ORAL | 0 refills | Status: DC
Start: 2015-11-24 — End: 2016-03-23

## 2015-11-24 MED ORDER — PROMETHAZINE HCL 25 MG PO TABS
25 MG | ORAL_TABLET | Freq: Four times a day (QID) | ORAL | 0 refills | Status: AC | PRN
Start: 2015-11-24 — End: 2015-12-01

## 2015-11-24 MED FILL — PROMETHAZINE HCL 25 MG/ML IJ SOLN: 25 mg/mL | INTRAMUSCULAR | Qty: 1

## 2015-11-24 NOTE — ED Provider Notes (Signed)
[facility]     Emergency Department     Faculty Attestation    I performed a history and physical examination of the patient and discussed management with the resident. I reviewed the resident???s note and agree with the documented findings and plan of care. Any areas of disagreement are noted on the chart. I was personally present for the key portions of any procedures. I have documented in the chart those procedures where I was not present during the key portions. I have reviewed the emergency nurses triage note. I agree with the chief complaint, past medical history, past surgical history, allergies, medications, social and family history as documented unless otherwise noted below.   For Physician Assistant/ Nurse Practitioner cases/documentation I have personally evaluated this patient and have completed at least one if not all key elements of the E/M (history, physical exam, and MDM). Additional findings are as noted.      Primary Care Physician:  Cherylann RatelFlorentina Chirica, MD    CHIEF COMPLAINT       Chief Complaint   Patient presents with   ??? Emesis     woke up this morning vomiting. pt noted blood in vomit       RECENT VITALS:    ,  Pulse: 88, Resp: 18, BP: (!) 145/96    LABS:  Labs Reviewed   BASIC METABOLIC PANEL - Abnormal; Notable for the following:        Result Value    Glucose 115 (*)     All other components within normal limits   HEPATIC FUNCTION PANEL - Abnormal; Notable for the following:     ALT 60 (*)     AST 44 (*)     Total Bilirubin 0.20 (*)     All other components within normal limits   CBC WITH AUTO DIFFERENTIAL   LIPASE   UA W/REFLEX CULTURE         PERTINENT ATTENDING PHYSICIAN COMMENTS:    Patient presents with With nausea vomiting hematemesis no history of melena or ulcer disease    Critical Care    None      Earl Litesichard D Scherry Laverne,  MD, [facility]  Attending Emergency  Physician             Earl Litesichard D Osmara Drummonds, MD  11/24/15 [phone]

## 2015-11-24 NOTE — Progress Notes (Signed)
Addressed in ED

## 2015-11-24 NOTE — ED Provider Notes (Signed)
[facility] ED  Emergency Department Encounter  Emergency Medicine Resident     Pt Name: David [name]  MRN: [id number]  Birthdate March 09, 1986  Date of evaluation: 11/24/15  PCP:  Cherylann Ratel, MD    CHIEF COMPLAINT       Chief Complaint   Patient presents with   ??? Emesis     woke up this morning vomiting. pt noted blood in vomit       HISTORY OF PRESENT ILLNESS  (Location/Symptom, Timing/Onset, Context/Setting, Quality, Duration, Modifying Factors, Severity.)      David [name] is a 30 y.o. male who presents with epigastric pain, vomiting, hematemesis.  Patient states he woke up this morning around 5 this a.m. complaining of epigastric pain and multiple episodes of vomiting.  Initially was nonbloody however after multiple episodes of vomiting it was bloody.  He denies any diarrhea or fevers.  He is feeling well yesterday.  He denies any alcohol or drug use.  He has not had these symptoms before.  He does feel a bit lightheaded otherwise no other symptoms.    PAST MEDICAL / SURGICAL / SOCIAL / FAMILY HISTORY      has a past medical history of Back pain and Headache.     has no past surgical history on file.    Social History     Social History   ??? Marital status: Single     Spouse name: N/A   ??? Number of children: N/A   ??? Years of education: N/A     Occupational History   ??? Not on file.     Social History Main Topics   ??? Smoking status: Former Smoker     Quit date: 09/16/2012   ??? Smokeless tobacco: Not on file   ??? Alcohol use 0.0 oz/week     0 Standard drinks or equivalent per week   ??? Drug use: Yes     Special: Other-see comments      Comment: xanax   ??? Sexual activity: Yes     Partners: Female     Other Topics Concern   ??? Not on file     Social History Narrative       Family History   Problem Relation Age of Onset   ??? Cancer Other 90     possible colon cancer       Allergies:  Codeine    Home Medications:  Prior to Admission medications    Medication Sig Start Date End Date Taking? Authorizing  Provider   promethazine (PHENERGAN) 25 MG tablet Take 1 tablet by mouth every 6 hours as needed for Nausea WARNING:  May cause drowsiness.  May impair ability to operate vehicles or machinery.  Do not use in combination with alcohol. 11/24/15 12/01/15 Yes Warrick Parisian, DO   pantoprazole (PROTONIX) 20 MG tablet Take 1 tablet by mouth daily 11/24/15  Yes Warrick Parisian, DO   methadone 10 MG/5ML solution Take 110 mg by mouth daily  .   Yes Historical Provider, MD   gabapentin (NEURONTIN) 400 MG capsule Take 1 capsule by mouth 3 times daily 07/31/15   Cherylann Ratel, MD   cyclobenzaprine (FLEXERIL) 10 MG tablet Take 1 tablet by mouth 3 times daily as needed for Muscle spasms (only as needed) Causes sedation, do not drive while taking this medication 08/29/14   Cherylann Ratel, MD   ibuprofen (ADVIL;MOTRIN) 800 MG tablet Take 1 tablet by mouth every 8 hours as needed  for Pain 08/29/14   Cherylann Ratel, MD   fexofenadine (ALLEGRA) 180 MG tablet Take 1 tablet by mouth daily 08/29/14   Cherylann Ratel, MD   fluticasone (FLONASE) 50 MCG/ACT nasal spray 2 sprays by Nasal route daily 08/29/14   Cherylann Ratel, MD   hydrOXYzine (ATARAX) 25 MG tablet Take 1 tablet by mouth every 12 hours as needed for Anxiety 08/29/14   Cherylann Ratel, MD       REVIEW OF SYSTEMS    (2-9 systems for level 4, 10 or more for level 5)      Review of Systems   Constitutional: Negative for chills, fatigue and fever.   HENT: Negative for congestion and sore throat.    Eyes: Negative for visual disturbance.   Respiratory: Negative for cough and shortness of breath.    Cardiovascular: Negative for chest pain and palpitations.   Gastrointestinal: Positive for abdominal pain, nausea and vomiting. Negative for blood in stool, constipation and diarrhea.   Musculoskeletal: Negative for back pain and neck pain.   Skin: Negative for rash.   Neurological: Positive for light-headedness. Negative for weakness, numbness and headaches.       PHYSICAL EXAM    (up to 7 for level 4, 8 or more for level 5)      INITIAL VITALS:   BP (!) 145/96   Pulse 88   Resp 18   SpO2 98%    Physical Exam   Constitutional: He is oriented to person, place, and time. He appears well-developed and well-nourished.   HENT:   Head: Normocephalic and atraumatic.   Eyes: EOM are normal. Pupils are equal, round, and reactive to light.   Neck: Normal range of motion. Neck supple.   Cardiovascular: Normal rate, regular rhythm, normal heart sounds and intact distal pulses.  Exam reveals no gallop and no friction rub.    No murmur heard.  Pulmonary/Chest: Effort normal and breath sounds normal. No respiratory distress. He has no wheezes. He has no rales.   Abdominal: Soft. Bowel sounds are normal. He exhibits no distension. There is tenderness. There is no rebound and no guarding.   Mild epigastric tenderness, no rebound or guarding, no peritoneal signs   Musculoskeletal: Normal range of motion. He exhibits no edema or tenderness.   Neurological: He is alert and oriented to person, place, and time.   Skin: Skin is warm and dry. No rash noted. No erythema.   Nursing note and vitals reviewed.      DIFFERENTIAL  DIAGNOSIS     PLAN (LABS / IMAGING / EKG):  Orders Placed This Encounter   Procedures   ??? XR Acute Abd Series Chest 1 VW   ??? CBC WITH AUTO DIFFERENTIAL   ??? BASIC METABOLIC PANEL   ??? HEPATIC FUNCTION PANEL   ??? LIPASE   ??? UA W/REFLEX CULTURE       MEDICATIONS ORDERED:  Orders Placed This Encounter   Medications   ??? 0.9 % sodium chloride bolus   ??? promethazine (PHENERGAN) injection 12.5 mg   ??? promethazine (PHENERGAN) 25 MG tablet     Sig: Take 1 tablet by mouth every 6 hours as needed for Nausea WARNING:  May cause drowsiness.  May impair ability to operate vehicles or machinery.  Do not use in combination with alcohol.     Dispense:  20 tablet     Refill:  0   ??? pantoprazole (PROTONIX) 20 MG tablet     Sig: Take 1 tablet by mouth  daily     Dispense:  30 tablet     Refill:  0       DDX:  Epigastric pain, hematemesis, lightheadedness, no fevers or other symptoms.  Mild epigastric pain on exam.  We'll check labs, IV fluids, antiemetic, acute abdominal series, reassess.    DIAGNOSTIC RESULTS / EMERGENCY DEPARTMENT COURSE / MDM     LABS:  Results for orders placed or performed during the hospital encounter of 11/24/15   CBC WITH AUTO DIFFERENTIAL   Result Value Ref Range    WBC 9.4 3.5 - 11.0 k/uL    RBC 5.39 4.5 - 5.9 m/uL    Hemoglobin 15.4 13.5 - 17.5 g/dL    Hematocrit 56.246.1 41 - 53 %    MCV 85.6 80 - 100 fL    MCH 28.5 26 - 34 pg    MCHC 33.4 31 - 37 g/dL    RDW 13.013.7 86.512.5 - 78.415.4 %    Platelets 250 140 - 450 k/uL    MPV 8.1 6.0 - 12.0 fL    Differential Type NOT REPORTED     Seg Neutrophils 63 %    Lymphocytes 27 %    Monocytes 8 %    Eosinophils % 2 %    Basophils 0 %    Segs Absolute 6.00 1.8 - 7.7 k/uL    Absolute Lymph # 2.50 1.0 - 4.8 k/uL    Absolute Mono # 0.70 0.1 - 1.2 k/uL    Absolute Eos # 0.20 0.0 - 0.4 k/uL    Basophils # 0.00 0.0 - 0.2 k/uL    WBC Morphology       Normal ranges are not listed for percent cell differential counts because there    RBC Morphology NOT REPORTED     Platelet Estimate NOT REPORTED    BASIC METABOLIC PANEL   Result Value Ref Range    Glucose 115 (H) 70 - 99 mg/dL    BUN 12 6 - 20 mg/dL    CREATININE 6.960.75 2.950.70 - 1.20 mg/dL    Bun/Cre Ratio NOT REPORTED 9 - 20    Calcium 9.3 8.6 - 10.4 mg/dL    Sodium 284138 132135 - 440144 mmol/L    Potassium 4.8 3.7 - 5.3 mmol/L    Chloride 102 98 - 107 mmol/L    CO2 23 20 - 31 mmol/L    Anion Gap 13 9 - 17 mmol/L    GFR Non-African American >60 >60 mL/min    GFR African American >60 >60 mL/min    GFR Comment          GFR Staging NOT REPORTED    HEPATIC FUNCTION PANEL   Result Value Ref Range    Alb 4.2 3.5 - 5.2 g/dL    Alkaline Phosphatase 93 40 - 129 U/L    ALT 60 (H) 5 - 41 U/L    AST 44 (H) <40 U/L    Total Bilirubin 0.20 (L) 0.3 - 1.2 mg/dL    Bilirubin, Direct 1.020.08 <0.31 mg/dL    Bilirubin, Indirect 0.12 0.00 - 1.00 mg/dL     Total Protein 8.0 6.4 - 8.3 g/dL    Globulin NOT REPORTED 1.5 - 3.8 g/dL    Albumin/Globulin Ratio 1.1 1.0 - 2.5   LIPASE   Result Value Ref Range    Lipase 27 13 - 60 U/L     RADIOLOGY:  Xr Acute Abd Series Chest 1 Vw    Result Date: 11/24/2015  EXAMINATION:  ACUTE ABDOMINAL SERIES 11/24/2015 8:32 am COMPARISON: 09/09/2015 HISTORY: ORDERING SYSTEM PROVIDED HISTORY: hematemesis, epigastric pain Left upper quadrant pain and vomiting for 4 hours FINDINGS: Lungs are clear without acute process. No pleural effusion or pneumothorax. No focal consolidation or edema. Cardiomediastinal silhouette and bony thorax are unremarkable. No evidence of intraperitoneal free air. Nonspecific bowel gas pattern without evidence of obstruction. No abnormal calcifications. Osseous structures are intact.     Negative chest.  Nonobstructive bowel gas pattern.       EMERGENCY DEPARTMENT COURSE:  9:46 AM  Labwork unremarkable, symptomatically improved, acute abdominal series negative.  We'll discharge with Phenergan, follow-up PCP or return here for any worsening or changing symptoms.  Patient aware Phenergan will make him drowsy.  We'll also send home on Protonix.  Patient to follow up with PCP, return here for any worsening or changing symptoms as noted previously.    PROCEDURES:  None    CONSULTS:  None    CRITICAL CARE:  None    FINAL IMPRESSION      1. Hematemesis with nausea    2. Abdominal pain, epigastric          DISPOSITION / PLAN     DISPOSITION Decision to Discharge    PATIENT REFERRED TO:  Cherylann Ratel, MD  [address], [address]  [place] [address]  [phone]    In 2 days      [facility]  [address]  [place] [address]  [phone]    As needed, If symptoms worsen      DISCHARGE MEDICATIONS:  New Prescriptions    PANTOPRAZOLE (PROTONIX) 20 MG TABLET    Take 1 tablet by mouth daily    PROMETHAZINE (PHENERGAN) 25 MG TABLET    Take 1 tablet by mouth every 6 hours as needed for Nausea WARNING:   May cause drowsiness.  May impair ability to operate vehicles or machinery.  Do not use in combination with alcohol.       Warrick Parisian, DO  Emergency Medicine Resident    (Please note that portions of this note were completed with a voice recognition program.  Efforts were made to edit the dictations but occasionally words are mis-transcribed.)       Warrick Parisian, DO  Resident  11/24/15 0947       Warrick Parisian, DO  Resident  11/24/15 [phone]

## 2015-11-24 NOTE — ED Notes (Signed)
Bed: 18  Expected date: 11/24/15  Expected time: 7:03 AM  Means of arrival: Ambulance  Comments:     Erskin Burnetnthony J Sopko, RN  11/24/15 [phone]

## 2015-11-24 NOTE — ED Notes (Signed)
Pt presents to ED with onset of NV this morning. Pt states pain woke him up. Pt states Hxof this before. Pt states he doesn't feel this is withdrawal pain. Pt noted to have blood in vomit. On arrival monitor placed IV placed. Pt medicated. Blood sent off. Pt resting in bed with NV controlled.      Fonnie MuNicholas A Dedria Endres, RN  11/24/15 [phone]

## 2015-11-24 NOTE — Progress Notes (Signed)
Addressed in ED. no acute disease

## 2015-12-15 ENCOUNTER — Emergency Department: Admit: 2015-12-15 | Payer: PRIVATE HEALTH INSURANCE | Primary: Family

## 2015-12-15 ENCOUNTER — Inpatient Hospital Stay
Admit: 2015-12-15 | Discharge: 2015-12-15 | Disposition: A | Discharge status: Home / Self Care | Payer: PRIVATE HEALTH INSURANCE | Attending: Emergency Medicine

## 2015-12-15 DIAGNOSIS — K920 Hematemesis: Secondary | ICD-10-CM

## 2015-12-15 LAB — POC PANEL (EC8)-VEN
Anion Gap: 19 mmol/L — ABNORMAL HIGH (ref 8–16)
HCO3, Venous: 22.2 mmol/L — ABNORMAL LOW (ref 24–30)
Negative Base Excess, Ven: 1 (ref 0.0–2.0)
POC BUN: 13 mg/dL (ref 6–20)
POC Chloride: 104 mmol/L (ref 98–110)
POC Glucose: 127 mg/dL — ABNORMAL HIGH (ref 74–106)
POC Hematocrit: 47 % (ref 41–53)
POC Hemoglobin: 16 g/dL (ref 13.5–17.5)
POC Potassium: 4 mmol/L (ref 3.5–5.1)
POC Sodium: 141 mmol/L (ref 136–145)
Total CO2, Venous: 23 mm Hg — ABNORMAL LOW (ref 25–31)
pCO2, Ven: 31 mm Hg — ABNORMAL LOW (ref 39–55)
pH, Ven: 7.47 — ABNORMAL HIGH (ref 7.32–7.42)

## 2015-12-15 MED ORDER — PANTOPRAZOLE SODIUM 40 MG PO TBEC
40 MG | ORAL_TABLET | Freq: Every day | ORAL | 0 refills | Status: DC
Start: 2015-12-15 — End: 2017-02-21

## 2015-12-15 MED ORDER — SODIUM CHLORIDE 0.9 % IV BOLUS
0.9 % | Freq: Once | INTRAVENOUS | Status: AC
Start: 2015-12-15 — End: 2015-12-15
  Administered 2015-12-15: 10:00:00 500 mL via INTRAVENOUS

## 2015-12-15 MED ORDER — PROMETHAZINE HCL 25 MG/ML IJ SOLN
25 MG/ML | INTRAMUSCULAR | Status: AC
Start: 2015-12-15 — End: 2015-12-15
  Administered 2015-12-15: 10:00:00 25 via INTRAMUSCULAR

## 2015-12-15 MED ORDER — HYDROXYZINE HCL 50 MG/ML IM SOLN
50 MG/ML | Freq: Once | INTRAMUSCULAR | Status: AC
Start: 2015-12-15 — End: 2015-12-15
  Administered 2015-12-15: 11:00:00 50 mg via INTRAMUSCULAR

## 2015-12-15 MED ORDER — GABAPENTIN 400 MG PO CAPS
400 MG | ORAL_CAPSULE | Freq: Three times a day (TID) | ORAL | 0 refills | Status: DC
Start: 2015-12-15 — End: 2017-08-11

## 2015-12-15 MED ORDER — ONDANSETRON HCL 4 MG/2ML IJ SOLN
4 MG/2ML | Freq: Once | INTRAMUSCULAR | Status: AC
Start: 2015-12-15 — End: 2015-12-15
  Administered 2015-12-15: 11:00:00 4 mg via INTRAVENOUS

## 2015-12-15 MED ORDER — PROMETHAZINE HCL 25 MG PO TABS
25 MG | ORAL_TABLET | Freq: Four times a day (QID) | ORAL | 0 refills | Status: AC | PRN
Start: 2015-12-15 — End: 2015-12-22

## 2015-12-15 MED FILL — PROMETHAZINE HCL 25 MG/ML IJ SOLN: 25 mg/mL | INTRAMUSCULAR | Qty: 1

## 2015-12-15 MED FILL — HYDROXYZINE HCL 50 MG/ML IM SOLN: 50 mg/mL | INTRAMUSCULAR | Qty: 1

## 2015-12-15 MED FILL — ONDANSETRON HCL 4 MG/2ML IJ SOLN: 4 MG/2ML | INTRAMUSCULAR | Qty: 2

## 2015-12-15 NOTE — ED Provider Notes (Signed)
[facility]     Emergency Department     Faculty Attestation    I performed a history and physical examination of the patient and discussed management with the resident. I reviewed the resident's note and agree with the documented findings and plan of care. Any areas of disagreement are noted on the chart. I was personally present for the key portions of any procedures. I have documented in the chart those procedures where I was not present during the key portions. I have reviewed the emergency nurses triage note. I agree with the chief complaint, past medical history, past surgical history, allergies, medications, social and family history as documented unless otherwise noted below. Documentation of the HPI, Physical Exam and Medical Decision Making performed by medical students or scribes is based on my personal performance of the HPI, PE and MDM. For Physician Assistant/ Nurse Practitioner cases/documentation I have personally evaluated this patient and have completed at least one if not all key elements of the E/M (history, physical exam, and MDM). Additional findings are as noted.        Ran Tullis MD  Attending Emergency  Physician             Jermiyah Ricotta L Nataliya Graig, MD  12/15/15 0657

## 2015-12-15 NOTE — Progress Notes (Signed)
SW met with patient who states he does not wish to follow-up with his current PCP and is agreeable to SW setting up a new appointment.  SW set up appointment with [facility] on Friday, January 02, 2016 at [facility] and this information was provided to him in writing.  David Glad L. Donnie Aho, [place]

## 2015-12-15 NOTE — ED Notes (Signed)
Pt came into the ER today with emesis that started this morning and woke him up out of his sleep. Pt excessively coughing and retching trying to vomit but nothing is coming up at this time noted by nurse. Pt was recently seen about 2 weeks ago for the same thing and had been throwing up blood. Pt was also recently admitted about a month ago with dx of pneumonia and states he had the same symptoms when he had pneumonia. Pt denies any abdominal pain at this time but states his stomach is burning from the constant dry heaving. No other complaints at this time. Dr. Marlane MingleMohan at bedside.      Dewaine Congeraylor Scott Vanderveer, RN  12/15/15 0630

## 2015-12-15 NOTE — ED Notes (Signed)
Report given to Marylene LandAngela, Charity fundraiserN.      Dewaine Congeraylor Darryl Blumenstein, RN  12/15/15 [phone]

## 2015-12-15 NOTE — ED Provider Notes (Signed)
[facility]  eMERGENCY dEPARTMENT eNCOUnter  Resident    Pt Name: David DukesDerek D Bonine  MRN: [id number]  Birthdate 04/11/1986  Date of evaluation: 12/15/2015  PCP:  Cherylann RatelFlorentina Chirica, MD    CHIEF COMPLAINT       Chief Complaint   Patient presents with   ??? Emesis         HISTORY OF PRESENT ILLNESS    David [name] is a 30 y.o. male who presents hematemesis.  Patient has been having hematemesis 2 hours prior to arrival here in the ER.  Complaining of some epigastric abdominal pain status post hematemesis.  Has been having this issue in the past back in Nov 24, 2015.  Was discharged home with Phenergan and Protonix and recommendations to have an EGD done.  Patient has not had any follow-up with gastroenterology.  Denying any cough or congestion.  Denying any diarrhea, constipation, blood in the stools.  Denying any pain with urination or blood in the urine.         REVIEW OF SYSTEMS       Review of Systems   Constitutional: Negative for diaphoresis.   HENT: Negative for congestion and dental problem.    Eyes: Negative for pain and redness.   Respiratory: Negative for cough, chest tightness and wheezing.    Cardiovascular: Negative for chest pain and leg swelling.   Gastrointestinal: Positive for nausea and vomiting.   Genitourinary: Negative for dysuria and flank pain.       PAST MEDICAL HISTORY/SURGICAL HISTORY/FAMILY HISTORY      has a past medical history of Back pain and Headache.  Past medical hx was reviewed and confirmed by myself and the patient.   has no past surgical history on file.  Past surgical hx was reviewed and confirmed by myself and the patient.  indicated that the status of his other is unknown.    family history includes Cancer (age of onset: 9390) in an other family member.  Past family hx was reviewed with patient and confirmed by myself and the patient    CURRENT MEDICATIONS       Discharge Medication List as of 12/15/2015  8:40 AM      CONTINUE these medications which have NOT CHANGED     Details   ibuprofen (ADVIL;MOTRIN) 800 MG tablet Take 1 tablet by mouth every 8 hours as needed for Pain, Disp-90 tablet, R-1      hydrOXYzine (ATARAX) 25 MG tablet Take 1 tablet by mouth every 12 hours as needed for Anxiety, Disp-60 tablet, R-0      methadone 10 MG/5ML solution Take 110 mg by mouth daily  .      !! pantoprazole (PROTONIX) 20 MG tablet Take 1 tablet by mouth daily, Disp-30 tablet, R-0Print      cyclobenzaprine (FLEXERIL) 10 MG tablet Take 1 tablet by mouth 3 times daily as needed for Muscle spasms (only as needed) Causes sedation, do not drive while taking this medication, Disp-90 tablet, R-2      fexofenadine (ALLEGRA) 180 MG tablet Take 1 tablet by mouth daily, Disp-30 tablet, R-1      fluticasone (FLONASE) 50 MCG/ACT nasal spray 2 sprays by Nasal route daily, Disp-1 Bottle, R-3       !! - Potential duplicate medications found. Please discuss with provider.          ALLERGIES     is allergic to codeine.      SOCIAL HISTORY  reports that he quit smoking about 3 years ago. He does not have any smokeless tobacco history on file. He reports that he drinks alcohol. He reports that he uses illicit drugs, including Other-see comments, IV, and Opiates .    PHYSICAL EXAM     INITIAL VITALS:  oral temperature is 97 ??F (36.1 ??C). His blood pressure is 137/92 (abnormal) and his pulse is 89. His respiration is 20 and oxygen saturation is 100%.      Physical Exam   Constitutional: He is oriented to person, place, and time. He appears distressed.   HENT:   Head: Normocephalic.   Neck: Normal range of motion.   Cardiovascular: Normal rate and regular rhythm.    Pulmonary/Chest: Breath sounds normal. No respiratory distress. He has no wheezes.   Abdominal: Soft.   Notable tenderness to palpation over the epigastric region.  Normoactive bowel sounds in all 4 quadrants.  Non-peritoneal abdomen.   Musculoskeletal: Normal range of motion.   Neurological: He is oriented to person, place, and time.            DIAGNOSTIC RESULTS     EKG: All EKG's are interpreted by the Emergency Department Physician who either signs or Co-signs this chart in the absence of a cardiologist.        RADIOLOGY:   I directly visualized the following  images and reviewed the radiologist interpretations:  XR Acute Abd Series Chest 1 VW   Final Result   No acute findings                 ED BEDSIDE ULTRASOUND:       LABS:  Labs Reviewed   POC PANEL (EC8)-VEN - Abnormal; Notable for the following:        Result Value    pH, Ven 7.47 (*)     pCO2, Ven 31 (*)     POC Glucose 127 (*)     Total CO2, Venous 23 (*)     HCO3, Venous 22.2 (*)     Anion Gap 19 (*)     All other components within normal limits   POC CG8: NA, K, ICA, GLU, HCT/HGB(CALC), BLOOD GASES             EMERGENCY DEPARTMENT COURSE/DDx/MDM   Hematemesis/peptic ulcer disease/dyspepsia/hemoptysis/pneumonia    We'll obtain an abdominal series with chest.  Treat the patient with Phenergan IM 25 mg.  Obtain IV access and obtain an i-STAT chem 8.  If unremarkable serological workup and imaging studies, most likely discharged home with Phenergan oral tablets.    0700: Patient was reassessed at this time.  Patient in no acute distress.  No evidence of any emesis in the ER.  Patient was also given a liter of IV fluids.  Patient appears to be clinically stable in no acute distress.  At this time we recommended that he follow-up with a gastroenterologist and obtain a PCP.  Spoke with Clydie Braun our social worker who is obtained a PCP appointment for the patient.  Patient is amenable to receiving Phenergan, Protonix,  gabapentin on discharge.    Vitals:    Vitals:    12/15/15 0609 12/15/15 0611   BP:  (!) 137/92   Pulse: 89    Resp: 20    Temp: 97 ??F (36.1 ??C)    TempSrc: Oral    SpO2: 100%          CRITICAL CARE:      CONSULTS:  None  PROCEDURES:      FINAL IMPRESSION      1. Hematemesis with nausea    2. Chronic bilateral low back pain without sciatica            DISPOSITION/PLAN    DISPOSITION Decision To Discharge        PATIENT REFERRED TO:  Jaci Standard, MD  [address]  [place] [address]  [phone]    Schedule an appointment as soon as possible for a visit  If symptoms worsen      DISCHARGE MEDICATIONS:  Discharge Medication List as of 12/15/2015  8:40 AM      START taking these medications    Details   promethazine (PHENERGAN) 25 MG tablet Take 1 tablet by mouth every 6 hours as needed for Nausea WARNING:  May cause drowsiness.  May impair ability to operate vehicles or machinery.  Do not use in combination with alcohol., Disp-20 tablet, R-0Print      !! pantoprazole (PROTONIX) 40 MG tablet Take 1 tablet by mouth daily, Disp-30 tablet, R-0Print       !! - Potential duplicate medications found. Please discuss with provider.          (Please note that portions of this note were completed with a voice recognition program.  Efforts were made to edit the dictations but occasionally words are mis-transcribed.)    Reiley Keisler X [facility]  Emergency Medicine Resident             Wendall Mola, DO  Resident  12/15/15 [phone]

## 2015-12-15 NOTE — Progress Notes (Signed)
Addressed in ED

## 2015-12-15 NOTE — Progress Notes (Signed)
Addressed in ED. no acute disease on CXR

## 2015-12-15 NOTE — ED Notes (Signed)
Pt resting on cart with lights off, no distress noted. Family at bedside, awaiting further orders, will continue to monitor     Letitia NeriAngela R Buena Boehm, RN  12/15/15 [phone]

## 2015-12-29 ENCOUNTER — Emergency Department: Admit: 2015-12-29 | Payer: PRIVATE HEALTH INSURANCE | Primary: Family

## 2015-12-29 ENCOUNTER — Inpatient Hospital Stay
Admit: 2015-12-29 | Discharge: 2015-12-29 | Disposition: A | Discharge status: Home / Self Care | Payer: PRIVATE HEALTH INSURANCE | Attending: Emergency Medicine

## 2015-12-29 DIAGNOSIS — K920 Hematemesis: Secondary | ICD-10-CM

## 2015-12-29 LAB — URINALYSIS WITH MICROSCOPIC
Bilirubin Urine: NEGATIVE
Casts UA: 0 /LPF (ref 0–8)
Glucose, Ur: NEGATIVE
Ketones, Urine: NEGATIVE
Leukocyte Esterase, Urine: NEGATIVE
Nitrite, Urine: NEGATIVE
Protein, UA: NEGATIVE
RBC, UA: 0 /HPF (ref 0–4)
Specific Gravity, UA: 1.018 (ref 1.005–1.030)
Urine Hgb: NEGATIVE
Urobilinogen, Urine: NORMAL
pH, UA: 7.5 (ref 5.0–8.0)

## 2015-12-29 LAB — COMPREHENSIVE METABOLIC PANEL
ALT: 41 U/L (ref 5–41)
AST: 27 U/L (ref <40)
Albumin/Globulin Ratio: 1.3 (ref 1.0–2.5)
Albumin: 4.3 g/dL (ref 3.5–5.2)
Alkaline Phosphatase: 91 U/L (ref 40–129)
Anion Gap: 12 mmol/L (ref 9–17)
BUN: 16 mg/dL (ref 6–20)
CO2: 26 mmol/L (ref 20–31)
Calcium: 9.5 mg/dL (ref 8.6–10.4)
Chloride: 100 mmol/L (ref 98–107)
Creatinine: 0.86 mg/dL (ref 0.70–1.20)
GFR African American: >60 mL/min (ref >60)
GFR Non-African American: >60 mL/min (ref >60)
Glucose: 102 mg/dL — ABNORMAL HIGH (ref 70–99)
Potassium: 4.4 mmol/L (ref 3.7–5.3)
Sodium: 138 mmol/L (ref 135–144)
Total Bilirubin: 0.18 mg/dL — ABNORMAL LOW (ref 0.3–1.2)
Total Protein: 7.7 g/dL (ref 6.4–8.3)

## 2015-12-29 LAB — CBC WITH AUTO DIFFERENTIAL
Absolute Eos #: 0.2 10*3/uL (ref 0.0–0.4)
Absolute Lymph #: 2.4 10*3/uL (ref 1.0–4.8)
Absolute Mono #: 0.6 10*3/uL (ref 0.1–1.2)
Basophils Absolute: 0 10*3/uL (ref 0.0–0.2)
Basophils: 0 %
Eosinophils %: 3 %
Hematocrit: 41.8 % (ref 41–53)
Hemoglobin: 14.1 g/dL (ref 13.5–17.5)
Lymphocytes: 31 %
MCH: 29 pg (ref 26–34)
MCHC: 33.7 g/dL (ref 31–37)
MCV: 85.9 fL (ref 80–100)
MPV: 8.3 fL (ref 6.0–12.0)
Monocytes: 7 %
Platelets: 228 10*3/uL (ref 140–450)
RBC: 4.86 m/uL (ref 4.5–5.9)
RDW: 14 % (ref 12.5–15.4)
Seg Neutrophils: 59 %
Segs Absolute: 4.6 10*3/uL (ref 1.8–7.7)
WBC: 7.7 10*3/uL (ref 3.5–11.0)

## 2015-12-29 LAB — LIPASE: Lipase: 26 U/L (ref 13–60)

## 2015-12-29 MED ORDER — ONDANSETRON 4 MG PO TBDP
4 MG | ORAL_TABLET | Freq: Three times a day (TID) | ORAL | 0 refills | Status: DC | PRN
Start: 2015-12-29 — End: 2015-12-29

## 2015-12-29 MED ORDER — SODIUM CHLORIDE 0.9 % IJ SOLN
0.9 % | Freq: Once | INTRAMUSCULAR | Status: AC
Start: 2015-12-29 — End: 2015-12-29
  Administered 2015-12-29: 09:00:00 10 mL via INTRAVENOUS

## 2015-12-29 MED ORDER — SODIUM CHLORIDE 0.9 % IV BOLUS
0.9 % | Freq: Once | INTRAVENOUS | Status: AC
Start: 2015-12-29 — End: 2015-12-29
  Administered 2015-12-29: 10:00:00 1000 mL via INTRAVENOUS

## 2015-12-29 MED ORDER — PROMETHAZINE HCL 25 MG PO TABS
25 MG | ORAL_TABLET | Freq: Four times a day (QID) | ORAL | 0 refills | Status: AC | PRN
Start: 2015-12-29 — End: 2016-01-05

## 2015-12-29 MED ORDER — IOVERSOL 74 % IV SOLN
74 % | Freq: Once | INTRAVENOUS | Status: AC | PRN
Start: 2015-12-29 — End: 2015-12-29
  Administered 2015-12-29: 11:00:00 130 mL via INTRAVENOUS

## 2015-12-29 MED ORDER — FAMOTIDINE 20 MG PO TABS
20 MG | ORAL_TABLET | Freq: Two times a day (BID) | ORAL | 0 refills | Status: DC
Start: 2015-12-29 — End: 2017-02-21

## 2015-12-29 MED ORDER — PROMETHAZINE HCL 25 MG/ML IJ SOLN
25 MG/ML | Freq: Once | INTRAMUSCULAR | Status: AC
Start: 2015-12-29 — End: 2015-12-29
  Administered 2015-12-29: 10:00:00 12.5 mg via INTRAVENOUS

## 2015-12-29 MED ORDER — ALUM & MAG HYDROXIDE-SIMETH 200-200-20 MG/5ML PO SUSP
200-200-20 MG/5ML | Freq: Once | ORAL | Status: AC
Start: 2015-12-29 — End: 2015-12-29
  Administered 2015-12-29: 09:00:00 30 mL via ORAL

## 2015-12-29 MED ORDER — ONDANSETRON HCL 4 MG/2ML IJ SOLN
4 MG/2ML | Freq: Once | INTRAMUSCULAR | Status: AC
Start: 2015-12-29 — End: 2015-12-29
  Administered 2015-12-29: 09:00:00 4 mg via INTRAVENOUS

## 2015-12-29 MED ORDER — ACETAMINOPHEN 500 MG PO TABS
500 MG | Freq: Once | ORAL | Status: AC
Start: 2015-12-29 — End: 2015-12-29
  Administered 2015-12-29: 09:00:00 1000 mg via ORAL

## 2015-12-29 MED ORDER — PANTOPRAZOLE SODIUM 40 MG IV SOLR
40 MG | Freq: Once | INTRAVENOUS | Status: AC
Start: 2015-12-29 — End: 2015-12-29
  Administered 2015-12-29: 09:00:00 40 mg via INTRAVENOUS

## 2015-12-29 MED FILL — ONDANSETRON HCL 4 MG/2ML IJ SOLN: 4 MG/2ML | INTRAMUSCULAR | Qty: 2

## 2015-12-29 MED FILL — MAG-AL PLUS 200-200-20 MG/5ML PO LIQD: 200-200-20 MG/5ML | ORAL | Qty: 30

## 2015-12-29 MED FILL — MAPAP 500 MG PO TABS: 500 MG | ORAL | Qty: 2

## 2015-12-29 MED FILL — PROTONIX 40 MG IV SOLR: 40 MG | INTRAVENOUS | Qty: 40

## 2015-12-29 MED FILL — PROMETHAZINE HCL 25 MG/ML IJ SOLN: 25 MG/ML | INTRAMUSCULAR | Qty: 1

## 2015-12-29 NOTE — ED Provider Notes (Signed)
[facility] ED  Emergency Department Encounter  Emergency Medicine Resident     Pt Name: David [name]  MRN: [id number]  Birthdate 01/11/1986  Date of evaluation: 12/29/15  PCP:  Cherylann Ratel, MD    CHIEF COMPLAINT       Chief Complaint   Patient presents with   ??? Nausea   ??? Hematemesis   ??? Abdominal Pain       HISTORY OF PRESENT ILLNESS  (Location/Symptom, Timing/Onset, Context/Setting, Quality, Duration, Modifying Factors, Severity.)      David [name] is a 29 y.o. male who presents with complaints of persistent nausea and vomiting starting earlier today.  Patient has had a few episodes of this over the past couple months.  Patient states he takes Phenergan at home which helps with his nausea but was unable to take it today due to the bad nausea.  Also complaining of increased pressure in his left upper quadrant which is new.  Patient states worse with movements as well as with food.  Patient states she is supposed to be on Zantac but has not been taking it.  Has appointment tomorrow with primary care to get set up for an EGD.  No history of GI illnesses in his family.  Patient denies any fever or chills, chest pain, shortness of breath, headache, numbness, tingling, weakness, dysuria, hematuria, diarrhea, constipation or bloody stools, dark stools.    REVIEW OF SYSTEMS    (2-9 systems for level 4, 10 or more for level 5)      Review of Systems   Constitutional: Negative for chills and fever.   HENT: Negative for congestion and rhinorrhea.    Respiratory: Negative for cough and shortness of breath.    Cardiovascular: Negative for chest pain and leg swelling.   Gastrointestinal: Positive for abdominal pain, nausea and vomiting. Negative for blood in stool, constipation and diarrhea.   Genitourinary: Negative for difficulty urinating and dysuria.   Musculoskeletal: Negative for back pain.   Skin: Negative for rash and wound.   Neurological: Negative for light-headedness and headaches.    Hematological: Negative for adenopathy. Does not bruise/bleed easily.   Psychiatric/Behavioral: Negative for behavioral problems and confusion.         PAST MEDICAL / SURGICAL / SOCIAL / FAMILY HISTORY      has a past medical history of Back pain and Headache.       has no past surgical history on file.      Social History     Social History   ??? Marital status: Single     Spouse name: N/A   ??? Number of children: N/A   ??? Years of education: N/A     Occupational History   ??? Not on file.     Social History Main Topics   ??? Smoking status: Former Smoker     Quit date: 09/16/2012   ??? Smokeless tobacco: Not on file   ??? Alcohol use 0.0 oz/week     0 Standard drinks or equivalent per week   ??? Drug use: Yes     Special: Other-see comments, IV, Opiates       Comment: xanax   ??? Sexual activity: Yes     Partners: Female     Other Topics Concern   ??? Not on file     Social History Narrative         Family History   Problem Relation Age of Onset   ??? Cancer Other  90     possible colon cancer       Portions of the past medical history, past surgical history, social history, and family history were discussed and reviewed with the patient/family and is included in HPI if pertinent.    ALLERGIES / IMMUNIZATIONS / HOME MEDICATIONS       Allergies:  Codeine and Nubain [nalbuphine hcl]      IMMUNIZATIONS      There is no immunization history on file for this patient.      Home Medications:  Prior to Admission medications    Medication Sig Start Date End Date Taking? Authorizing Provider   famotidine (PEPCID) 20 MG tablet Take 1 tablet by mouth 2 times daily 12/29/15  Yes Anette Guarneri, DO   promethazine (PHENERGAN) 25 MG tablet Take 1 tablet by mouth every 6 hours as needed for Nausea WARNING:  May cause drowsiness.  May impair ability to operate vehicles or machinery.  Do not use in combination with alcohol. 12/29/15 01/05/16 Yes Anette Guarneri, DO   pantoprazole (PROTONIX) 40 MG tablet Take 1 tablet by mouth daily 12/15/15   Wendall Mola, DO   gabapentin (NEURONTIN) 400 MG capsule Take 1 capsule by mouth 3 times daily 12/15/15   Wendall Mola, DO   pantoprazole (PROTONIX) 20 MG tablet Take 1 tablet by mouth daily 11/24/15   Warrick Parisian, DO   cyclobenzaprine (FLEXERIL) 10 MG tablet Take 1 tablet by mouth 3 times daily as needed for Muscle spasms (only as needed) Causes sedation, do not drive while taking this medication 08/29/14   Cherylann Ratel, MD   ibuprofen (ADVIL;MOTRIN) 800 MG tablet Take 1 tablet by mouth every 8 hours as needed for Pain 08/29/14   Cherylann Ratel, MD   fexofenadine (ALLEGRA) 180 MG tablet Take 1 tablet by mouth daily 08/29/14   Cherylann Ratel, MD   fluticasone (FLONASE) 50 MCG/ACT nasal spray 2 sprays by Nasal route daily 08/29/14   Cherylann Ratel, MD   hydrOXYzine (ATARAX) 25 MG tablet Take 1 tablet by mouth every 12 hours as needed for Anxiety 08/29/14   Cherylann Ratel, MD   methadone 10 MG/5ML solution Take 110 mg by mouth daily  .    Historical Provider, MD         PHYSICAL EXAM   (up to 7 for level 4, 8 or more for level 5)      INITIAL VITALS:    height is  (1.727 m) and weight is 240 lb (108.9 kg). His oral temperature is 98.1 ??F (36.7 ??C). His blood pressure is 115/73 and his pulse is 81. His respiration is 16 and oxygen saturation is 94%.     Physical Exam   Constitutional: He is oriented to person, place, and time. He appears well-developed and well-nourished. No distress.   HENT:   Head: Normocephalic and atraumatic.   Eyes: Conjunctivae and EOM are normal.   Neck: Normal range of motion. Neck supple.   Cardiovascular: Normal rate, regular rhythm, normal heart sounds and intact distal pulses.    Pulmonary/Chest: Effort normal and breath sounds normal. No respiratory distress.   Abdominal: Soft. Bowel sounds are normal. He exhibits no distension. There is tenderness (left upper quadrant). There is guarding. There is no rebound.   Musculoskeletal: Normal range of motion. He exhibits no  tenderness.   Neurological: He is alert and oriented to person, place, and time.   Skin: Skin is warm and dry. No rash  noted.   Psychiatric: He has a normal mood and affect.   Nursing note and vitals reviewed.        Vitals:    Vitals:    12/29/15 0413 12/29/15 0526   BP: 137/80 115/73   Pulse: 95 81   Resp: 16 16   Temp: 98.1 ??F (36.7 ??C)    TempSrc: Oral    SpO2: 94% 94%   Weight: 240 lb (108.9 kg)    Height: 5\' 8"  (1.727 m)        DIFFERENTIAL  DIAGNOSIS     PLAN (LABS / IMAGING / EKG):  Orders Placed This Encounter   Procedures   ??? CT ABDOMEN PELVIS W IV CONTRAST Additional Contrast? None   ??? CBC WITH AUTO DIFFERENTIAL   ??? Comprehensive Metabolic Panel   ??? LIPASE   ??? URINALYSIS WITH MICROSCOPIC   ??? Insert peripheral IV       Plan of care is reviewed and discussed with the family/ patient when able. Family/ Patient consents to treatment and plan if able to do so.    MEDICATIONS ORDERED:  Orders Placed This Encounter   Medications   ??? ondansetron (ZOFRAN) injection 4 mg   ??? AND Linked Order Group    ??? pantoprazole (PROTONIX) injection 40 mg    ??? sodium chloride (PF) 0.9 % injection 10 mL   ??? aluminum & magnesium hydroxide-simethicone (MAALOX) 200-200-20 MG/5ML suspension 30 mL   ??? acetaminophen (TYLENOL) tablet 1,000 mg   ??? promethazine (PHENERGAN) injection 12.5 mg   ??? 0.9 % sodium chloride bolus   ??? ioversol (OPTIRAY) 74 % injection 130 mL   ??? famotidine (PEPCID) 20 MG tablet     Sig: Take 1 tablet by mouth 2 times daily     Dispense:  60 tablet     Refill:  0   ??? DISCONTD: ondansetron (ZOFRAN ODT) 4 MG disintegrating tablet     Sig: Take 1 tablet by mouth every 8 hours as needed for Nausea     Dispense:  20 tablet     Refill:  0   ??? promethazine (PHENERGAN) 25 MG tablet     Sig: Take 1 tablet by mouth every 6 hours as needed for Nausea WARNING:  May cause drowsiness.  May impair ability to operate vehicles or machinery.  Do not use in combination with alcohol.     Dispense:  20 tablet     Refill:  0       DDX:  GERD, gastritis, gastroenteritis, viral illness, gallbladder illness, appendicitis, gastroparesis, diverticulitis, diverticulosis, cyclic nausea and vomiting    DIAGNOSTIC RESULTS      LABS:  Results for orders placed or performed during the hospital encounter of 12/29/15   CBC WITH AUTO DIFFERENTIAL   Result Value Ref Range    WBC 7.7 3.5 - 11.0 k/uL    RBC 4.86 4.5 - 5.9 m/uL    Hemoglobin 14.1 13.5 - 17.5 g/dL    Hematocrit 71.6 41 - 53 %    MCV 85.9 80 - 100 fL    MCH 29.0 26 - 34 pg    MCHC 33.7 31 - 37 g/dL    RDW 96.7 89.3 - 81.0 %    Platelets 228 140 - 450 k/uL    MPV 8.3 6.0 - 12.0 fL    Differential Type NOT REPORTED     Seg Neutrophils 59 %    Lymphocytes 31 %    Monocytes 7 %  Eosinophils % 3 %    Basophils 0 %    Segs Absolute 4.60 1.8 - 7.7 k/uL    Absolute Lymph # 2.40 1.0 - 4.8 k/uL    Absolute Mono # 0.60 0.1 - 1.2 k/uL    Absolute Eos # 0.20 0.0 - 0.4 k/uL    Basophils # 0.00 0.0 - 0.2 k/uL    WBC Morphology NOT REPORTED     RBC Morphology NOT REPORTED     Platelet Estimate NOT REPORTED    Comprehensive Metabolic Panel   Result Value Ref Range    Glucose 102 (H) 70 - 99 mg/dL    BUN 16 6 - 20 mg/dL    CREATININE 9.60 4.54 - 1.20 mg/dL    Bun/Cre Ratio NOT REPORTED 9 - 20    Calcium 9.5 8.6 - 10.4 mg/dL    Sodium 098 119 - 147 mmol/L    Potassium 4.4 3.7 - 5.3 mmol/L    Chloride 100 98 - 107 mmol/L    CO2 26 20 - 31 mmol/L    Anion Gap 12 9 - 17 mmol/L    Alkaline Phosphatase 91 40 - 129 U/L    ALT 41 5 - 41 U/L    AST 27 <40 U/L    Total Bilirubin 0.18 (L) 0.3 - 1.2 mg/dL    Total Protein 7.7 6.4 - 8.3 g/dL    Alb 4.3 3.5 - 5.2 g/dL    Albumin/Globulin Ratio 1.3 1.0 - 2.5    GFR Non-African American >60 >60 mL/min    GFR African American >60 >60 mL/min    GFR Comment          GFR Staging NOT REPORTED    LIPASE   Result Value Ref Range    Lipase 26 13 - 60 U/L   URINALYSIS WITH MICROSCOPIC   Result Value Ref Range    Color, UA YELLOW YEL    Turbidity UA CLOUDY (A) CLEAR    Glucose, Ur NEGATIVE  NEG    Bilirubin Urine NEGATIVE NEG    Ketones, Urine NEGATIVE NEG    Specific Gravity, UA 1.018 1.005 - 1.030    Urine Hgb NEGATIVE NEG    pH, UA 7.5 5.0 - 8.0    Protein, UA NEGATIVE NEG    Urobilinogen, Urine Normal NORM    Nitrite, Urine NEGATIVE NEG    Leukocyte Esterase, Urine NEGATIVE NEG    -          WBC, UA None 0 - 5 /HPF    RBC, UA 0 TO 2 0 - 4 /HPF    Casts UA 0 TO 2 HYALINE 0 - 8 /LPF    Crystals UA NOT REPORTED NONE /HPF    Epithelial Cells UA None 0 - 5 /HPF    Renal Epithelial, Urine NOT REPORTED 0 /HPF    Bacteria, UA NOT REPORTED NONE    Mucus, UA NOT REPORTED NONE    Trichomonas, UA NOT REPORTED NONE    Amorphous, UA NOT REPORTED NONE    Other Observations UA NOT REPORTED NREQ    Yeast, UA NOT REPORTED NONE     Labs Reviewed   COMPREHENSIVE METABOLIC PANEL - Abnormal; Notable for the following:        Result Value    Glucose 102 (*)     Total Bilirubin 0.18 (*)     All other components within normal limits   URINALYSIS WITH MICROSCOPIC - Abnormal; Notable for the following:  Turbidity UA CLOUDY (*)     All other components within normal limits   CBC WITH AUTO DIFFERENTIAL   LIPASE       RADIOLOGY:  Xr Acute Abd Series Chest 1 Vw    Result Date: 12/15/2015  EXAMINATION: ACUTE ABDOMINAL SERIES 12/15/2015 6:21 am COMPARISON: None. HISTORY: ORDERING SYSTEM PROVIDED HISTORY: hematemesis TECHNOLOGIST PROVIDED HISTORY: Reason for exam:->hematemesis Acuity: Unknown Type of Exam: Unknown FINDINGS: Cardiac silhouette is normal in size.  Lungs are clear.  No findings to suggest free air.  No air-filled dilated loops of bowel.  Mild rightward curvature of the lumbar spine.     No acute findings     Ct Abdomen Pelvis W Iv Contrast Additional Contrast? None    Result Date: 12/29/2015  EXAMINATION: CT OF THE ABDOMEN AND PELVIS WITH CONTRAST 12/29/2015 7:00 am TECHNIQUE: CT of the abdomen and pelvis was performed with the administration of intravenous contrast. Multiplanar reformatted images are provided for  review. Dose modulation, iterative reconstruction, and/or weight based adjustment of the mA/kV was utilized to reduce the radiation dose to as low as reasonably achievable. COMPARISON: None. HISTORY: ORDERING SYSTEM PROVIDED HISTORY: left sided abdominal pain TECHNOLOGIST PROVIDED HISTORY: Additional Contrast?->None FINDINGS: Lower Chest: Minimal atelectatic changes are present in the posterobasal segment of the right lower lobe.  Lung bases are otherwise clear.  Heart size is normal.  There is no osseous abnormalities identified. Organs: Diffuse hepatic steatosis suspected with focal sparing in the gallbladder fossa area.  Slight high density in the anterior aspect of the gallbladder fossa could potentially represent a small hemangioma versus focal sparing.  The spleen, pancreas, gallbladder, both adrenal glands and the right kidney has normal CT appearance.  There is a benign-appearing 13 mm cortical cyst in the upper pole of the left kidney. GI/Bowel: A small amount of scattered stool present in the large bowel especially in the ascending colon.  There is no evidence of obstruction or ileus.  A normal appendix is noted in the right lower quadrant. Pelvis: Bladder is well distended.  The posterior wall of the bladder could not be evaluated due to swirling contrast especially on the right side. There is apparently a 39 mm round ring-enhancing masslike area in the right side of the bladder.  No lymphadenopathy identified. Peritoneum/Retroperitoneum: Abdominal aorta is of normal caliber.  There is no pelvic or abdominal lymphadenopathy present. Bones/Soft Tissues: Osseous structures are normal.  There is no gross soft tissue abnormalities.     A 39 mm ring-enhancing mass like area on the right side of the bladder could be better evaluated with sonography, this could not be accurately evaluated due to swirling contrast in the bladder. Benign-appearing cyst present in the left kidney.  Diffuse fatty infiltration of the  liver for the small area of focal sparing in the gallbladder fossa area versus hemangioma. No acute intra-abdominal process identified.       EKG      All EKG's are interpreted by the Emergency Department Physician who either signs or Co-signs this chart in the absence of a cardiologist.    ED BEDSIDE ULTRASOUND:   Not indicated      EMERGENCY DEPARTMENT COURSE / MDM       EMERGENCY DEPARTMENT COURSE/MDM  Patient presents with persistent nausea and vomiting and left upper quadrant abdominal pain.  On exam patient acute distress.  Heart regular rate and rhythm, lungs clear auscultation bilaterally, abdomen with left upper quadrant tenderness with guarding but no rebound.  No flank  tenderness.  We'll obtain abdominal lab work, give antiemetics and then take your medications as well as Tylenol.  Plan for reassessment after labs.  If no significant improvement plan on CT abdomen and pelvis.    Still nauseous and tender.  Given phenergan.  CT ordered.  Labwork unremarkable.      Pt care signed out to Dr. Rennis HardingEllis.    PROCEDURES:  Procedures    CONSULTS:  None    CRITICAL CARE:  See attending note    FINAL IMPRESSION      1. Hematemesis, presence of nausea not specified    2. Left upper quadrant pain    3. Cyst of left kidney              DISPOSITION / PLAN     DISPOSITION     pending    PATIENT REFERRED TO:  Cherylann RatelFlorentina Chirica, MD  [address], [address]  [place] [id number]  [phone]    Go today  for your scheduled appointment        Schedule an appointment as soon as possible for a visit        DISCHARGE MEDICATIONS:  Discharge Medication List as of 12/29/2015  8:41 AM      START taking these medications    Details   famotidine (PEPCID) 20 MG tablet Take 1 tablet by mouth 2 times daily, Disp-60 tablet, R-0Print      ondansetron (ZOFRAN ODT) 4 MG disintegrating tablet Take 1 tablet by mouth every 8 hours as needed for Nausea, Disp-20 tablet, R-0Print             Saverio DankerKevin J Trayce Maino, DO  Emergency Medicine  Resident    (Please note that portions of this note were completed with a voice recognition program.  Efforts were made to edit the dictations but occasionally words are mis-transcribed.)        Saverio DankerKevin J Boubacar Lerette, DO  Resident  12/29/15 1609

## 2015-12-29 NOTE — ED Triage Notes (Signed)
4th visit in past 322-months for N/V + hematemesis. This visit also C/O left flank pain

## 2015-12-29 NOTE — ED Provider Notes (Signed)
[facility]     Emergency Department     Faculty Attestation        I performed a history and physical examination of the patient and discussed management with the resident. I reviewed the resident???s note and agree with the documented findings and plan of care. Any areas of disagreement are noted on the chart. I was personally present for the key portions of any procedures. I have documented in the chart those procedures where I was not present during the key portions. I have reviewed the emergency nurses triage note. I agree with the chief complaint, past medical history, past surgical history, allergies, medications, social and family history as documented unless otherwise noted below.  For Physician Assistant/ Nurse Practitioner cases/documentation I have personally evaluated this patient and have completed at least one if not all key elements of the E/M (history, physical exam, and MDM). Additional findings are as noted.    Vital Signs:BP: 137/80   Pulse: 95   Resp: 16   Temp: 98.1 ??F (36.7 ??C)  SpO2: 94 %  PCP:  Cherylann RatelFlorentina Chirica, MD    Pertinent Comments:         Critical Care  None      (Please note that portions of this note were completed with a voice recognition program. Efforts were made to edit the dictations but occasionally words are mis-transcribed. Whenever words are used in this note in any gender, they shall be construed as though they were used in the gender appropriate to the circumstances; and whenever words are used in this note in the singular or plural form, they shall be construed as though they were used in the form appropriate to the circumstances.)    Chevis PrettyAaron Makar Slatter, MD Lacie ScottsFAAEM FACEP  Attending Emergency Medicine Physician              Salomon MastAaron M Melo Stauber, MD  12/29/15 [phone]

## 2015-12-29 NOTE — ED Provider Notes (Signed)
[facility] [facility] ED  Emergency Department  Emergency Medicine Resident Sign-out     Care of DIMITRIOS BALESTRIERI was assumed from Dr. Benna Dunks and is being seen for Nausea; Hematemesis; and Abdominal Pain  .  The patient's initial evaluation and plan have been discussed with the prior provider who initially evaluated the patient.     EMERGENCY DEPARTMENT COURSE / MEDICAL DECISION MAKING:       MEDICATIONS GIVEN:  Orders Placed This Encounter   Medications   ??? ondansetron (ZOFRAN) injection 4 mg   ??? AND Linked Order Group    ??? pantoprazole (PROTONIX) injection 40 mg    ??? sodium chloride (PF) 0.9 % injection 10 mL   ??? aluminum & magnesium hydroxide-simethicone (MAALOX) 200-200-20 MG/5ML suspension 30 mL   ??? acetaminophen (TYLENOL) tablet 1,000 mg   ??? promethazine (PHENERGAN) injection 12.5 mg   ??? 0.9 % sodium chloride bolus   ??? ioversol (OPTIRAY) 74 % injection 130 mL   ??? famotidine (PEPCID) 20 MG tablet     Sig: Take 1 tablet by mouth 2 times daily     Dispense:  60 tablet     Refill:  0   ??? ondansetron (ZOFRAN ODT) 4 MG disintegrating tablet     Sig: Take 1 tablet by mouth every 8 hours as needed for Nausea     Dispense:  20 tablet     Refill:  0       LABS / RADIOLOGY:     Labs Reviewed   COMPREHENSIVE METABOLIC PANEL - Abnormal; Notable for the following:        Result Value    Glucose 102 (*)     Total Bilirubin 0.18 (*)     All other components within normal limits   URINALYSIS WITH MICROSCOPIC - Abnormal; Notable for the following:     Turbidity UA CLOUDY (*)     All other components within normal limits   CBC WITH AUTO DIFFERENTIAL   LIPASE       Xr Acute Abd Series Chest 1 Vw    Result Date: 12/15/2015  EXAMINATION: ACUTE ABDOMINAL SERIES 12/15/2015 6:21 am COMPARISON: None. HISTORY: ORDERING SYSTEM PROVIDED HISTORY: hematemesis TECHNOLOGIST PROVIDED HISTORY: Reason for exam:->hematemesis Acuity: Unknown Type of Exam: Unknown FINDINGS: Cardiac silhouette is normal in size.  Lungs are clear.  No findings  to suggest free air.  No air-filled dilated loops of bowel.  Mild rightward curvature of the lumbar spine.     No acute findings       RECENT VITALS:     Temp: 98.1 ??F (36.7 ??C),  Pulse: 81, Resp: 16, BP: 115/73, SpO2: 94 %    This patient is a 30 y.o. Male with abdominal pain, 4th ER visit recently. Awaiting CT abdomen/pelvis.  Sx more consistent with gastritis,   Has pcp appointment today, with plan to get o/p EGD  8:40 AM  Patient informed of results of CT scan including ?bladder mass, and Renal cyst. Will add protonix, and zofran    OUTSTANDING TASKS / RECOMMENDATIONS:    1. Ct ab/pelvis     FINAL IMPRESSION:     1. Hematemesis, presence of nausea not specified    2. Left upper quadrant pain    3. Cyst of left kidney        DISPOSITION:         DISPOSITION:    Discharge     Transfer -      Admission -       Against Medical Advice   []   Eloped   FOLLOW-UP: Cherylann RatelFlorentina Chirica, MD  [address], [address]  [place] [id number]  [phone]    Go today  for your scheduled appointment     DISCHARGE MEDICATIONS: New Prescriptions    FAMOTIDINE (PEPCID) 20 MG TABLET    Take 1 tablet by mouth 2 times daily    ONDANSETRON (ZOFRAN ODT) 4 MG DISINTEGRATING TABLET    Take 1 tablet by mouth every 8 hours as needed for Nausea          Alona Benehristina Ayisha Pol, DO  Emergency Medicine Resident  [facility]       Anette Guarnerihristina R Brooklyne Radke, [place]  Resident  12/29/15 [phone]

## 2015-12-29 NOTE — ED Notes (Signed)
Pt resting in cot with eyes closed-respirations even and non-labored. Awaiting CT results.      Sarita BottomAmy Vantasia Pinkney, RN  12/29/15 [phone]

## 2015-12-29 NOTE — Progress Notes (Signed)
Addressed in ED  Labs within normal limits   CT abdomen -no acute disease

## 2015-12-29 NOTE — Progress Notes (Signed)
Addressed in ED

## 2015-12-29 NOTE — ED Notes (Signed)
Dr.Ellis at bedside to update pt on POC and test results.      Sarita BottomAmy Keilyn Nadal, RN  12/29/15 [phone]

## 2016-01-02 ENCOUNTER — Encounter: Primary: Family

## 2016-02-18 ENCOUNTER — Encounter: Attending: Internal Medicine | Primary: Family

## 2016-02-24 ENCOUNTER — Encounter: Attending: Family | Primary: Family

## 2016-02-25 ENCOUNTER — Encounter: Attending: Family Medicine | Primary: Family

## 2016-03-23 ENCOUNTER — Emergency Department: Admit: 2016-03-23 | Payer: PRIVATE HEALTH INSURANCE | Primary: Family

## 2016-03-23 ENCOUNTER — Inpatient Hospital Stay
Admit: 2016-03-23 | Discharge: 2016-03-23 | Disposition: A | Discharge status: Home / Self Care | Payer: PRIVATE HEALTH INSURANCE | Admitting: Emergency Medicine

## 2016-03-23 DIAGNOSIS — K2270 Barrett's esophagus without dysplasia: Secondary | ICD-10-CM

## 2016-03-23 LAB — CBC WITH AUTO DIFFERENTIAL
Absolute Eos #: 0.1 10*3/uL (ref 0.0–0.4)
Absolute Lymph #: 1.6 10*3/uL (ref 1.0–4.8)
Absolute Mono #: 0.5 10*3/uL (ref 0.1–1.2)
Basophils Absolute: 0 10*3/uL (ref 0.0–0.2)
Basophils: 0 %
Eosinophils %: 1 %
Hematocrit: 46.5 % (ref 41–53)
Hemoglobin: 15.6 g/dL (ref 13.5–17.5)
Lymphocytes: 18 %
MCH: 28.9 pg (ref 26–34)
MCHC: 33.5 g/dL (ref 31–37)
MCV: 86.3 fL (ref 80–100)
MPV: 8.2 fL (ref 6.0–12.0)
Monocytes: 5 %
Platelets: 225 10*3/uL (ref 140–450)
RBC: 5.38 m/uL (ref 4.5–5.9)
RDW: 13.3 % (ref 12.5–15.4)
Seg Neutrophils: 76 %
Segs Absolute: 7 10*3/uL (ref 1.8–7.7)
WBC: 9.2 10*3/uL (ref 3.5–11.0)

## 2016-03-23 LAB — BASIC METABOLIC PANEL
Anion Gap: 14 mmol/L (ref 9–17)
BUN: 11 mg/dL (ref 6–20)
CO2: 27 mmol/L (ref 20–31)
Calcium: 9.7 mg/dL (ref 8.6–10.4)
Chloride: 102 mmol/L (ref 98–107)
Creatinine: 0.88 mg/dL (ref 0.70–1.20)
GFR African American: >60 mL/min (ref >60)
GFR Non-African American: >60 mL/min (ref >60)
Glucose: 102 mg/dL — ABNORMAL HIGH (ref 70–99)
Potassium: 4.3 mmol/L (ref 3.7–5.3)
Sodium: 143 mmol/L (ref 135–144)

## 2016-03-23 LAB — HEPATIC FUNCTION PANEL
ALT: 47 U/L — ABNORMAL HIGH (ref 5–41)
AST: 35 U/L (ref <40)
Albumin/Globulin Ratio: 1.5 (ref 1.0–2.5)
Albumin: 4.6 g/dL (ref 3.5–5.2)
Alkaline Phosphatase: 88 U/L (ref 40–129)
Bilirubin, Direct: <0.08 mg/dL (ref <0.31)
Total Bilirubin: 0.26 mg/dL — ABNORMAL LOW (ref 0.3–1.2)
Total Protein: 7.6 g/dL (ref 6.4–8.3)

## 2016-03-23 LAB — LIPASE: Lipase: 27 U/L (ref 13–60)

## 2016-03-23 MED ORDER — FEXOFENADINE HCL 180 MG PO TABS
180 MG | Freq: Every day | ORAL | Status: DC
Start: 2016-03-23 — End: 2016-03-23

## 2016-03-23 MED ORDER — SODIUM CHLORIDE 0.9 % IV BOLUS
0.9 % | Freq: Once | INTRAVENOUS | Status: AC
Start: 2016-03-23 — End: 2016-03-23
  Administered 2016-03-23: 09:00:00 1000 mL via INTRAVENOUS

## 2016-03-23 MED ORDER — MIDAZOLAM HCL 2 MG/2ML IJ SOLN
2 MG/ML | INTRAMUSCULAR | Status: DC | PRN
Start: 2016-03-23 — End: 2016-03-23
  Administered 2016-03-23: 20:00:00 1 via INTRAVENOUS

## 2016-03-23 MED ORDER — GABAPENTIN 400 MG PO CAPS
400 MG | ORAL_CAPSULE | Freq: Three times a day (TID) | ORAL | 0 refills | Status: DC
Start: 2016-03-23 — End: 2017-02-21

## 2016-03-23 MED ORDER — CETIRIZINE HCL 10 MG PO TABS
10 | Freq: Every day | ORAL | Status: DC
Start: 2016-03-23 — End: 2016-03-23
  Administered 2016-03-23: 13:00:00 10 mg via ORAL

## 2016-03-23 MED ORDER — LIDOCAINE HCL (PF) 1 % IJ SOLN
1 % | INTRAMUSCULAR | Status: DC | PRN
Start: 2016-03-23 — End: 2016-03-23
  Administered 2016-03-23: 20:00:00 50 via INTRAVENOUS
  Administered 2016-03-23: 20:00:00 20 via INTRAVENOUS

## 2016-03-23 MED ORDER — PROPOFOL 200 MG/20ML IV EMUL
200 MG/20ML | INTRAVENOUS | Status: DC | PRN
Start: 2016-03-23 — End: 2016-03-23
  Administered 2016-03-23: 20:00:00 10 via INTRAVENOUS
  Administered 2016-03-23: 20:00:00 20 via INTRAVENOUS
  Administered 2016-03-23 (×3): 10 via INTRAVENOUS
  Administered 2016-03-23: 20:00:00 20 via INTRAVENOUS
  Administered 2016-03-23 (×2): 50 via INTRAVENOUS
  Administered 2016-03-23: 20:00:00 30 via INTRAVENOUS
  Administered 2016-03-23: 20:00:00 10 via INTRAVENOUS
  Administered 2016-03-23: 20:00:00 30 via INTRAVENOUS

## 2016-03-23 MED ORDER — PROMETHAZINE HCL 25 MG PO TABS
25 MG | ORAL_TABLET | Freq: Four times a day (QID) | ORAL | 0 refills | Status: DC | PRN
Start: 2016-03-23 — End: 2017-02-21

## 2016-03-23 MED ORDER — NORMAL SALINE FLUSH 0.9 % IV SOLN
0.9 | INTRAVENOUS | Status: DC | PRN
Start: 2016-03-23 — End: 2016-03-23

## 2016-03-23 MED ORDER — PROMETHAZINE HCL 25 MG/ML IJ SOLN
25 MG/ML | Freq: Once | INTRAMUSCULAR | Status: AC
Start: 2016-03-23 — End: 2016-03-23
  Administered 2016-03-23: 16:00:00 12.5 mg via INTRAVENOUS

## 2016-03-23 MED ORDER — METHADONE HCL 5 MG/5ML PO SOLN
5 MG/ML | Freq: Every day | ORAL | Status: DC
Start: 2016-03-23 — End: 2016-03-23
  Administered 2016-03-23: 13:00:00 45 mg via ORAL

## 2016-03-23 MED ORDER — ONDANSETRON HCL 4 MG/2ML IJ SOLN
4 MG/2ML | Freq: Three times a day (TID) | INTRAMUSCULAR | Status: DC | PRN
Start: 2016-03-23 — End: 2016-03-23
  Administered 2016-03-23 (×2): 4 mg via INTRAVENOUS

## 2016-03-23 MED ORDER — DICYCLOMINE HCL 10 MG/ML IM SOLN
10 MG/ML | Freq: Once | INTRAMUSCULAR | Status: AC
Start: 2016-03-23 — End: 2016-03-23
  Administered 2016-03-23: 10:00:00 20 mg via INTRAMUSCULAR

## 2016-03-23 MED ORDER — FENTANYL CITRATE (PF) 100 MCG/2ML IJ SOLN
100 MCG/2ML | INTRAMUSCULAR | Status: DC | PRN
Start: 2016-03-23 — End: 2016-03-23
  Administered 2016-03-23: 20:00:00 50 via INTRAVENOUS

## 2016-03-23 MED ORDER — ONDANSETRON HCL 4 MG/2ML IJ SOLN
4 MG/2ML | Freq: Once | INTRAMUSCULAR | Status: AC
Start: 2016-03-23 — End: 2016-03-23
  Administered 2016-03-23: 10:00:00 4 mg via INTRAVENOUS

## 2016-03-23 MED ORDER — GABAPENTIN 400 MG PO CAPS
400 MG | Freq: Three times a day (TID) | ORAL | Status: DC
Start: 2016-03-23 — End: 2016-03-23
  Administered 2016-03-23: 13:00:00 400 mg via ORAL

## 2016-03-23 MED ORDER — FAMOTIDINE 20 MG/2ML IV SOLN
20 MG/2ML | Freq: Once | INTRAVENOUS | Status: AC
Start: 2016-03-23 — End: 2016-03-23
  Administered 2016-03-23: 09:00:00 20 mg via INTRAVENOUS

## 2016-03-23 MED ORDER — NORMAL SALINE FLUSH 0.9 % IV SOLN
0.9 | Freq: Two times a day (BID) | INTRAVENOUS | Status: DC
Start: 2016-03-23 — End: 2016-03-23

## 2016-03-23 MED ORDER — ACETAMINOPHEN 325 MG PO TABS
325 MG | ORAL | Status: DC | PRN
Start: 2016-03-23 — End: 2016-03-23

## 2016-03-23 MED ORDER — PROMETHAZINE HCL 25 MG/ML IJ SOLN
25 MG/ML | Freq: Once | INTRAMUSCULAR | Status: DC
Start: 2016-03-23 — End: 2016-03-23

## 2016-03-23 MED ORDER — PROMETHAZINE HCL 25 MG/ML IJ SOLN
25 MG/ML | Freq: Once | INTRAMUSCULAR | Status: AC
Start: 2016-03-23 — End: 2016-03-23
  Administered 2016-03-23: 09:00:00 25 mg via INTRAMUSCULAR

## 2016-03-23 MED ORDER — SODIUM CHLORIDE 0.9 % IV SOLN
0.9 | INTRAVENOUS | Status: DC
Start: 2016-03-23 — End: 2016-03-23
  Administered 2016-03-23: 11:00:00 via INTRAVENOUS

## 2016-03-23 MED ORDER — FLUTICASONE PROPIONATE 50 MCG/ACT NA SUSP
50 MCG/ACT | Freq: Every day | NASAL | Status: DC
Start: 2016-03-23 — End: 2016-03-23
  Administered 2016-03-23: 13:00:00 2 via NASAL

## 2016-03-23 MED FILL — CETIRIZINE HCL 10 MG PO TABS: 10 MG | ORAL | Qty: 1

## 2016-03-23 MED FILL — GABAPENTIN 400 MG PO CAPS: 400 MG | ORAL | Qty: 1

## 2016-03-23 MED FILL — BENTYL 10 MG/ML IM SOLN: 10 MG/ML | INTRAMUSCULAR | Qty: 2

## 2016-03-23 MED FILL — ONDANSETRON HCL 4 MG/2ML IJ SOLN: 4 MG/2ML | INTRAMUSCULAR | Qty: 2

## 2016-03-23 MED FILL — PROMETHAZINE HCL 25 MG/ML IJ SOLN: 25 MG/ML | INTRAMUSCULAR | Qty: 1

## 2016-03-23 MED FILL — FLUTICASONE PROPIONATE 50 MCG/ACT NA SUSP: 50 MCG/ACT | NASAL | Qty: 16

## 2016-03-23 MED FILL — FAMOTIDINE 20 MG/2ML IV SOLN: 20 MG/2ML | INTRAVENOUS | Qty: 2

## 2016-03-23 MED FILL — METHADONE HCL 5 MG/5ML PO SOLN: 5 MG/ML | ORAL | Qty: 45

## 2016-03-23 NOTE — Plan of Care (Signed)
Problem: Safety:  Goal: Free from accidental physical injury  Free from accidental physical injury   Outcome: Ongoing  Pt was free of falls during shift; bed in lowest position, call light in reach and bed wheels locked. Will continue to assess and assist as needed. Electronically signed by Delfino Lovett, RN on 03/23/16 at 7:07 AM

## 2016-03-23 NOTE — Plan of Care (Signed)
Problem: Infection:  Goal: Will remain free from infection  Will remain free from infection   Outcome: Ongoing    Problem: Safety:  Goal: Free from accidental physical injury  Free from accidental physical injury   Outcome: Ongoing    Problem: Daily Care:  Goal: Daily care needs are met  Daily care needs are met   Outcome: Ongoing    Problem: Skin Integrity:  Goal: Skin integrity will stabilize  Skin integrity will stabilize   Outcome: Ongoing    Problem: Discharge Planning:  Goal: Patients continuum of care needs are met  Patients continuum of care needs are met   Outcome: Ongoing    Problem: Pain:  Goal: Pain level will decrease  Pain level will decrease   Outcome: Ongoing  Goal: Control of acute pain  Control of acute pain   Outcome: Not Met This Shift  Goal: Control of chronic pain  Control of chronic pain   Outcome: Ongoing

## 2016-03-23 NOTE — ED Provider Notes (Signed)
[address] [facility]     Emergency Department     Faculty Attestation    I performed a history and physical examination of the patient and discussed management with the resident. I reviewed the resident???s note and agree with the documented findings including all diagnostic interpretations and plan of care. Any areas of disagreement are noted on the chart. I was personally present for the key portions of any procedures. I have documented in the chart those procedures where I was not present during the key portions. I have reviewed the emergency nurses triage note. I agree with the chief complaint, past medical history, past surgical history, allergies, medications, social and family history as documented unless otherwise noted below. Documentation of the HPI, Physical Exam and Medical Decision Making performed by scribes is based on my personal performance of the HPI, PE and MDM. For Physician Assistant/ Nurse Practitioner cases/documentation I have personally evaluated this patient and have completed at least one if not all key elements of the E/M (history, physical exam, and MDM). Additional findings are as noted.    Primary Care Physician: No primary care provider on file.    History: This is a 30 y.o. male who presents to the Emergency Department with complaint of abdominal pain, hematemesis.  Patient reports episodes today starting while he was watching TV described as pressure sensation or any up into the chest.  Vomiting with some streaks of blood.  Also has noticed dark tarry stools.  No diarrhea.  No pain or burning with urination.  He was to follow-up with GI outpatient however was unable to do so due to insurance issues.    Physical:     height is 5\' 8"  (1.727 m) and weight is 235 lb (106.6 kg). His oral temperature is 97.3 ??F (36.3 ??C). His blood pressure is 129/84 and his pulse is 78. His respiration is 16 and oxygen saturation is 96%.    30 y.o. male  appears uncomfortable but nontoxic, oropharynx clear and moist, cardiac exam regular rate and rhythm no murmurs or gallops complications and clear to auscultation bilaterally, abdomen is soft, epigastric tenderness noted, no rebound or guarding, radial pulses 2+ bilaterally.  Rectal exam per the resident is positive guaiac.    Impression: Abdominal pain, hematemesis, suspect gastric ulcer    Plan: Abdominal labs, acute abdominal series with chest, admission to ETU with GI consult given outpatient failure      Pre-hypertension/Hypertension: The patient has been informed that they may have pre-hypertension or Hypertension based on a blood pressure reading in the emergency department. I recommend that the patient call the primary care provider listed on their discharge instructions or a physician of their choice this week to arrange follow up for further evaluation of possible pre-hypertension or Hypertension.      Blain Pais, MD  Attending Emergency Physician        Lanice Schwab, MD  03/23/16 [phone]

## 2016-03-23 NOTE — Progress Notes (Signed)
CDU Daily Progress Note  Attending Physician       Pt Name: David [name]  MRN: [id number]  Birthdate 09/21/1985  Date of evaluation: 03/23/16    I performed a history and physical examination of the patient and discussed management with the resident. I reviewed the resident???s note and agree with the documented findings and plan of care. Any areas of disagreement are noted on the chart. I was personally present for the key portions of any procedures. I have documented in the chart those procedures where I was not present during the key portions. I have reviewed the emergency nurses triage note. I agree with the chief complaint, past medical history, past surgical history, allergies, medications, social and family history as documented unless otherwise noted below. Documentation of the HPI, Physical Exam and Medical Decision Making performed by medical students or scribes is based on my personal performance of the HPI, PE and MDM. For Physician Assistant/ Nurse Practitioner cases/documentation I have personally evaluated this patient and have completed at least one if not all key elements of the E/M (history, physical exam, and MDM). Additional findings are as noted.    The Family History, Social History and Review of Systems are unchanged from the previous day. No significant events overnight.    GI consult    [name],  DO  Attending Physician  Critical Decision Unit

## 2016-03-23 NOTE — ED Provider Notes (Signed)
[place] [facility] ED  Emergency Department  Emergency Medicine Resident Sign-out     Care of David [name] was assumed from Dr. Sunday Corn and is being seen for Abdominal Pain (x2 hours, states pressure); Other (c/o rectal pain, black stool earlier today); and Chest Pain (c/o chest tightness)  .  The patient's initial evaluation and plan have been discussed with the prior provider who initially evaluated the patient.     EMERGENCY DEPARTMENT COURSE / MEDICAL DECISION MAKING:       MEDICATIONS GIVEN:  Orders Placed This Encounter   Medications   ??? DISCONTD: promethazine (PHENERGAN) injection 25 mg   ??? 0.9 % sodium chloride bolus   ??? famotidine (PEPCID) injection 20 mg   ??? promethazine (PHENERGAN) injection 25 mg   ??? dicyclomine (BENTYL) injection 20 mg   ??? ondansetron (ZOFRAN) injection 4 mg       LABS / RADIOLOGY:     Labs Reviewed   BASIC METABOLIC PANEL - Abnormal; Notable for the following:        Result Value    Glucose 102 (*)     All other components within normal limits   HEPATIC FUNCTION PANEL - Abnormal; Notable for the following:     ALT 47 (*)     Total Bilirubin 0.26 (*)     All other components within normal limits   CBC WITH AUTO DIFFERENTIAL   LIPASE       All forms of imaging other than ED bedside ultrasounds are viewed and interpreted by a radiologist and their interpretations are displayed below.  No results found.    RECENT VITALS:     Temp: 97.3 ??F (36.3 ??C),  Pulse: 78, Resp: 16, BP: 129/84, SpO2: 96 %    This patient is a 30 y.o. Male with abdominal pain, guiac positive  . Hb ok. ETU for GI to see    ED Course         OUTSTANDING TASKS / RECOMMENDATIONS:    1. none     FINAL IMPRESSION:     1. Epigastric pain    2. Non-intractable vomiting with nausea, unspecified vomiting type        DISPOSITION:         DISPOSITION:  []   Discharge   []   Transfer -    [x]   Admission -     []   Against Medical Advice   []   Eloped   FOLLOW-UP: No follow-up provider specified.   DISCHARGE MEDICATIONS: New  Prescriptions    No medications on file          Ellouise Newer, DO  Emergency Medicine Resident  [facility], [place]  Resident  03/23/16 [phone]

## 2016-03-23 NOTE — ED Notes (Signed)
Pt is a/ox4.  Pt states that 2 hours ago he was watching tv and started having abdominal pain/pressure along with rectal pain having black stools, n/v with yellow and red emesis along with chest tightness.  Pt states that he also started to have some SOB but it has gotten better.  Pt denies smoking, etoh and drug use.  Pt vitals assessed, noted above.       Melika Reder Montez Moritaarter  03/23/16 0421

## 2016-03-23 NOTE — Care Coordination-Inpatient (Addendum)
Case Management Initial Discharge Plan  David [name],         Readmission Risk              Readmission Risk:        2.5       Age 30 or Greater:  0    Admitted from SNF or Requires Paid or Family Care:  0    Currently has CHF,COPD,ARF,CRI,or is on dialysis:  0    Takes more than 5 Prescription Medications:  0    Takes Digoxin,Insulin,Anticoagulants,Narcotics or ASA/Plavix:  Industry in Past 12 Months:  0    On Disability:  0    Patient Considers own Health:  2.5            Met with:patient to discuss discharge plans.   Information verified: address, contacts, phone number, DOB, insurance Yes  PCP: No primary care provider on file.  Date of last visit: list provided     Insurance Provider: [facility]     Discharge Planning  Current Residence:  Private Residence  Living Arrangements:  [place] has 2 stories/flight  stairs to climb  Support Systems:  Family Members  Current Services PTA:  None Supplier: none   Patient able to perform ADL's:Independent  DME used to aid ambulation prior to admission: none /during admission none     Potential Assistance Needed:  N/A    Pharmacy: [facility]    Potential Assistance Purchasing Medications:  No  Does patient want to participate in local refill/ meds to beds program?  No    Patient agreeable to home care: No  Freedom of choice provided:  n/a      Type of Home Care Services:  None  Patient expects to be discharged to:  Home    Prior SNF/Rehab Placement and Facility: none  Agreeable to SNF/Rehab: No  Freedom of choice provided: n/a  Social Services Evaluation: n/a    Expected Discharge date:  03/24/16  Follow Up Appointment: Best Day/ Time: Tuesday AM    Transportation provider: family  Transportation arrangements needed for discharge: No    Discharge Plan: Plan to discharge to home independently; [place] appt made : patient has been dismissed from [facility]. [facility] appointment made for Monday Sept 18th @ 1:30pm       Electronically signed by Sue [place], RN on 03/23/16  at 11:31 AM

## 2016-03-23 NOTE — H&P (Signed)
[facility]  [facility] / OBSERVATION eNCOUnter  Resident Note     Pt Name: David [name]  MRN: [id number]  Birthdate 03/28/1986  Date of evaluation: 03/23/16  Patient's PCP is :  No primary care provider on file.    CHIEF COMPLAINT       Chief Complaint   Patient presents with   ??? Abdominal Pain     x2 hours, states pressure   ??? Other     c/o rectal pain, black stool earlier today   ??? Chest Pain     c/o chest tightness         HISTORY OF PRESENT ILLNESS    David [name] is a 30 y.o. male who presents Patient with acute on chronic abdominal pain.  Patient states that he has been seen multiple times in the emergency department for similar symptoms, has been treated with Phenergan and discharged home.  He does state that the Phenergan has helped with his nausea, but he continues to have intermittent episodes of this abdominal pain.  Patient states that he has had intermittent diffuse pain with radiation into his chest with associated shortness of breath, nausea, vomiting.  Patient notes that he has had multiple bowel movements of black tarry stools.  Hemoccult testing was positive enlarged apartment.  Patient also notes episodes of emesis which were blood-tinged.  Patient was admitted to the observation unit for further monitoring and GI consultation.  She was seen and evaluated once on the observation unit floor.  He appeared uncomfortable he kept walking around the unit with his IV pole stating that he would get more nauseous and the pain would increase when he would lay down on his bed.  I was able to evaluate the patient and performed physical exam as he tolerated that for short time.  X-ray abdominal series imaging no acute chest pathology, with no acute abdominal pathology, no indications of small bowel obstruction or free air visualized    Location/Symptom: Generalized abdominal pain  Timing/Onset: One-day history  Provocation: Lying down  Quality: Crampy  Radiation: Radiation into his chest  Severity:  Moderate  Timing/Duration: Constant since onset    Modifying Factors: Pain and nausea less than with ambulation    REVIEW OF SYSTEMS       General ROS - No fevers, No malaise  Ophthalmic ROS - No discharge, No changes in vision  ENT ROS -  No sore throat, No rhinorrhea,   Respiratory ROS - Positive shortness of breath, no cough, no  wheezing  Cardiovascular ROS - positive chest pain, no dyspnea on exertion  Gastrointestinal ROS - positive generalized abdominal pain, positive nausea or vomiting positive for blood-tinged emesis, multiple episodes of black/tarry stools no evidence of frank red blood  Genito-Urinary ROS - No dysuria, trouble voiding, or hematuria  Musculoskeletal ROS - No myalgias, No arthalgias  Neurological ROS - No headache, no dizziness/lightheadedness, No focal weakness, no loss of sensation  Dermatological ROS - No lesions, No rash     (PQRS) Advance directives on face sheet per hospital policy. No change unless specifically mentioned in chart    PAST MEDICAL HISTORY    has a past medical history of Back pain and Headache.    I have reviewed the past medical history with the patient and it isNot pertinent to this complaint.      SURGICAL HISTORY      has no past surgical history on file.  I have reviewed and agree  with Surgical History entered    CURRENT MEDICATIONS       fluticasone (FLONASE) 50 MCG/ACT nasal spray 2 spray Daily   gabapentin (NEURONTIN) capsule 400 mg TID   methadone 5 MG/5ML solution 45 mg Daily   sodium chloride flush 0.9 % injection 10 mL 2 times per day   sodium chloride flush 0.9 % injection 10 mL PRN   acetaminophen (TYLENOL) tablet 650 mg Q4H PRN   0.9 % sodium chloride infusion Continuous   ondansetron (ZOFRAN) injection 4 mg Q8H PRN   cetirizine (ZYRTEC) tablet 10 mg Daily       All medication charted and reviewed.    ALLERGIES     is allergic to codeine and nubain [nalbuphine hcl].      FAMILY HISTORY     indicated that the status of his other is unknown.    family  history includes Cancer (age of onset: 12) in an other family member.  The patient denies any pertinent family history.  I have reviewed and agree with the family history entered.  I have reviewed the Family History and it is not significant to the case    SOCIAL HISTORY      reports that he quit smoking about 3 years ago. He does not have any smokeless tobacco history on file. He reports that he drinks alcohol. He reports that he uses illicit drugs, including Other-see comments, IV, and Opiates .  I have reviewed and agree with all Social.  There are no concerns for substance abuse/use.    PHYSICAL EXAM     INITIAL VITALS:  height is 5\' 8"  (1.727 m) and weight is 230 lb (104.3 kg). His oral temperature is 97.7 ??F (36.5 ??C). His blood pressure is 130/88 and his pulse is 77. His respiration is 20 and oxygen saturation is 97%.      CONSTITUTIONAL: AOx4, Mild distress, appears stated age    HEAD: normocephalic, atraumatic   EYES: PERRLA, EOMI    ENT: moist mucous membranes, uvula midline   NECK: supple, symmetric   BACK: symmetric   LUNGS: clear to auscultation bilaterally   CARDIOVASCULAR: regular rate and rhythm, no murmurs, rubs or gallops   ABDOMEN: soft, Mild generalized abdominal pain to palpation, non-distended with normal active bowel sounds   NEUROLOGIC:  MAEx4, no focal sensory or motor deficits   MUSCULOSKELETAL: no clubbing, cyanosis or edema   SKIN: no rash or wounds       DIFFERENTIAL DIAGNOSIS/MDM:     Abdominal Pain:  DDX: GERD, PUD, pancreatitis, cholecystitis, GB colic, cholangitis, Fitz-Hugh-Curtis, ACS/ MI, pneumonia, SBO, DKA, AAA, mesenteric ischemia, perforated viscous, acute gastroenteritis, NSAP, pyelonephritis, kidney stone, appendicitis, hernia, D-TICS, testicular torsion, ectopic,UTI, constipation, epididymitis/ orchitis    Vomiting:  DDX: SBO, DKA, Abdominal (gastroenteritis, appendicitis, gallbladder, pancreatitis, PUD, perforation), ICH, meningitis, vertigo, hyperemesis, abnormal lytes,  EtOH intoxication/ tox, post-tussive, ACS/ MI.          DIAGNOSTIC RESULTS     EKG: All EKG's are interpreted by the Observation Physician who either signs or Co-signs this chart in the absence of a cardiologist.        RADIOLOGY:   I directly visualized the following  images and reviewed the radiologist interpretations:    Xr Acute Abd Series Chest 1 Vw    Result Date: 03/23/2016  EXAMINATION: ACUTE ABDOMINAL SERIES 03/23/2016 6:12 am COMPARISON: Dec 15, 2015 HISTORY: ORDERING SYSTEM PROVIDED HISTORY: abd pain TECHNOLOGIST PROVIDED HISTORY: Reason for exam:->abd pain FINDINGS: Lungs are clear  without acute process. No pleural effusion or pneumothorax. No focal consolidation or edema. Cardiomediastinal silhouette and bony thorax are without acute abnormality. No evidence of intraperitoneal free air. Nonspecific bowel gas pattern without evidence of obstruction. No abnormal calcifications. Osseous structures are intact.  Moderate retained fecal material.     No acute process in the chest. No bowel obstruction or free air.       LABS:  I have reviewed and interpreted all available lab results.  Labs Reviewed   BASIC METABOLIC PANEL - Abnormal; Notable for the following:        Result Value    Glucose 102 (*)     All other components within normal limits   HEPATIC FUNCTION PANEL - Abnormal; Notable for the following:     ALT 47 (*)     Total Bilirubin 0.26 (*)     All other components within normal limits   CBC WITH AUTO DIFFERENTIAL   LIPASE           SCREENING TOOLS:        CDU IMPRESSION / PLAN      David [name] is a 30 y.o. male who presents with     1.  Acute on chronicGeneralized abdominal pain with associated nausea, vomiting, hematemesis with fecal occult positive stools.  Due to unknown etiology  -XR abdominal series no acute abdominal pathology, no evidence of small bowel obstruction, free air  -Will obtain GI consultation: Awaiting recommendations   -IV fluids 125 mL an hour  -Antiemetics  -Tylenol.  Patient is  on methadone.  -Patient currently nothing by mouth awaiting GI recommendations and evaluation  -Patient received a dose of IV Pepcid    ?? IP CONSULT TO GI  ?? Further workup and evaluation   ?? Follow up recommendations     ?? Continue home meds, pain control   ?? Monitor vitals, labs, imaging     DISPO: pending consults and clinical improvement     CONSULTS:    IP CONSULT TO GI    PROCEDURES:  Not indicated       PATIENT REFERRED TO:    No follow-up provider specified.    --  Newton Pigganiel Zakyra [place], DO   Emergency Medicine Resident     This dictation was generated by voice recognition computer software.  Although all attempts are made to edit the dictation for accuracy, there may be errors in the transcription that are not intended.

## 2016-03-23 NOTE — Discharge Summary (Signed)
CDU Discharge Summary        Patient:  David [name]  Date of Birth: 05/16/1986    MRN: [id number]   Acct: [id number]    Primary Care Physician: No primary care provider on file.    Admit date:  03/23/2016 03/23/2016  4:01 AM  Discharge date:  03/23/2016 03/23/2016  7:45 PM     Discharge Diagnoses:   Acute on chronic generalized abdominal pain with associated nausea, vomiting, hematemesis, fecal occult positive stools.  Barretts Esophagus discovered on EGD.  Patient treated with GI consultation and further assessed with endoscopy.  Treated with IV fluids, antiemetics.  Patient requiring follow-up with GI on outpatient basis for biopsy results and monitoring.       Discharge Medications:       David [name], David [name]   Home Medication Instructions AVW:[id number]    Printed on:03/23/16 2002   Medication Information                      famotidine (PEPCID) 20 MG tablet  Take 1 tablet by mouth 2 times daily             fexofenadine (ALLEGRA) 180 MG tablet  Take 1 tablet by mouth daily             fluticasone (FLONASE) 50 MCG/ACT nasal spray  2 sprays by Nasal route daily             gabapentin (NEURONTIN) 400 MG capsule  Take 1 capsule by mouth 3 times daily             gabapentin (NEURONTIN) 400 MG capsule  Take 1 capsule by mouth 3 times daily             methadone 10 MG/5ML solution  Take 45 mg by mouth daily  .             pantoprazole (PROTONIX) 40 MG tablet  Take 1 tablet by mouth daily             promethazine (PHENERGAN) 25 MG tablet  Take 1 tablet by mouth every 6 hours as needed for Nausea                 Diet:  DIET GENERAL; , upon discharge advance diet as tolerated    Activity:  As tolerated    Follow-up:  Call today/tomorrow for a follow up appointment with No primary care provider on file.     Consultants: IP CONSULT TO GI    Procedures:  EGD    Diagnostic Test: Xr Acute Abd Series Chest 1 Vw    Result Date: 03/23/2016  EXAMINATION: ACUTE ABDOMINAL SERIES 03/23/2016 6:12 am COMPARISON: Dec 15, 2015 HISTORY: ORDERING SYSTEM  PROVIDED HISTORY: abd pain TECHNOLOGIST PROVIDED HISTORY: Reason for exam:->abd pain FINDINGS: Lungs are clear without acute process. No pleural effusion or pneumothorax. No focal consolidation or edema. Cardiomediastinal silhouette and bony thorax are without acute abnormality. No evidence of intraperitoneal free air. Nonspecific bowel gas pattern without evidence of obstruction. No abnormal calcifications. Osseous structures are intact.  Moderate retained fecal material.     No acute process in the chest. No bowel obstruction or free air.           Physical Exam:    General appearance - NAD, AOx 3  Lungs -CTA Bilateraly  Heart - Normal S1/S2  Abdomen - abdomen soft mild diffuse tenderness normoactive bowel sounds  Neurological:  No focal motor or sensory weakness.  Extremities - Cap refil <2 sec in all ext.  Skin -warm, dry      Hospital Course:   David [name] originally presented to the hospital on 03/23/2016  4:01 AM patient with a history of chronic abdominal pain and nausea.  Patient presents with one-day history of severe diffuse abdominal pain with associated nausea, vomiting, hematemesis with multiple episodes of black tarry stools.  Patient was admitted to observation unit for further monitoring and GI evaluation.  GI to patient to endoscopy and 1-2 cm of salmon colored mucosa consistent with Barretts, Biopsies were taken. GI recommending PPI BID for 8 weeks then Daily. Labs and imaging were followed daily.  At time of discharge, David [name] was tolerating a PO intake well, passing flatus, urinating adequately, ambulating and had adequate analgesia on oral pain medications.  David [name] is medically stable to be discharged. Clinical course has improved. I feel the patient can be safely discharged to home with outpatient follow up.  Instructions have been given for the patient to return to the ED for any worsening of the symptoms, including but not limited to increased pain, shortness of breath, weakness, or any  deterioration of their current condition .     Disposition: Home    Condition: Good      Patient stable and ready for discharge home. I have discussed plan of care with patient and they are in understanding. They were instructed to read discharge paperwork. All of their questions and concerns were addressed.     Patient states that they understand the plan and agree with the plan      Time Spent: 0        Newton Pigg, DO  Emergency Medicine Resident Physician

## 2016-03-23 NOTE — Procedures (Signed)
[facility]  EGD    Patient: David [name]            Date:   03/23/2016  DOB: 1985-10-01       Med Rec#: [id number]         PROCEDURE:  EGD with biopsy    INDICATION:Recurrent nausea and vomiting with abdominal pain    FINDINGS:   ESOPHAGUS: The distal esophagus was notable for 1-2 cm of salmon colored mucosa, consistent with Barretts. Biopsies were obtained for evaluation.    STOMACH: There were areas of erosions in the antrum and to a lesser extent in the body of the esophagus. Multiple biopsies were obtained and forwarded to pathology for evaluation of H. Pylori and routine pathology.    DUODENUM: The duodenum was within normal limits.    PLAN  F/u with biopsies report.  PPI BID for 8 weeks then daily    F/u as op.        MEDICATIONS:  MAC   COMPLICATIONS:None.  Prior to the procedure, the patient's history was reviewed and a directed physical examination was performed.   Informed consent was obtained.  After adequate sedation was achieved, the scope was inserted into the mouth and advanced to the thrid portion of the duodenum.  As the scope was withdrawn, the anatomy and the appearance of the mucosa was noted.       Electronically signed by Zelphia Cairo, MD on 03/23/2016 at 4:34 PM
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
## 2016-03-23 NOTE — ED Provider Notes (Signed)
David [name] [facility] MED SURG  Emergency Department Encounter  Emergency Medicine Resident     Pt Name: David [name]  MRN: [id number]  Birthdate Apr 12, 1986  Date of evaluation: 03/23/16  PCP:  No primary care provider on file.    CHIEF COMPLAINT       Chief Complaint   Patient presents with   ??? Abdominal Pain     x2 hours, states pressure   ??? Other     c/o rectal pain, black stool earlier today   ??? Chest Pain     c/o chest tightness       HISTORY OF PRESENT ILLNESS  (Location/Symptom, Timing/Onset, Context/Setting, Quality, Duration, Modifying Factors, Severity.)      David [name] is a 30 y.o. male who presents with Complaints of abdominal pain more so in the midepigastric area with radiation up in the esophagus is burning sensation with excessive burping.  Patient admits to nausea and vomiting, abdominal pain, dark stools.  Patient states that he has been evaluated here multiple times for similar complaints and was discharged home with antiemetics each time.  Patient states that he was scheduled to get an outpatient EGD done by GI but he was unable to due to insurance issues.  Patient denies any chest pain, shortness of breath, fevers or chills, or any GU symptoms including no testicular pain on exam.    PAST MEDICAL / SURGICAL / SOCIAL / FAMILY HISTORY      has a past medical history of Back pain and Headache.     has no past surgical history on file.    Social History     Social History   ??? Marital status: Single     Spouse name: N/A   ??? Number of children: N/A   ??? Years of education: N/A     Occupational History   ??? Not on file.     Social History Main Topics   ??? Smoking status: Former Smoker     Quit date: 09/16/2012   ??? Smokeless tobacco: Not on file   ??? Alcohol use 0.0 oz/week     0 Standard drinks or equivalent per week   ??? Drug use: Yes     Special: Other-see comments, IV, Opiates       Comment: xanax   ??? Sexual activity: Yes     Partners: Female     Other Topics Concern   ??? Not on file     Social History Narrative        Family History   Problem Relation Age of Onset   ??? Cancer Other 90     possible colon cancer       Allergies:  Codeine and Nubain [nalbuphine hcl]    Home Medications:  Prior to Admission medications    Medication Sig Start Date End Date Taking? Authorizing Provider   famotidine (PEPCID) 20 MG tablet Take 1 tablet by mouth 2 times daily 12/29/15   Anette Guarneri, DO   pantoprazole (PROTONIX) 40 MG tablet Take 1 tablet by mouth daily 12/15/15   Wendall Mola, DO   gabapentin (NEURONTIN) 400 MG capsule Take 1 capsule by mouth 3 times daily 12/15/15   Wendall Mola, DO   pantoprazole (PROTONIX) 20 MG tablet Take 1 tablet by mouth daily 11/24/15   Warrick Parisian, DO   ibuprofen (ADVIL;MOTRIN) 800 MG tablet Take 1 tablet by mouth every 8 hours as needed for Pain 08/29/14   Cherylann Ratel, MD  fexofenadine (ALLEGRA) 180 MG tablet Take 1 tablet by mouth daily 08/29/14   Cherylann Ratel, MD   fluticasone (FLONASE) 50 MCG/ACT nasal spray 2 sprays by Nasal route daily 08/29/14   Cherylann Ratel, MD   methadone 10 MG/5ML solution Take 110 mg by mouth daily  .    Historical Provider, MD       REVIEW OF SYSTEMS    (2-9 systems for level 4, 10 or more for level 5)      Review of Systems   Constitutional: Negative for chills and fever.   HENT: Negative for congestion, rhinorrhea and sore throat.    Eyes: Negative for discharge and redness.   Respiratory: Negative for cough, chest tightness and shortness of breath.    Cardiovascular: Negative for chest pain.   Gastrointestinal: Positive for abdominal pain, blood in stool, nausea and vomiting. Negative for diarrhea.   Endocrine: Negative for polydipsia and polyuria.   Genitourinary: Negative for dysuria and hematuria.   Musculoskeletal: Negative for neck pain and neck stiffness.   Skin: Negative for rash and wound.   Allergic/Immunologic: Negative for immunocompromised state.   Neurological: Negative for weakness, numbness and headaches.   Hematological: Negative for  adenopathy.   Psychiatric/Behavioral: Negative for agitation and behavioral problems.       PHYSICAL EXAM   (up to 7 for level 4, 8 or more for level 5)      INITIAL VITALS:   BP 130/88   Pulse 77   Temp 97.7 ??F (36.5 ??C) (Oral)    Resp 20   Ht 5\' 8"  (1.727 m)   Wt 230 lb (104.3 kg)   SpO2 97%   BMI 34.97 kg/m2    Physical Exam   Constitutional: He is oriented to person, place, and time. He appears well-developed and well-nourished. He appears distressed.   Discomfort due to pain in the abdomen   HENT:   Head: Normocephalic and atraumatic.   Eyes: Conjunctivae and EOM are normal. Pupils are equal, round, and reactive to light.   Neck: Normal range of motion. Neck supple. No JVD present. No tracheal deviation present.   Cardiovascular: Normal rate, regular rhythm, normal heart sounds and intact distal pulses.    Pulmonary/Chest: Effort normal and breath sounds normal. No stridor. No respiratory distress. He has no wheezes. He has no rales. He exhibits no tenderness.   Abdominal:   Abdomen soft, nondistended, no peritoneal, no rebound tenderness or guarding, normal bowel sounds, slight tenderness to palpation midepigastric area   Musculoskeletal: Normal range of motion. He exhibits no edema.   Neurological: He is alert and oriented to person, place, and time. No cranial nerve deficit.   Skin: Skin is warm and dry. No rash noted.   Psychiatric: He has a normal mood and affect.       DIFFERENTIAL  DIAGNOSIS     PLAN (LABS / IMAGING / EKG):  Orders Placed This Encounter   Procedures   ??? XR Acute Abd Series Chest 1 VW   ??? CBC Auto Differential   ??? BASIC METABOLIC PANEL   ??? HEPATIC FUNCTION PANEL   ??? LIPASE   ??? Diet NPO, After Midnight   ??? Vital signs per unit routine   ??? Notify physician   ??? Notify physician   ??? Place intermittent pneumatic compression device   ??? Full Code   ??? Inpatient consult to GI   ??? PATIENT STATUS (FROM ED OR OR/PROCEDURAL) Observation       MEDICATIONS ORDERED:  Orders Placed This Encounter    Medications   ??? DISCONTD: promethazine (PHENERGAN) injection 25 mg   ??? 0.9 % sodium chloride bolus   ??? famotidine (PEPCID) injection 20 mg   ??? promethazine (PHENERGAN) injection 25 mg   ??? DISCONTD: fexofenadine (ALLEGRA) tablet 180 mg     Order Specific Question:   Please select a reason the therapeutic interchange was not accepted:     Answer:   Okay for Pharmacy to Substitute   ??? fluticasone (FLONASE) 50 MCG/ACT nasal spray 2 spray   ??? gabapentin (NEURONTIN) capsule 400 mg   ??? methadone 10 MG/5ML solution 110 mg     Verify dosage with pharmacy prior to administration   ??? sodium chloride flush 0.9 % injection 10 mL   ??? sodium chloride flush 0.9 % injection 10 mL   ??? acetaminophen (TYLENOL) tablet 650 mg   ??? 0.9 % sodium chloride infusion   ??? ondansetron (ZOFRAN) injection 4 mg   ??? dicyclomine (BENTYL) injection 20 mg   ??? ondansetron (ZOFRAN) injection 4 mg   ??? cetirizine (ZYRTEC) tablet 10 mg       DDX: Gastric ulcer, GERD, pancreatitis    DIAGNOSTIC RESULTS / EMERGENCY DEPARTMENT COURSE / MDM     LABS:  Results for orders placed or performed during the hospital encounter of 03/23/16   CBC Auto Differential   Result Value Ref Range    WBC 9.2 3.5 - 11.0 k/uL    RBC 5.38 4.5 - 5.9 m/uL    Hemoglobin 15.6 13.5 - 17.5 g/dL    Hematocrit 09.846.5 41 - 53 %    MCV 86.3 80 - 100 fL    MCH 28.9 26 - 34 pg    MCHC 33.5 31 - 37 g/dL    RDW 11.913.3 14.712.5 - 82.915.4 %    Platelets 225 140 - 450 k/uL    MPV 8.2 6.0 - 12.0 fL    Differential Type NOT REPORTED     WBC Morphology NOT REPORTED     RBC Morphology NOT REPORTED     Platelet Estimate NOT REPORTED     Seg Neutrophils 76 %    Lymphocytes 18 %    Monocytes 5 %    Eosinophils % 1 %    Basophils 0 %    Segs Absolute 7.00 1.8 - 7.7 k/uL    Absolute Lymph # 1.60 1.0 - 4.8 k/uL    Absolute Mono # 0.50 0.1 - 1.2 k/uL    Absolute Eos # 0.10 0.0 - 0.4 k/uL    Basophils # 0.00 0.0 - 0.2 k/uL   BASIC METABOLIC PANEL   Result Value Ref Range    Glucose 102 (H) 70 - 99 mg/dL    BUN 11 6 - 20  mg/dL    CREATININE 5.620.88 1.300.70 - 1.20 mg/dL    Bun/Cre Ratio NOT REPORTED 9 - 20    Calcium 9.7 8.6 - 10.4 mg/dL    Sodium 865143 784135 - 696144 mmol/L    Potassium 4.3 3.7 - 5.3 mmol/L    Chloride 102 98 - 107 mmol/L    CO2 27 20 - 31 mmol/L    Anion Gap 14 9 - 17 mmol/L    GFR Non-African American >60 >60 mL/min    GFR African American >60 >60 mL/min    GFR Comment          GFR Staging NOT REPORTED    HEPATIC FUNCTION PANEL   Result Value Ref  Range    Alb 4.6 3.5 - 5.2 g/dL    Alkaline Phosphatase 88 40 - 129 U/L    ALT 47 (H) 5 - 41 U/L    AST 35 <40 U/L    Total Bilirubin 0.26 (L) 0.3 - 1.2 mg/dL    Bilirubin, Direct <1.61 <0.31 mg/dL    Bilirubin, Indirect CANNOT BE CALCULATED 0.00 - 1.00 mg/dL    Total Protein 7.6 6.4 - 8.3 g/dL    Globulin NOT REPORTED 1.5 - 3.8 g/dL    Albumin/Globulin Ratio 1.5 1.0 - 2.5   LIPASE   Result Value Ref Range    Lipase 27 13 - 60 U/L       IMPRESSION/EMERGENCY DEPARTMENT COURSE:    Less obtained as well.  Workup including abdominal labs and administer antiemetics and analgesia and reevaluate.    Laboratory workup unremarkable.  Due to multiple evaluations and no follow-through for seen GI we'll admit to observation for GI consult for possible EGD.    Awaiting transfer to the floor.  Patient care taken over by Dr. Ginnie Smart    ED Course       RADIOLOGY:  Pending imaging    PROCEDURES:  None    CONSULTS:  IP CONSULT TO GI    CRITICAL CARE:  None    FINAL IMPRESSION      1. Epigastric pain    2. Non-intractable vomiting with nausea, unspecified vomiting type    3. Hematemesis with nausea          DISPOSITION / PLAN     DISPOSITION     PATIENT REFERRED TO:  No follow-up provider specified.    DISCHARGE MEDICATIONS:  Current Discharge Medication List          Lanette Hampshire, DO  Emergency Medicine Resident    (Please note that portions of this note were completed with a voice recognition program.  Efforts were made to edit the dictations but occasionally words are mis-transcribed.)     Lanette Hampshire, DO  03/23/16 0960    Attending Addendum:    Note reviewed and I agree with resident/APP documenation. Changes to chart made as appropriate.    Lanice Schwab, MD  Attending Emergency Physician  10:04 AM 03/23/2016       Lanice Schwab, MD  03/23/16 1004

## 2016-03-23 NOTE — Anesthesia Post-Procedure Evaluation (Signed)
Department of Anesthesiology  Postprocedure Note    Patient: Lesly DukesDerek D Garron  MRN: [id number]  Birthdate: 02/17/1986  Date of evaluation: 03/23/2016  Time:  5:25 PM     Procedure Summary     Date Anesthesia Start Anesthesia Stop Room / Location    03/23/16 1605 1640 [facility] / [facility]       Procedure Diagnosis Surgeon Responsible Provider    EGD BIOPSY (N/A ) (GI BLEED ABD PAIN) Zelphia CairoLeroy Odom, MD Laroy Appleracy J Kadan Millstein, CRNA          Anesthesia Type: MAC, TIVA    Aldrete Phase I:      Aldrete Phase II: Aldrete Score: 10    Last vitals:  BP 112/70 (03/23/16 1700)    Temp 36.7 ??C (98 ??F) (03/23/16 1630)    Pulse 93 (03/23/16 1700)   Resp 21 (03/23/16 1700)    SpO2 95 % (03/23/16 1700)      Anesthesia Post Evaluation    Patient location during evaluation: PACU  Patient participation: complete - patient participated  Level of consciousness: awake  Pain score: 1  Airway patency: patent  Nausea & Vomiting: no nausea and no vomiting  Complications: no  Respiratory status: acceptable  Hydration status: euvolemic
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
## 2016-03-23 NOTE — Discharge Instructions (Signed)
Gastritis: Care Instructions  Your Care Instructions    Gastritis is a sore and upset stomach. It happens when something irritates the stomach lining. Many things can cause it. These include an infection such as the flu or something you ate or drank. Medicines or a sore on the lining of the stomach (ulcer) also can cause it. Your belly may bloat and ache. You may belch, vomit, and feel sick to your stomach.  You should be able to relieve the problem by taking medicine. And it may help to change your diet. If gastritis lasts, your doctor may prescribe medicine.  Follow-up care is a key part of your treatment and safety. Be sure to make and go to all appointments, and call your doctor if you are having problems. It's also a good idea to know your test results and keep a list of the medicines you take.  How can you care for yourself at home?   If your doctor prescribed antibiotics, take them as directed. Do not stop taking them just because you feel better. You need to take the full course of antibiotics.   Be safe with medicines. If your doctor prescribed medicine to decrease stomach acid, take it as directed. Call your doctor if you think you are having a problem with your medicine.   Do not take any other medicine, including over-the-counter pain relievers, without talking to your doctor first.   If your doctor recommends over-the-counter medicine to reduce stomach acid, such as Pepcid AC, Prilosec, Tagamet HB, or Zantac 75, follow the directions on the label.   Drink plenty of fluids (enough so that your urine is light yellow or clear like water) to prevent dehydration. Choose water and other caffeine-free clear liquids. If you have kidney, heart, or liver disease and have to limit fluids, talk with your doctor before you increase the amount of fluids you drink.   Limit how much alcohol you drink.   Avoid coffee, tea, cola drinks, chocolate, and other foods with caffeine. They increase stomach  acid.  When should you call for help?  Call 911 anytime you think you may need emergency care. For example, call if:   You vomit blood or what looks like coffee grounds.   You pass maroon or very bloody stools.  Call your doctor now or seek immediate medical care if:   You start breathing fast and have not produced urine in the last 8 hours.   You cannot keep fluids down.  Watch closely for changes in your health, and be sure to contact your doctor if:   You do not get better as expected.  Where can you learn more?  Go to [email or web address] and sign in to your MyChart account. Enter Z536 in the Search Health Information box to learn more about "Gastritis: Care Instructions."     If you do not have an account, please click on the "Sign Up Now" link.  Current as of: February 25, 2015  Content Version: 11.2   2006-2017 [employer]. Care instructions adapted under license by [facility]. If you have questions about a medical condition or this instruction, always ask your healthcare professional. [employer], [employer] disclaims any warranty or liability for your use of this information.

## 2016-03-25 LAB — SURGICAL PATHOLOGY

## 2016-04-05 ENCOUNTER — Encounter: Primary: Family

## 2016-04-27 NOTE — Telephone Encounter (Signed)
Please advise if patient can be scheduled at this office as a new patient. No show for Dr. Arthor CaptainArti and previously scheduled with Kristine GarbeShelly Turski also. Thank you

## 2016-04-30 NOTE — Telephone Encounter (Signed)
Unfortunately, when a pt no shows at our office for his first visit. HX of n/s's.  I will forward this to the provider.    Pt's girl friend comes to our office.  [name],David L.    ARE WE ALLOWED to r/s for this new pt?

## 2016-04-30 NOTE — Telephone Encounter (Signed)
The pt will be notified.  Lmr, for the pt.

## 2016-04-30 NOTE — Telephone Encounter (Signed)
Per office policy we do not reschedule a New Patient that does not show.  Also looks like he was looking to get rx for Neurotin.

## 2016-05-11 ENCOUNTER — Ambulatory Visit (INDEPENDENT_AMBULATORY_CARE_PROVIDER_SITE_OTHER): Payer: Self-pay | Admitting: Sports Medicine

## 2016-05-11 DIAGNOSIS — M7121 Synovial cyst of popliteal space [Baker], right knee: Secondary | ICD-10-CM

## 2016-05-11 NOTE — Assessment & Plan Note (Signed)
Aspiration and injection as above. Return as needed. 

## 2016-05-11 NOTE — Progress Notes (Signed)
  Subjective:    CC: Right knee pain  HPI: This is a pleasant 30 year old male, last year we aspirated and injected a Baker's cyst, it did well until recently, he is having recurrence, mild, persistent, localized at the musculotendinous junction of the gastrocnemius and the semitendinosus. He does desire repeat interventional treatment today.  Past medical history:  Negative.  See flowsheet/record as well for more information.  Surgical history: Negative.  See flowsheet/record as well for more information.  Family history: Negative.  See flowsheet/record as well for more information.  Social history: Negative.  See flowsheet/record as well for more information.  Allergies, and medications have been entered into the medical record, reviewed, and no changes needed.   Review of Systems: No fevers, chills, night sweats, weight loss, chest pain, or shortness of breath.   Objective:    General: Well Developed, well nourished, and in no acute distress.  Neuro: Alert and oriented x3, extra-ocular muscles intact, sensation grossly intact.  HEENT: Normocephalic, atraumatic, pupils equal round reactive to light, neck supple, no masses, no lymphadenopathy, thyroid nonpalpable.  Skin: Warm and dry, no rashes. Cardiac: Regular rate and rhythm, no murmurs rubs or gallops, no lower extremity edema.  Respiratory: Clear to auscultation bilaterally. Not using accessory muscles, speaking in full sentences. Right Knee: Normal to inspection with no erythema or effusion or obvious bony abnormalities. Baker's cyst cyst palpable ROM normal in flexion and extension and lower leg rotation. Ligaments with solid consistent endpoints including ACL, PCL, LCL, MCL. Negative Mcmurray's and provocative meniscal tests. Non painful patellar compression. Patellar and quadriceps tendons unremarkable. Hamstring and quadriceps strength is normal.  Procedure: Real-time Ultrasound Guided aspiration/Injection of right Baker's  cyst Device: GE Logiq E  Verbal informed consent obtained.  Time-out conducted.  Noted no overlying erythema, induration, or other signs of local infection.  Skin prepped in a sterile fashion.  Local anesthesia: Topical Ethyl chloride.  With sterile technique and under real time ultrasound guidance:  Using 18-gauge needle aspirated about 5 mL of very thick fluid, syringe switched and 1 mL kenalog 40 injected easily. Completed without difficulty  Pain immediately resolved suggesting accurate placement of the medication.  Advised to call if fevers/chills, erythema, induration, drainage, or persistent bleeding.  Images permanently stored and available for review in the ultrasound unit.  Impression: Technically successful ultrasound guided injection.  The knee was then strapped with compressive dressing.  Impression and Recommendations:    Baker's cyst of right knee Aspiration and injection as above. Return as needed.

## 2016-08-02 ENCOUNTER — Encounter: Attending: Gastroenterology | Primary: Family

## 2016-09-15 NOTE — Telephone Encounter (Signed)
Spoke to patient and explained need to reschedule appointment on September 27, 2016 @ !0 a.m. To Nov 29, 2016 @ 9:10 a.m. Due to provider not in clinic on September 27 2016. Clients wife requested a note faxed [facility] to reflect change in appointment  Fax number [phone] client and wife verbalized understanding--written letter to be sent out by Clinical research associatewriter

## 2016-09-27 ENCOUNTER — Encounter: Attending: Gastroenterology | Primary: Family

## 2016-11-29 ENCOUNTER — Ambulatory Visit: Payer: PRIVATE HEALTH INSURANCE | Attending: Gastroenterology | Primary: Family

## 2016-12-27 ENCOUNTER — Encounter: Attending: Gastroenterology | Primary: Family

## 2017-02-21 ENCOUNTER — Ambulatory Visit
Admit: 2017-02-21 | Discharge: 2017-02-21 | Payer: PRIVATE HEALTH INSURANCE | Attending: Internal Medicine | Primary: Family

## 2017-02-21 DIAGNOSIS — G8929 Other chronic pain: Secondary | ICD-10-CM

## 2017-02-21 MED ORDER — PANTOPRAZOLE SODIUM 40 MG PO TBEC
40 | ORAL_TABLET | Freq: Every day | ORAL | 0 refills | Status: DC
Start: 2017-02-21 — End: 2017-03-16

## 2017-02-21 MED ORDER — FLUTICASONE PROPIONATE 50 MCG/ACT NA SUSP
50 | Freq: Every day | NASAL | 3 refills | Status: DC
Start: 2017-02-21 — End: 2017-11-08

## 2017-02-21 MED ORDER — GABAPENTIN 800 MG PO TABS
800 MG | ORAL_TABLET | Freq: Three times a day (TID) | ORAL | 5 refills | Status: DC
Start: 2017-02-21 — End: 2017-05-27
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
## 2017-03-16 MED ORDER — PANTOPRAZOLE SODIUM 40 MG PO TBEC
40 MG | ORAL_TABLET | Freq: Every day | ORAL | 3 refills | Status: DC
Start: 2017-03-16 — End: 2017-08-11

## 2017-03-16 NOTE — Telephone Encounter (Signed)
Next Visit Date:  Future Appointments  Date Time Provider Department Center   05/24/2017 9:00 AM Griffin Basilheodore J Ware, MD [facility]       Health Maintenance   Topic Date Due   ??? DTaP/Tdap/Td vaccine (1 - Tdap) 09/07/2018 (Originally 09/23/2003)   ??? Flu vaccine (1) 03/19/2017   ??? HIV screen  Completed       No results found for: LABA1C          ( goal A1C is < 7)   No results found for: LABMICR  LDL Cholesterol (mg/dL)   Date Value   40/10/272508/11/2011 94       (goal LDL is <100)   AST (U/L)   Date Value   03/23/2016 35     ALT (U/L)   Date Value   03/23/2016 47 (H)     BUN (mg/dL)   Date Value   36/64/403409/11/2015 11     BP Readings from Last 3 Encounters:   02/21/17 132/88   03/23/16 134/83   03/23/16 (!) 112/56          (goal 120/80)    All Future Testing planned in [facility]  Lab Frequency Next Occurrence   Comprehensive Metabolic Panel Once 02/21/2017   CBC Auto Differential Once 02/21/2017   Lipid Panel Once 02/21/2017               Patient Active Problem List:     Fatigue     Constipation     Chronic sinusitis     Migraine     Chronic bilateral low back pain without sciatica     Postnasal drip     Anxiety     Neck pain     Allergic rhinitis     Obesity (BMI 30-39.9)     Pneumonia of right lung due to infectious organism     Opioid dependence in remission [facility](HCC)     Lower abdominal pain     GI bleed     Abdominal pain, LUQ     Functional diarrhea     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     Well adult exam

## 2017-05-24 ENCOUNTER — Encounter: Attending: Internal Medicine | Primary: Family

## 2017-05-26 NOTE — Telephone Encounter (Signed)
Patient wife called stating her husband would like to decrease his gabapentin to 600 mg because 800mg  makes him tired please review

## 2017-05-27 MED ORDER — GABAPENTIN 600 MG PO TABS
600 | ORAL_TABLET | Freq: Three times a day (TID) | ORAL | 5 refills | Status: DC
Start: 2017-05-27 — End: 2017-09-09

## 2017-07-18 NOTE — Telephone Encounter (Signed)
Pt is asking for an increase in gabapentin to 800 MG. He has not been seen since August of 2018. I did inform pt he would need to be seen by a different provider in the office but he insisted I ask you first. Please advise.

## 2017-07-21 NOTE — Telephone Encounter (Signed)
Needs new provider visit.

## 2017-07-21 NOTE — Telephone Encounter (Signed)
PC to pt, they state they will just come in for a walk-in appt on Monday as they are unsure of when they will be free specifically and then will set up with new provider after that appointment.

## 2017-07-29 ENCOUNTER — Encounter: Attending: Family | Primary: Family

## 2017-08-04 ENCOUNTER — Encounter: Attending: Family | Primary: Family

## 2017-08-11 ENCOUNTER — Ambulatory Visit: Admit: 2017-08-11 | Discharge: 2017-08-11 | Payer: PRIVATE HEALTH INSURANCE | Attending: Family | Primary: Family

## 2017-08-11 DIAGNOSIS — G8929 Other chronic pain: Secondary | ICD-10-CM

## 2017-08-11 MED ORDER — PROMETHAZINE HCL 12.5 MG PO TABS
12.5 MG | ORAL_TABLET | Freq: Three times a day (TID) | ORAL | 0 refills | Status: AC | PRN
Start: 2017-08-11 — End: 2017-09-10

## 2017-08-11 MED ORDER — PANTOPRAZOLE SODIUM 40 MG PO TBEC
40 | ORAL_TABLET | Freq: Every day | ORAL | 3 refills | Status: DC
Start: 2017-08-11 — End: 2017-11-08

## 2017-08-11 NOTE — Telephone Encounter (Signed)
I sent in the nausea medicine that he requested. We also discussed during his visit that I cannot change the dosage of the gabapentin as he is taking the maximum recommended amount. Other treatment options were offered and discussed. I am happy to speak with him regarding any question he has further that I did not explain well.

## 2017-08-11 NOTE — Progress Notes (Signed)
[facility] PHYSICIANS  [address] [facility]  [address]  [address]  [place] [address]  Dept: [phone]  Dept Fax: [phone]    08/11/2017     David [name] (DOB:  December 29, 1985)is a 32 y.o. male, here for evaluation of the following medical concerns:   Chief Complaint   Patient presents with   . Medication Check     pt states that he wants to re evaluate his is gabapentin. pt states that he has been working more and he is having more problems with his back.    . Health Maintenance     pt declined flu vaccine .       Working and worried because pain is getting worse  Taking gabapentin three times a day      Back Pain   This is a chronic problem. The current episode started more than 1 year ago. The problem occurs constantly. The problem has been gradually worsening since onset. The pain is present in the lumbar spine. The quality of the pain is described as shooting. The pain radiates to the left thigh (sometimes, mostly stays in the back). The pain is worse during the day. The symptoms are aggravated by bending. Associated symptoms include numbness. Pertinent negatives include no abdominal pain, chest pain, fever, paresthesias or weight loss. He has tried heat, analgesics, NSAIDs and muscle relaxant (copper back brace) for the symptoms. The treatment provided mild relief.   Abdominal Pain   This is a chronic problem. The current episode started more than 1 month ago. The problem occurs intermittently. The problem has been gradually improving. The pain is located in the LUQ (has always been there, hx of gastric ulcer). Associated symptoms include nausea. Pertinent negatives include no constipation, fever, hematuria, vomiting or weight loss.     .    Review of Systems   Constitutional: Negative for appetite change, fever and weight loss.   Cardiovascular: Negative for chest pain.   Gastrointestinal: Positive for nausea. Negative for abdominal pain, blood in stool, constipation and  vomiting.   Genitourinary: Negative for difficulty urinating, flank pain and hematuria.   Musculoskeletal: Positive for back pain. Negative for gait problem, neck pain and neck stiffness.   Neurological: Positive for numbness. Negative for paresthesias.       Prior to Visit Medications    Medication Sig Taking? Authorizing Provider   pantoprazole (PROTONIX) 40 MG tablet Take 1 tablet by mouth daily Yes Curt Bears, APRN - CNP   gabapentin (NEURONTIN) 600 MG tablet Take 1 tablet by mouth 3 times daily.Gaynelle Adu, MD   fluticasone Aleda Grana) 50 MCG/ACT nasal spray 2 sprays by Nasal route daily Yes Griffin Basil, MD   methadone 10 MG/5ML solution Take 45 mg by mouth daily  . Yes Historical Provider, MD        Social History   Substance Use Topics   . Smoking status: Former Smoker     Packs/day: 1.00     Years: 5.00     Quit date: 09/16/2012   . Smokeless tobacco: Never Used   . Alcohol use 0.0 oz/week        Vitals:    08/11/17 0945   BP: 136/79   Site: Left Upper Arm   Position: Sitting   Cuff Size: Large Adult   Pulse: 77   Temp: 97.7 F (36.5 C)   TempSrc: Oral   SpO2: 97%   Weight: 237 lb (  107.5 kg)     Estimated body mass index is 36.04 kg/m as calculated from the following:    Height as of 02/21/17: 5\' 8"  (1.727 m).    Weight as of this encounter: 237 lb (107.5 kg).    Physical Exam   Constitutional: He is oriented to person, place, and time. He appears well-developed and well-nourished.   HENT:   Head: Normocephalic and atraumatic.   Neck: Neck supple.   Cardiovascular: Normal rate and regular rhythm.    Pulmonary/Chest: Effort normal and breath sounds normal.   Abdominal: Soft. Bowel sounds are normal. There is tenderness in the left upper quadrant.   Musculoskeletal: He exhibits tenderness. He exhibits no edema.        Lumbar back: He exhibits tenderness (left paraspinals only).        Back:    Neurological: He is oriented to person, place, and time.   Skin: Skin is warm and dry.   Psychiatric:  He has a normal mood and affect. His behavior is normal.       ASSESSMENT      ICD-10-CM    1. Chronic bilateral low back pain without sciatica M54.5 [facility] - [address] [place]    G89.29    2. Barrett's esophagus determined by endoscopy K22.70 pantoprazole (PROTONIX) 40 MG tablet     [place]- Daboul, Isam, MD, [facility] [place]         PLAN:  Orders Placed This Encounter   Medications   . pantoprazole (PROTONIX) 40 MG tablet     Sig: Take 1 tablet by mouth daily     Dispense:  30 tablet     Refill:  3         1. Barretts Esophagus discovered on EGD 03/23/16. Restart PPI and send for GI referral as pt states he is still having nausea, LUQ discomfort. No NSAIDS  2. Advised it is not recommended anymore to go above 600mg  TD with gabapentin. Pt declines TCA. Agreeable to course of PT, pain appears more muscular in nature based on exam.    Adverse drug reactions discussed with patient  Patient verbalized understanding of all instructions given.    No Follow-up on file.    An electronic signature was used to authenticate this note.    --Curt Bears, APRN - CNP on 08/11/2017 at 10:13 AM  Visit Information    Have you changed or started any medications since your last visit including any over-the-counter medicines, vitamins, or herbal medicines? yes - one a day multi vit   Are you having any side effects from any of your medications? -  yes - fatigue from the gabapentin  Have you stopped taking any of your medications? Is so, why? -  no    Have you seen any other physician or provider since your last visit? No  Have you had any other diagnostic tests since your last visit? Yes - Records Requested  Have you been seen in the emergency room and/or had an admission to a hospital since we last saw you? No  Have you had your routine dental cleaning in the past 6 months? no    Have you activated your MyChart account? If not, what are your barriers? Yes     Patient Care Team:  Curt Bears, APRN - CNP as PCP -  General (Family Medicine)  [facility]  Corky Mull, MD as Consulting Physician (Gastroenterology)    Medical History Review  Past Medical, Family, and  Social History reviewed and does contribute to the patient presenting condition    Health Maintenance   Topic Date Due   . Flu vaccine (1) 11/09/2017 (Originally 03/19/2017)   . DTaP/Tdap/Td vaccine (1 - Tdap) 09/07/2018 (Originally 09/23/2003)   . HIV screen  Completed

## 2017-08-11 NOTE — Addendum Note (Signed)
Addended by: Victorino DikeBROOKS, Wretha Laris on: 08/11/2017 01:57 PM     Modules accepted: Orders

## 2017-08-11 NOTE — Telephone Encounter (Signed)
Pts wife was informed of the phenergan being sent to the pharmacy but she is not understanding why the gabapentin dose will not be increased. She is requesting that you call her at [phone].

## 2017-08-11 NOTE — Telephone Encounter (Signed)
Pt's wife Ezequiel EssexJocelyn called in regarding his appt today. She states that he felt very "uncomfortable" during his visit and does not feel like all of his questions and concerns were addressed. He would like his gabapentin dose increased to 800mg  3x daily, which is what Dr. Waldo LaineWare had previously given him. She also states that his ulcer is making him nauseous so he would like a medication for nausea.       Health Maintenance   Topic Date Due   . Flu vaccine (1) 11/09/2017 (Originally 03/19/2017)   . DTaP/Tdap/Td vaccine (1 - Tdap) 09/07/2018 (Originally 09/23/2003)   . HIV screen  Completed             (applicable per patient's age: Cancer Screenings, Depression Screening, Fall Risk Screening, Immunizations)    LDL Cholesterol (mg/dL)   Date Value   64/40/347408/11/2011 94     AST (U/L)   Date Value   03/23/2016 35     ALT (U/L)   Date Value   03/23/2016 47 (H)     BUN (mg/dL)   Date Value   25/95/638709/11/2015 11      (goal A1C is < 7)   (goal LDL is <100) need 30-50% reduction from baseline     BP Readings from Last 3 Encounters:   08/11/17 136/79   02/21/17 132/88   03/23/16 134/83    (goal BP 120/80)      All Future Testing planned in [facility]:  Lab Frequency Next Occurrence   Comprehensive Metabolic Panel Once 02/21/2017   CBC Auto Differential Once 02/21/2017   Lipid Panel Once 02/21/2017       Next Visit Date:  Future Appointments  Date Time Provider Department Center   09/22/2017 9:20 AM Langston Maskerhristopher Gantt, PA-C [facility]            Patient Active Problem List:     Fatigue     Constipation     Chronic sinusitis     Migraine     Chronic bilateral low back pain without sciatica     Postnasal drip     Anxiety     Neck pain     Allergic rhinitis     Obesity (BMI 30-39.9)     Pneumonia of right lung due to infectious organism     Opioid dependence in remission [facility](HCC)     Lower abdominal pain     GI bleed     Abdominal pain, LUQ     Functional diarrhea     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

## 2017-08-15 NOTE — Telephone Encounter (Signed)
[facility] called stating the pts girlfriend (Codi Melvenia Beam) continuously calls to get his gabapentin filled early. The Pharmacist stated she called for his medication 5 separate times to be filled early over the weekend. When that Pharmacist would say no pts girlfriend would call other pharmacies to try and get it transferred to be filled early. The Pharmacy just wanted to inform his PCP.

## 2017-08-15 NOTE — Telephone Encounter (Signed)
Both patients are know to the provider. Pt was unhappy with not receiving increase in gabapentin at his appointment last week. He is scheduled with C Gantt in March who has been made aware of the situation.

## 2017-08-23 NOTE — Telephone Encounter (Signed)
Patient wife Adela LankJacqueline called for a increase on pt gabapentin. I spoke with Fleet Contrasachel regarding medication changes and she will not increase. Spoke with patient regarding medication and also discussed our HIPPA policy. Wife will need to be update on form in order for us to discuss his care.

## 2017-09-09 MED ORDER — GABAPENTIN 600 MG PO TABS
600 | ORAL_TABLET | Freq: Three times a day (TID) | ORAL | 1 refills | Status: DC
Start: 2017-09-09 — End: 2017-11-08

## 2017-09-09 NOTE — Telephone Encounter (Signed)
Health Maintenance   Topic Date Due   . Flu vaccine (1) 11/09/2017 (Originally 03/19/2017)   . DTaP/Tdap/Td vaccine (1 - Tdap) 09/07/2018 (Originally 09/23/2003)   . HIV screen  Completed             (applicable per patient's age: Cancer Screenings, Depression Screening, Fall Risk Screening, Immunizations)    LDL Cholesterol (mg/dL)   Date Value   16/10/960408/11/2011 94     AST (U/L)   Date Value   03/23/2016 35     ALT (U/L)   Date Value   03/23/2016 47 (H)     BUN (mg/dL)   Date Value   54/09/811909/11/2015 11      (goal A1C is < 7)   (goal LDL is <100) need 30-50% reduction from baseline     BP Readings from Last 3 Encounters:   08/11/17 136/79   02/21/17 132/88   03/23/16 134/83    (goal BP 120/80)      All Future Testing planned in [facility]:  Lab Frequency Next Occurrence   Comprehensive Metabolic Panel Once 02/21/2017   CBC Auto Differential Once 02/21/2017   Lipid Panel Once 02/21/2017       Next Visit Date:  Future Appointments  Date Time Provider Department Center   09/22/2017 9:20 AM Langston Maskerhristopher Gantt, PA-C [facility]            Patient Active Problem List:     Fatigue     Constipation     Chronic sinusitis     Migraine     Chronic bilateral low back pain without sciatica     Postnasal drip     Anxiety     Neck pain     Allergic rhinitis     Obesity (BMI 30-39.9)     Pneumonia of right lung due to infectious organism     Opioid dependence in remission [facility](HCC)     Lower abdominal pain     GI bleed     Abdominal pain, LUQ     Functional diarrhea     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

## 2017-09-19 ENCOUNTER — Telehealth: Payer: Self-pay | Admitting: Sports Medicine

## 2017-09-19 DIAGNOSIS — G8929 Other chronic pain: Secondary | ICD-10-CM

## 2017-09-19 DIAGNOSIS — M7121 Synovial cyst of popliteal space [Baker], right knee: Secondary | ICD-10-CM

## 2017-09-19 DIAGNOSIS — M25561 Pain in right knee: Secondary | ICD-10-CM

## 2017-09-19 NOTE — Telephone Encounter (Signed)
Referral placed to Dr. Everardo PacificVarkey with orthopedic surgery, he is going to need x-rays and an MRI beforehand.  Orders placed for this as well, he can go ahead and go downstairs for x-rays, and we can get him on the MRI schedule coming up.

## 2017-09-19 NOTE — Telephone Encounter (Signed)
Pt needs a referral to a Surgeon to have a bakers cyst removed from behind his knee. Thanks

## 2017-09-20 NOTE — Telephone Encounter (Signed)
Pt advised. He does not have insurance, so will be self pay. Based on this, we can proceed with MRI. Imaging notified. Will see if Provider still requires xray or not.

## 2017-09-22 ENCOUNTER — Encounter: Attending: Family | Primary: Family

## 2017-09-22 ENCOUNTER — Encounter: Attending: Physician Assistant | Primary: Family

## 2017-09-23 ENCOUNTER — Other Ambulatory Visit: Payer: Self-pay | Admitting: Sports Medicine

## 2017-09-23 DIAGNOSIS — Z1389 Encounter for screening for other disorder: Secondary | ICD-10-CM

## 2017-09-23 DIAGNOSIS — Z0189 Encounter for other specified special examinations: Secondary | ICD-10-CM

## 2017-09-26 ENCOUNTER — Ambulatory Visit (INDEPENDENT_AMBULATORY_CARE_PROVIDER_SITE_OTHER): Payer: Self-pay

## 2017-09-26 ENCOUNTER — Ambulatory Visit: Payer: Self-pay | Admitting: Sports Medicine

## 2017-09-26 DIAGNOSIS — G8929 Other chronic pain: Secondary | ICD-10-CM

## 2017-09-26 DIAGNOSIS — M7121 Synovial cyst of popliteal space [Baker], right knee: Secondary | ICD-10-CM

## 2017-09-26 DIAGNOSIS — Z77018 Contact with and (suspected) exposure to other hazardous metals: Secondary | ICD-10-CM

## 2017-09-26 DIAGNOSIS — M25561 Pain in right knee: Secondary | ICD-10-CM

## 2017-09-26 DIAGNOSIS — Z0189 Encounter for other specified special examinations: Secondary | ICD-10-CM

## 2017-09-26 DIAGNOSIS — Z1389 Encounter for screening for other disorder: Secondary | ICD-10-CM

## 2017-10-06 NOTE — Telephone Encounter (Signed)
He needs to schedule a follow up for any refills as he no showed his 3/7 appt with Bridgett Larssonhris Gantt. I also do not see where PT has been scheduled, which needs to be done to c.w this prescription

## 2017-10-06 NOTE — Telephone Encounter (Signed)
His last script was filled by [facility] on [address] on 09/12/17. I have no issue with changing pharmacy. However I do not see that he made any follow up appointment as I requested or scheduled any PT; which needs to be done

## 2017-10-06 NOTE — Telephone Encounter (Signed)
The patient said he never picked up the last medication it was to far so he called to get it switched to [facility] because he said its closer

## 2017-10-06 NOTE — Telephone Encounter (Signed)
Patient called and wants [facility] he said he does not use [facility] on [address]

## 2017-11-07 NOTE — Telephone Encounter (Signed)
I cannot refill this medication, he was advised last month when we called for a refill that he needed to have a follow up before further refills could be provided

## 2017-11-07 NOTE — Telephone Encounter (Signed)
Informed pt

## 2017-11-07 NOTE — Telephone Encounter (Signed)
Pt needs a refill sen to [facility].     Last visit: 08/11/17  Last Med refill: 09/09/17  Does patient have enough medication for 72 hours: no    Next Visit Date:  No future appointments.    Health Maintenance   Topic Date Due   ??? Varicella Vaccine (1 of 2 - 13+ 2-dose series) 09/22/1997   ??? DTaP/Tdap/Td vaccine (1 - Tdap) 09/07/2018 (Originally 09/23/2003)   ??? Flu vaccine (Season Ended) 03/19/2018   ??? HIV screen  Completed   ??? Pneumococcal 0-64 years Vaccine  Aged Out       No results found for: LABA1C          ( goal A1C is < 7)   No results found for: LABMICR  LDL Cholesterol (mg/dL)   Date Value   91/47/8295 94       (goal LDL is <100)   AST (U/L)   Date Value   03/23/2016 35     ALT (U/L)   Date Value   03/23/2016 47 (H)     BUN (mg/dL)   Date Value   62/13/0865 11     BP Readings from Last 3 Encounters:   08/11/17 136/79   02/21/17 132/88   03/23/16 134/83          (goal 120/80)    All Future Testing planned in [facility]  Lab Frequency Next Occurrence   Comprehensive Metabolic Panel Once 02/21/2017   CBC Auto Differential Once 02/21/2017   Lipid Panel Once 02/21/2017               Patient Active Problem List:     Fatigue     Constipation     Chronic sinusitis     Migraine     Chronic bilateral low back pain without sciatica     Postnasal drip     Anxiety     Neck pain     Allergic rhinitis     Obesity (BMI 30-39.9)     Pneumonia of right lung due to infectious organism     Opioid dependence in remission [facility])     Lower abdominal pain     GI bleed     Abdominal pain, LUQ     Functional diarrhea     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

## 2017-11-08 ENCOUNTER — Encounter: Payer: PRIVATE HEALTH INSURANCE | Attending: Family | Primary: Family

## 2017-11-08 ENCOUNTER — Ambulatory Visit: Admit: 2017-11-08 | Discharge: 2017-11-08 | Payer: PRIVATE HEALTH INSURANCE | Attending: Family | Primary: Family

## 2017-11-08 DIAGNOSIS — G8929 Other chronic pain: Secondary | ICD-10-CM

## 2017-11-08 MED ORDER — GABAPENTIN 600 MG PO TABS
600 MG | ORAL_TABLET | Freq: Three times a day (TID) | ORAL | 2 refills | Status: DC
Start: 2017-11-08 — End: 2018-01-25

## 2017-11-08 MED ORDER — CETIRIZINE HCL 10 MG PO TABS
10 MG | ORAL_TABLET | Freq: Every day | ORAL | 1 refills | Status: AC
Start: 2017-11-08 — End: 2017-12-08

## 2017-11-08 MED ORDER — FLUTICASONE PROPIONATE 50 MCG/ACT NA SUSP
50 MCG/ACT | Freq: Every day | NASAL | 3 refills | Status: DC
Start: 2017-11-08 — End: 2020-02-27

## 2017-11-08 NOTE — Patient Instructions (Signed)
[facility]- [place], Islam, MD  [address], [address]  [place], [place] ??[address]  [phone]

## 2017-11-08 NOTE — Progress Notes (Signed)
[facility]  [facility]  [address]  [address]  [place] [address]  Dept: [phone]  Dept Fax: [phone]    11/08/2017     David [name] (DOB:  [birth year])is a 32 y.o. male, here for evaluation of the following medical concerns:   Chief Complaint   Patient presents with   ??? Medication Refill   ??? Other     would like to discuss about his stomach issues   ??? Leg Pain       Taking gabapentin three times a day    Has not followed up with GI as advised last visit, not taking PPI. States it made his nausea worse    Back Pain   This is a chronic problem. The current episode started more than 1 year ago. The problem occurs constantly. The problem has been gradually worsening since onset. The pain is present in the lumbar spine. The quality of the pain is described as shooting. The pain radiates to the left thigh (sometimes, mostly stays in the back). The pain is worse during the day. The symptoms are aggravated by bending. Associated symptoms include numbness and tingling (gets tingling pain in the tops of his thighs). Pertinent negatives include no abdominal pain, chest pain, fever, paresthesias or weight loss. He has tried heat, analgesics, NSAIDs and muscle relaxant (copper back brace) for the symptoms. The treatment provided mild relief.   Abdominal Pain   This is a chronic problem. The current episode started more than 1 month ago. The problem occurs intermittently. The problem has been gradually improving. The pain is located in the LUQ (has always been there, hx of gastric ulcer). Associated symptoms include nausea. Pertinent negatives include no constipation, fever, hematuria, vomiting or weight loss.     .    Review of Systems   Constitutional: Negative for fever and weight loss.   HENT: Positive for congestion, sinus pressure, sinus pain and sneezing. Negative for rhinorrhea and trouble swallowing.    Cardiovascular: Negative for chest pain.   Gastrointestinal:  Positive for nausea. Negative for abdominal pain, constipation and vomiting.   Genitourinary: Negative for hematuria.   Musculoskeletal: Positive for back pain.   Neurological: Positive for tingling (gets tingling pain in the tops of his thighs) and numbness. Negative for paresthesias.       Prior to Visit Medications    Medication Sig Taking? Authorizing Provider   fluticasone (FLONASE) 50 MCG/ACT nasal spray 2 sprays by Nasal route daily Yes Curt Bears, APRN - CNP   gabapentin (NEURONTIN) 600 MG tablet Take 1 tablet by mouth 3 times daily. Yes Curt Bears, APRN - CNP   cetirizine (ZYRTEC) 10 MG tablet Take 1 tablet by mouth daily Yes Curt Bears, APRN - CNP   methadone 10 MG/5ML solution Take 45 mg by mouth daily  . Yes Historical Provider, MD        Social History     Tobacco Use   ??? Smoking status: Former Smoker     Packs/day: 1.00     Years: 5.00     Pack years: 5.00     Last attempt to quit: 09/16/2012     Years since quitting: 5.1   ??? Smokeless tobacco: Never Used   Substance Use Topics   ??? Alcohol use: Yes     Alcohol/week: 0.0 oz        Vitals:    11/08/17 0959   BP: 125/88  Site: Right Upper Arm   Position: Sitting   Cuff Size: Large Adult   Pulse: 93   Temp: 97.3 ??F (36.3 ??C)   TempSrc: Oral   SpO2: 94%   Weight: 237 lb (107.5 kg)     Estimated body mass index is 36.04 kg/m?? as calculated from the following:    Height as of 02/21/17: 5\' 8"  (1.727 m).    Weight as of this encounter: 237 lb (107.5 kg).    DIAGNOSTIC FINDINGS:  CBC:  Lab Results   Component Value Date    WBC 9.2 03/23/2016    HGB 15.6 03/23/2016    PLT 225 03/23/2016    PLT 203 03/31/2010       BMP:    Lab Results   Component Value Date    NA 143 03/23/2016    K 4.3 03/23/2016    CL 102 03/23/2016    CO2 27 03/23/2016    BUN 11 03/23/2016    CREATININE 0.88 03/23/2016    GLUCOSE 102 03/23/2016    GLUCOSE 96 03/31/2010       HEMOGLOBIN A1C: No results found for: LABA1C    FASTING LIPID PANEL:  Lab Results   Component Value Date     CHOL 143 02/21/2012    HDL 39 (L) 02/21/2012    TRIG 49 02/21/2012       Physical Exam   Constitutional: He is oriented to person, place, and time. He appears well-developed and well-nourished.   HENT:   Head: Normocephalic and atraumatic.   Neck: Neck supple.   Cardiovascular: Normal rate and regular rhythm.   Pulmonary/Chest: Effort normal and breath sounds normal.   Abdominal: Soft. Bowel sounds are normal. There is tenderness in the left upper quadrant.   Musculoskeletal: He exhibits tenderness. He exhibits no edema.        Lumbar back: He exhibits tenderness (left paraspinals only).        Back:    Neurological: He is oriented to person, place, and time.   Skin: Skin is warm and dry.   Psychiatric: He has a normal mood and affect. His behavior is normal.       ASSESSMENT     Diagnosis Orders   1. Chronic bilateral low back pain without sciatica  gabapentin (NEURONTIN) 600 MG tablet   2. Chronic nasal congestion  fluticasone (FLONASE) 50 MCG/ACT nasal spray          PLAN:  Orders Placed This Encounter   Medications   ??? fluticasone (FLONASE) 50 MCG/ACT nasal spray     Sig: 2 sprays by Nasal route daily     Dispense:  1 Bottle     Refill:  3   ??? gabapentin (NEURONTIN) 600 MG tablet     Sig: Take 1 tablet by mouth 3 times daily.     Dispense:  90 tablet     Refill:  2   ??? cetirizine (ZYRTEC) 10 MG tablet     Sig: Take 1 tablet by mouth daily     Dispense:  30 tablet     Refill:  1         1.  Will start Zyrtec to go along with Flonase, re-evaluate if not improving will send to ENT  2. C/W gabapentin  3. Educated on risks of smokeless tobacco and oral cancer, chew present in mouth during exam    FOLLOW UP AND INSTRUCTIONS:  Return in about 5 months (around 04/10/2018).    ?? [name]  received counseling on the following healthy behaviors:medication adherence    ?? Discussed use, benefit, and side effects of prescribed medications.  Barriers to medication compliance addressed.  All patient questions answered.  Pt  verbalized understanding of all instructions given.    ?? Patient given educational materials - see patient instructions      ?? Patient advised to contact scheduling offices for any referrals or imaging orders  placed today if they have not been contacted in 48 hours.         Return in about 5 months (around 04/10/2018).    An electronic signature was used to authenticate this note.    --Curt Bears, APRN - CNP on 11/08/2017 at 12:12 PM  Visit Information    Have you changed or started any medications since your last visit including any over-the-counter medicines, vitamins, or herbal medicines? no   Are you having any side effects from any of your medications? -  no  Have you stopped taking any of your medications? Is so, why? -  no    Have you seen any other physician or provider since your last visit? No  Have you had any other diagnostic tests since your last visit? No  Have you been seen in the emergency room and/or had an admission to a hospital since we last saw you? No  Have you had your routine dental cleaning in the past 6 months? no    Have you activated your MyChart account? If not, what are your barriers? Yes     Patient Care Team:  Curt Bears, APRN - CNP as PCP - General (Family Medicine)  [facility]  Corky Mull, MD as Consulting Physician (Gastroenterology)    Medical History Review  Past Medical, Family, and Social History reviewed and does not contribute to the patient presenting condition    Health Maintenance   Topic Date Due   ??? Varicella Vaccine (1 of 2 - 13+ 2-dose series) 09/22/1997   ??? DTaP/Tdap/Td vaccine (1 - Tdap) 09/07/2018 (Originally 09/23/2003)   ??? Flu vaccine (Season Ended) 03/19/2018   ??? HIV screen  Completed   ??? Pneumococcal 0-64 years Vaccine  Aged Out

## 2017-11-29 NOTE — Telephone Encounter (Signed)
Called and needs a refill on his gabapentin sent to [facility] on [address]

## 2017-11-29 NOTE — Telephone Encounter (Signed)
Pt informed

## 2017-11-29 NOTE — Telephone Encounter (Signed)
It was sent on 4/26 with 2 refills

## 2017-12-16 NOTE — Telephone Encounter (Signed)
Writer called pt and LVM to call office and schedule New Patient appt. Referral in chart. 1st attempt.

## 2018-01-25 ENCOUNTER — Encounter

## 2018-01-25 NOTE — Telephone Encounter (Signed)
Health Maintenance   Topic Date Due   . Varicella Vaccine (1 of 2 - 13+ 2-dose series) 09/22/1997   . DTaP/Tdap/Td vaccine (1 - Tdap) 09/07/2018 (Originally 09/23/2003)   . Flu vaccine (1) 03/19/2018   . HIV screen  Completed   . Pneumococcal 0-64 years Vaccine  Aged Out             (applicable per patient's age: Cancer Screenings, Depression Screening, Fall Risk Screening, Immunizations)    LDL Cholesterol (mg/dL)   Date Value   57/84/6962 94     AST (U/L)   Date Value   03/23/2016 35     ALT (U/L)   Date Value   03/23/2016 47 (H)     BUN (mg/dL)   Date Value   95/28/4132 11      (goal A1C is < 7)   (goal LDL is <100) need 30-50% reduction from baseline     BP Readings from Last 3 Encounters:   11/08/17 125/88   08/11/17 136/79   02/21/17 132/88    (goal BP 120/80)      All Future Testing planned in [facility]:  Lab Frequency Next Occurrence   Comprehensive Metabolic Panel Once 02/21/2017   CBC Auto Differential Once 02/21/2017   Lipid Panel Once 02/21/2017       Next Visit Date:  Future Appointments   Date Time Provider Department Center   04/11/2018 10:15 AM Curt Bears, APRN - CNP [facility]            Patient Active Problem List:     Fatigue     Constipation     Chronic sinusitis     Migraine     Chronic bilateral low back pain without sciatica     Postnasal drip     Anxiety     Neck pain     Allergic rhinitis     Obesity (BMI 30-39.9)     Pneumonia of right lung due to infectious organism     Opioid dependence in remission [facility])     Lower abdominal pain     GI bleed     Abdominal pain, LUQ     Functional diarrhea     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

## 2018-01-26 MED ORDER — GABAPENTIN 600 MG PO TABS
600 MG | ORAL_TABLET | ORAL | 0 refills | Status: DC
Start: 2018-01-26 — End: 2018-02-22

## 2018-02-22 ENCOUNTER — Encounter

## 2018-02-22 NOTE — Telephone Encounter (Signed)
Last visit: 11/08/17  Last Med refill:   Does patient have enough medication for 72 hours: no      Next Visit Date:  Future Appointments   Date Time Provider Department Center   04/11/2018 10:15 AM Curt Bearsachel J Brooks, APRN - CNP [facility]       Health Maintenance   Topic Date Due   ??? Varicella Vaccine (1 of 2 - 13+ 2-dose series) 09/22/1997   ??? DTaP/Tdap/Td vaccine (1 - Tdap) 09/07/2018 (Originally 09/23/2003)   ??? Flu vaccine (1) 03/19/2018   ??? HIV screen  Completed   ??? Pneumococcal 0-64 years Vaccine  Aged Out       No results found for: LABA1C          ( goal A1C is < 7)   No results found for: LABMICR  LDL Cholesterol (mg/dL)   Date Value   16/10/960408/11/2011 94       (goal LDL is <100)   AST (U/L)   Date Value   03/23/2016 35     ALT (U/L)   Date Value   03/23/2016 47 (H)     BUN (mg/dL)   Date Value   54/09/811909/11/2015 11     BP Readings from Last 3 Encounters:   11/08/17 125/88   08/11/17 136/79   02/21/17 132/88          (goal 120/80)    All Future Testing planned in [facility]  Lab Frequency Next Occurrence               Patient Active Problem List:     Fatigue     Constipation     Chronic sinusitis     Migraine     Chronic bilateral low back pain without sciatica     Postnasal drip     Anxiety     Neck pain     Allergic rhinitis     Obesity (BMI 30-39.9)     Pneumonia of right lung due to infectious organism     Opioid dependence in remission [facility](HCC)     Lower abdominal pain     GI bleed     Abdominal pain, LUQ     Functional diarrhea     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

## 2018-02-23 MED ORDER — GABAPENTIN 600 MG PO TABS
600 MG | ORAL_TABLET | Freq: Three times a day (TID) | ORAL | 0 refills | Status: DC
Start: 2018-02-23 — End: 2018-03-22

## 2018-03-21 NOTE — Telephone Encounter (Signed)
Codi was informed and pt was RS for sooner appt on 9/13.

## 2018-03-21 NOTE — Telephone Encounter (Signed)
This can be discussed at his next appointment

## 2018-03-21 NOTE — Telephone Encounter (Signed)
Pt called and wanted to know if you can increase his dosage for gabapetin, he is having nerve pain evan with the dosage he is taking. Please advise    Health Maintenance   Topic Date Due   ??? Varicella Vaccine (1 of 2 - 13+ 2-dose series) 09/22/1997   ??? Flu vaccine (1) 03/19/2018   ??? DTaP/Tdap/Td vaccine (1 - Tdap) 09/07/2018 (Originally 09/23/2003)   ??? HIV screen  Completed   ??? Pneumococcal 0-64 years Vaccine  Aged Out             (applicable per patient's age: Cancer Screenings, Depression Screening, Fall Risk Screening, Immunizations)    LDL Cholesterol (mg/dL)   Date Value   50/35/4656 94     AST (U/L)   Date Value   03/23/2016 35     ALT (U/L)   Date Value   03/23/2016 47 (H)     BUN (mg/dL)   Date Value   81/27/5170 11      (goal A1C is < 7)   (goal LDL is <100) need 30-50% reduction from baseline     BP Readings from Last 3 Encounters:   11/08/17 125/88   08/11/17 136/79   02/21/17 132/88    (goal BP 120/80)      All Future Testing planned in [facility]:  Lab Frequency Next Occurrence       Next Visit Date:  Future Appointments   Date Time Provider Department Center   04/11/2018 10:15 AM Curt Bears, APRN - CNP [facility]            Patient Active Problem List:     Fatigue     Constipation     Chronic sinusitis     Migraine     Chronic bilateral low back pain without sciatica     Postnasal drip     Anxiety     Neck pain     Allergic rhinitis     Obesity (BMI 30-39.9)     Pneumonia of right lung due to infectious organism     Opioid dependence in remission [facility])     Lower abdominal pain     GI bleed     Abdominal pain, LUQ     Functional diarrhea     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

## 2018-03-22 ENCOUNTER — Encounter

## 2018-03-23 ENCOUNTER — Ambulatory Visit
Admit: 2018-03-23 | Discharge: 2018-03-23 | Payer: PRIVATE HEALTH INSURANCE | Attending: Internal Medicine | Primary: Family

## 2018-03-23 DIAGNOSIS — R1012 Left upper quadrant pain: Secondary | ICD-10-CM

## 2018-03-23 MED ORDER — GABAPENTIN 800 MG PO TABS
800 MG | ORAL_TABLET | Freq: Three times a day (TID) | ORAL | 0 refills | Status: DC
Start: 2018-03-23 — End: 2018-04-12

## 2018-03-23 MED ORDER — GABAPENTIN 600 MG PO TABS
600 MG | ORAL_TABLET | ORAL | 0 refills | Status: DC
Start: 2018-03-23 — End: 2018-04-12
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
## 2018-03-23 NOTE — Telephone Encounter (Signed)
Health Maintenance   Topic Date Due   . Varicella Vaccine (1 of 2 - 13+ 2-dose series) 09/22/1997   . Flu vaccine (1) 03/19/2018   . DTaP/Tdap/Td vaccine (1 - Tdap) 09/07/2018 (Originally 09/23/2003)   . HIV screen  Completed   . Pneumococcal 0-64 years Vaccine  Aged Out             (applicable per patient's age: Cancer Screenings, Depression Screening, Fall Risk Screening, Immunizations)    LDL Cholesterol (mg/dL)   Date Value   18/84/1660 94     AST (U/L)   Date Value   03/23/2016 35     ALT (U/L)   Date Value   03/23/2016 47 (H)     BUN (mg/dL)   Date Value   63/07/6008 11      (goal A1C is < 7)   (goal LDL is <100) need 30-50% reduction from baseline     BP Readings from Last 3 Encounters:   11/08/17 125/88   08/11/17 136/79   02/21/17 132/88    (goal BP 120/80)      All Future Testing planned in [facility]:  Lab Frequency Next Occurrence       Next Visit Date:  Future Appointments   Date Time Provider Department Center   03/31/2018 12:15 PM Curt Bears, APRN - CNP [facility]            Patient Active Problem List:     Fatigue     Constipation     Chronic sinusitis     Migraine     Chronic bilateral low back pain without sciatica     Postnasal drip     Anxiety     Neck pain     Allergic rhinitis     Obesity (BMI 30-39.9)     Pneumonia of right lung due to infectious organism     Opioid dependence in remission [facility])     Lower abdominal pain     GI bleed     Abdominal pain, LUQ     Functional diarrhea     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

## 2018-03-23 NOTE — Progress Notes (Signed)
Visit Information    Have you changed or started any medications since your last visit including any over-the-counter medicines, vitamins, or herbal medicines? no   Are you having any side effects from any of your medications? -  no  Have you stopped taking any of your medications? Is so, why? -  no    Have you seen any other physician or provider since your last visit? No  Have you had any other diagnostic tests since your last visit? No  Have you been seen in the emergency room and/or had an admission to a hospital since we last saw you? No  Have you had your routine dental cleaning in the past 6 months? no    Have you activated your MyChart account? If not, what are your barriers? Yes     Patient Care Team:  Curt Bears, APRN - CNP as PCP - General (Family Medicine)  Curt Bears, APRN - CNP as PCP - [facility] Empaneled Provider  [facility]  Corky Mull, MD as Consulting Physician (Gastroenterology)    Medical History Review  Past Medical, Family, and Social History reviewed and does contribute to the patient presenting condition    Health Maintenance   Topic Date Due   ??? Varicella Vaccine (1 of 2 - 13+ 2-dose series) 09/22/1997   ??? Flu vaccine (1) 03/19/2018   ??? DTaP/Tdap/Td vaccine (1 - Tdap) 09/07/2018 (Originally 09/23/2003)   ??? HIV screen  Completed   ??? Pneumococcal 0-64 years Vaccine  Aged Out

## 2018-03-31 ENCOUNTER — Encounter: Attending: Family | Primary: Family

## 2018-04-11 ENCOUNTER — Encounter: Attending: Family | Primary: Family

## 2018-04-12 ENCOUNTER — Telehealth

## 2018-04-12 MED ORDER — GABAPENTIN 600 MG PO TABS
600 MG | ORAL_TABLET | ORAL | 0 refills | Status: DC
Start: 2018-04-12 — End: 2018-05-15

## 2018-04-12 NOTE — Telephone Encounter (Signed)
I will but not at 800 mg, he and I have discussed this a length

## 2018-04-12 NOTE — Telephone Encounter (Signed)
Pt notified.

## 2018-04-12 NOTE — Telephone Encounter (Signed)
Pt would like a refill on Gabapentin. Please advise

## 2018-05-15 ENCOUNTER — Encounter

## 2018-05-15 MED ORDER — GABAPENTIN 600 MG PO TABS
600 MG | ORAL_TABLET | ORAL | 0 refills | Status: DC
Start: 2018-05-15 — End: 2018-05-31

## 2018-05-31 ENCOUNTER — Encounter

## 2018-05-31 NOTE — Telephone Encounter (Signed)
Health Maintenance   Topic Date Due   ??? Varicella Vaccine (1 of 2 - 2-dose childhood series) 09/22/1985   ??? Flu vaccine (1) 03/19/2018   ??? DTaP/Tdap/Td vaccine (1 - Tdap) 09/07/2018 (Originally 09/23/1995)   ??? HIV screen  Completed   ??? Pneumococcal 0-64 years Vaccine  Aged Out             (applicable per patient's age: Cancer Screenings, Depression Screening, Fall Risk Screening, Immunizations)    LDL Cholesterol (mg/dL)   Date Value   16/10/960408/11/2011 94     AST (U/L)   Date Value   03/23/2016 35     ALT (U/L)   Date Value   03/23/2016 47 (H)     BUN (mg/dL)   Date Value   54/09/811909/11/2015 11      (goal A1C is < 7)   (goal LDL is <100) need 30-50% reduction from baseline     BP Readings from Last 3 Encounters:   03/23/18 121/85   11/08/17 125/88   08/11/17 136/79    (goal BP 120/80)      All Future Testing planned in [facility]:  Lab Frequency Next Occurrence       Next Visit Date:  No future appointments.         Patient Active Problem List:     Fatigue     Constipation     Chronic sinusitis     Migraine     Chronic bilateral low back pain without sciatica     Postnasal drip     Anxiety     Neck pain     Allergic rhinitis     Obesity (BMI 30-39.9)     Pneumonia of right lung due to infectious organism     Opioid dependence in remission [facility](HCC)     Lower abdominal pain     GI bleed     Abdominal pain, LUQ     Functional diarrhea     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

## 2018-06-01 MED ORDER — GABAPENTIN 600 MG PO TABS
600 MG | ORAL_TABLET | ORAL | 0 refills | Status: DC
Start: 2018-06-01 — End: 2018-07-20

## 2018-06-01 NOTE — Telephone Encounter (Signed)
Please call patient to schedule an appointment before further refills provided

## 2018-06-01 NOTE — Telephone Encounter (Signed)
Placed call to patient, phone rings busy, unable to leave message

## 2018-07-04 ENCOUNTER — Encounter

## 2018-07-04 NOTE — Telephone Encounter (Signed)
Unable to reach patient, phone rings busy.

## 2018-07-04 NOTE — Telephone Encounter (Signed)
LOV 11-08-17  LRF 06-01-18    Health Maintenance   Topic Date Due   ??? Varicella Vaccine (1 of 2 - 2-dose childhood series) 09/22/1985   ??? Flu vaccine (1) 03/19/2018   ??? DTaP/Tdap/Td vaccine (1 - Tdap) 09/07/2018 (Originally 09/23/1995)   ??? HIV screen  Completed   ??? Pneumococcal 0-64 years Vaccine  Aged Out             (applicable per patient's age: Cancer Screenings, Depression Screening, Fall Risk Screening, Immunizations)    LDL Cholesterol (mg/dL)   Date Value   45/40/981108/11/2011 94     AST (U/L)   Date Value   03/23/2016 35     ALT (U/L)   Date Value   03/23/2016 47 (H)     BUN (mg/dL)   Date Value   91/47/829509/11/2015 11      (goal A1C is < 7)   (goal LDL is <100) need 30-50% reduction from baseline     BP Readings from Last 3 Encounters:   03/23/18 121/85   11/08/17 125/88   08/11/17 136/79    (goal BP 120/80)      All Future Testing planned in [facility]:  Lab Frequency Next Occurrence       Next Visit Date:  No future appointments.         Patient Active Problem List:     Fatigue     Constipation     Chronic sinusitis     Migraine     Chronic bilateral low back pain without sciatica     Postnasal drip     Anxiety     Neck pain     Allergic rhinitis     Obesity (BMI 30-39.9)     Pneumonia of right lung due to infectious organism     Opioid dependence in remission [facility](HCC)     Lower abdominal pain     GI bleed     Abdominal pain, LUQ     Functional diarrhea     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

## 2018-07-04 NOTE — Telephone Encounter (Signed)
Please call patient to schedule an appointment.

## 2018-07-17 ENCOUNTER — Encounter: Attending: Family | Primary: Family

## 2018-07-20 ENCOUNTER — Ambulatory Visit: Admit: 2018-07-20 | Discharge: 2018-07-20 | Payer: PRIVATE HEALTH INSURANCE | Attending: Family | Primary: Family

## 2018-07-20 DIAGNOSIS — G8929 Other chronic pain: Secondary | ICD-10-CM

## 2018-07-20 DIAGNOSIS — M545 Low back pain, unspecified: Secondary | ICD-10-CM

## 2018-07-20 MED ORDER — GABAPENTIN 600 MG PO TABS
600 MG | ORAL_TABLET | ORAL | 2 refills | Status: DC
Start: 2018-07-20 — End: 2018-10-09

## 2018-07-20 MED ORDER — OMEPRAZOLE 20 MG PO CPDR
20 MG | ORAL_CAPSULE | Freq: Every day | ORAL | 5 refills | Status: DC
Start: 2018-07-20 — End: 2020-02-27

## 2018-07-20 NOTE — Patient Instructions (Signed)
[facility]- Dallas Schimke, MD  [address]  [place], [place] ??[address]  [phone]

## 2018-07-20 NOTE — Progress Notes (Signed)
[facility]  [facility]  [address]  [facility] MAIN [place] [address]  Dept: [phone]  Dept Fax: [phone]    Patient Care Team:  Curt Bears, APRN - CNP as PCP - General (Family Medicine)  Curt Bears, APRN - CNP as PCP - [facility] Empaneled Provider  [facility]  Corky Mull, MD as Consulting Physician (Gastroenterology)    07/20/2018     David [name] (DOB:  Apr 16, 1986)is a 33 y.o. male, here for evaluation of the following medical concerns:   Chief Complaint   Patient presents with   ??? Medication Refill   ??? Leg Pain     pt states that he has been having soreness in his legs.    ??? Nausea       Taking gabapentin three times a day    Has not followed up with GI as advised last visit, not taking PPI. States it made his nausea worse.  Now c/o bowel changes as well, more soft to liquid stools as well as foul smelling flatus    Back Pain   This is a chronic problem. The current episode started more than 1 year ago. The problem occurs constantly. The problem has been gradually worsening since onset. The pain is present in the lumbar spine. The quality of the pain is described as shooting. The pain radiates to the left thigh (sometimes, mostly stays in the back). The pain is worse during the day. The symptoms are aggravated by bending. Associated symptoms include numbness, paresthesias (buring in upper and outer right thigh) and tingling (gets tingling pain in the tops of his thighs). Pertinent negatives include no abdominal pain, bladder incontinence, bowel incontinence, chest pain, dysuria, fever, pelvic pain, perianal numbness, weakness or weight loss. He has tried heat, analgesics, NSAIDs and muscle relaxant (copper back brace) for the symptoms. The treatment provided mild relief.   Abdominal Pain   This is a chronic problem. The current episode started more than 1 month ago. The problem occurs intermittently. The problem has been gradually improving. The pain  is located in the LUQ (has always been there, hx of gastric ulcer). Associated symptoms include diarrhea and nausea. Pertinent negatives include no constipation, dysuria, fever, frequency, hematuria, vomiting or weight loss. Nothing aggravates the pain. The pain is relieved by bowel movements. He has tried proton pump inhibitors for the symptoms. The treatment provided no relief. Prior diagnostic workup includes GI consult.     .    Review of Systems   Constitutional: Negative for fever and weight loss.   HENT: Negative for congestion, sore throat and voice change.    Respiratory: Positive for choking (sometimes food feels stuck). Negative for chest tightness and shortness of breath.    Cardiovascular: Negative for chest pain.   Gastrointestinal: Positive for diarrhea and nausea. Negative for abdominal distention, abdominal pain, blood in stool, bowel incontinence, constipation and vomiting.   Genitourinary: Negative for bladder incontinence, decreased urine volume, difficulty urinating, dysuria, frequency, hematuria, pelvic pain and testicular pain.   Musculoskeletal: Positive for back pain. Negative for gait problem.   Skin: Negative for rash and wound.   Neurological: Positive for tingling (gets tingling pain in the tops of his thighs), numbness and paresthesias (buring in upper and outer right thigh). Negative for seizures and weakness.       Prior to Visit Medications    Medication Sig Taking? Authorizing Provider   omeprazole (PRILOSEC) 20 MG delayed  release capsule Take 1 capsule by mouth every morning (before breakfast) Yes Curt Bearsachel J Japheth Diekman, APRN - CNP   gabapentin (NEURONTIN) 600 MG tablet TAKE 1 TAB BY MOUTH THREE TIMES DAILY Yes Curt Bearsachel J Abdel Effinger, APRN - CNP   fluticasone (FLONASE) 50 MCG/ACT nasal spray 2 sprays by Nasal route daily Yes Curt Bearsachel J Brittyn Salaz, APRN - CNP   methadone 10 MG/5ML solution Take 45 mg by mouth daily  . Yes Historical Provider, MD        Social History     Tobacco Use   ??? Smoking status:  Former Smoker     Packs/day: 1.00     Years: 5.00     Pack years: 5.00     Last attempt to quit: 09/16/2012     Years since quitting: 5.8   ??? Smokeless tobacco: Current User     Types: Chew   Substance Use Topics   ??? Alcohol use: Yes     Alcohol/week: 0.0 standard drinks        Vitals:    07/20/18 0935   BP: 137/86   Site: Right Upper Arm   Position: Sitting   Cuff Size: Large Adult   Pulse: 82   Temp: 97.2 ??F (36.2 ??C)   TempSrc: Oral   SpO2: 96%   Weight: 261 lb (118.4 kg)     Estimated body mass index is 39.68 kg/m?? as calculated from the following:    Height as of 03/23/18: 5\' 8"  (1.727 m).    Weight as of this encounter: 261 lb (118.4 kg).    DIAGNOSTIC FINDINGS:  CBC:  Lab Results   Component Value Date    WBC 9.2 03/23/2016    HGB 15.6 03/23/2016    PLT 225 03/23/2016    PLT 203 03/31/2010       BMP:    Lab Results   Component Value Date    NA 143 03/23/2016    K 4.3 03/23/2016    CL 102 03/23/2016    CO2 27 03/23/2016    BUN 11 03/23/2016    CREATININE 0.88 03/23/2016    GLUCOSE 102 03/23/2016    GLUCOSE 96 03/31/2010       HEMOGLOBIN A1C: No results found for: LABA1C    FASTING LIPID PANEL:  Lab Results   Component Value Date    CHOL 143 02/21/2012    HDL 39 (L) 02/21/2012    TRIG 49 02/21/2012       Physical Exam  Constitutional:       General: He is not in acute distress.     Appearance: He is well-developed.   HENT:      Head: Normocephalic and atraumatic.      Right Ear: External ear normal.      Left Ear: External ear normal.      Mouth/Throat:      Mouth: Mucous membranes are moist.   Eyes:      General: No scleral icterus.  Neck:      Musculoskeletal: Neck supple.   Cardiovascular:      Rate and Rhythm: Normal rate and regular rhythm.   Pulmonary:      Effort: Pulmonary effort is normal.      Breath sounds: Normal breath sounds.   Abdominal:      General: Bowel sounds are normal.      Palpations: Abdomen is soft.      Tenderness: There is tenderness in the left upper quadrant.   Musculoskeletal:  General: Tenderness present.      Lumbar back: He exhibits tenderness (left paraspinals only).        Back:    Skin:     General: Skin is warm and dry.   Neurological:      General: No focal deficit present.      Mental Status: He is alert and oriented to person, place, and time. Mental status is at baseline.      Coordination: Coordination normal.      Gait: Gait normal.   Psychiatric:         Behavior: Behavior normal.         ASSESSMENT     Diagnosis Orders   1. Chronic bilateral low back pain without sciatica  gabapentin (NEURONTIN) 600 MG tablet    [facility]   2. Obesity (BMI 30-39.9)     3. Lumbosacral radiculopathy  [facility]   4. Barrett's esophagus determined by endoscopy  omeprazole (PRILOSEC) 20 MG delayed release capsule          PLAN:  Orders Placed This Encounter   Medications   ??? omeprazole (PRILOSEC) 20 MG delayed release capsule     Sig: Take 1 capsule by mouth every morning (before breakfast)     Dispense:  30 capsule     Refill:  5   ??? gabapentin (NEURONTIN) 600 MG tablet     Sig: TAKE 1 TAB BY MOUTH THREE TIMES DAILY     Dispense:  90 tablet     Refill:  2         1.  Yusuke and I discussed the progression of both his back and GI symptoms. He has failed to f/u with GI since his EGD over 1 year ago and also never went to PT.   2. His radicular symptoms are slowly worsening. I again advised him on a course of PT, that sometimes the pain is more related to MSK stiffness and changes and not always spinal. However at this time it does not warrant an MRI, at least not until he has failed conservative therapy. PVU  3. Switch PPI and given GI's number. Again discussed the need to see GI, he verbalized they also wanted to perform a colonoscopy on him. Recommended small, frequent meals and avoiding irritants such as caffeine and fatty meals to monitor for improvement.  4. Francee PiccoloDerek is aware of his weight gain and has goals of going [facility], wishes to  be down to 190 lbs this time next year    FOLLOW UP AND INSTRUCTIONS:  Return in about 2 months (around 09/18/2018).    ?? Pride received counseling on the following healthy behaviors:nutrition, exercise and medication adherence    ?? Discussed use, benefit, and side effects of prescribed medications.  Barriers to  medication compliance addressed.  All patient questions answered.  Pt  verbalized understanding of all instructions given.    ?? Patient given educational materials - see patient instructions      ?? Patient advised to contact scheduling offices for any referrals or imaging orders  placed today if they have not been contacted in 48 hours.         Return in about 2 months (around 09/18/2018).    An electronic signature was used to authenticate this note.    --Curt Bearsachel J Ursala Cressy, APRN - CNP on 07/20/2018 at 10:21 AM  Visit Information    Have you changed or started  any medications since your last visit including any over-the-counter medicines, vitamins, or herbal medicines? no   Are you having any side effects from any of your medications? -  no  Have you stopped taking any of your medications? Is so, why? -  no    Have you seen any other physician or provider since your last visit? No  Have you had any other diagnostic tests since your last visit? No  Have you been seen in the emergency room and/or had an admission to a hospital since we last saw you? No  Have you had your routine dental cleaning in the past 6 months? no    Have you activated your MyChart account? If not, what are your barriers? Yes     Patient Care Team:  Curt Bears, APRN - CNP as PCP - General (Family Medicine)  Curt Bears, APRN - CNP as PCP - [facility] Empaneled Provider  [facility]  Corky Mull, MD as Consulting Physician (Gastroenterology)    Medical History Review  Past Medical, Family, and Social History reviewed and does not contribute to the patient presenting condition    Health Maintenance   Topic Date Due   ??? Flu vaccine (1)  03/19/2018   ??? Varicella Vaccine (1 of 2 - 2-dose childhood series) 08/10/2018 (Originally 09/22/1985)   ??? DTaP/Tdap/Td vaccine (1 - Tdap) 09/07/2018 (Originally 09/23/1995)   ??? HIV screen  Completed   ??? Pneumococcal 0-64 years Vaccine  Aged Out

## 2018-07-28 NOTE — Other (Signed)
[]   [facility] &  Therapy  [address]    [phone]  F: [phone]   []  [facility] - [facility]  Outpatient Rehabilitation &  Therapy  [address]   [address]  P: [phone]  F: [phone]  []  [facility] -  [facility]  Outpatient Rehabilitation &  Therapy  [address]  P: [phone]  F: [phone]  []  [facility]  Outpatient Rehabilitation &  Therapy  [address]   [address]   [place]: [phone]  F: [phone]   []  [facility] - [facility] & Therapy  [address]  [place]: [phone]   F: [phone]     Physical Therapy Cancel/No Show note    Date: 07/28/2018  Patient: David [name]  DOB: 1985/12/11  MRN: [id number]    Cancels/No Shows to date: 0/1    For today's appointment patient:    []   Cancelled    []  Rescheduled appointment    [x]  No-show     Reason given by patient:    []   Patient ill    []   Conflicting appointment    []  No transportation      []  Conflict with work    []  No reason given    []  Weather related    [x]  Other:      Comments:  Pt no-showed for initial evaluation this date.      []  Next appointment was confirmed    Electronically signed by: Gertie Fey, PT

## 2018-07-29 ENCOUNTER — Inpatient Hospital Stay: Payer: PRIVATE HEALTH INSURANCE | Primary: Family

## 2018-09-21 ENCOUNTER — Encounter: Attending: Family | Primary: Family

## 2018-10-09 ENCOUNTER — Encounter

## 2018-10-09 MED ORDER — GABAPENTIN 600 MG PO TABS
600 MG | ORAL_TABLET | ORAL | 1 refills | Status: DC
Start: 2018-10-09 — End: 2018-12-13

## 2018-10-09 NOTE — Telephone Encounter (Signed)
Health Maintenance   Topic Date Due   ??? Varicella vaccine (1 of 2 - 2-dose childhood series) 09/22/1985   ??? DTaP/Tdap/Td vaccine (1 - Tdap) 09/23/2003   ??? Flu vaccine (1) 03/19/2018   ??? Shingles Vaccine (1 of 2) 09/23/2034   ??? HIV screen  Completed   ??? Hepatitis A vaccine  Aged Out   ??? Hepatitis B vaccine  Aged Out   ??? Hib vaccine  Aged Out   ??? Meningococcal (ACWY) vaccine  Aged Out   ??? Pneumococcal 0-64 years Vaccine  Aged Out             (applicable per patient's age: Cancer Screenings, Depression Screening, Fall Risk Screening, Immunizations)    LDL Cholesterol (mg/dL)   Date Value   15/95/3967 94     AST (U/L)   Date Value   03/23/2016 35     ALT (U/L)   Date Value   03/23/2016 47 (H)     BUN (mg/dL)   Date Value   28/97/9150 11      (goal A1C is < 7)   (goal LDL is <100) need 30-50% reduction from baseline     BP Readings from Last 3 Encounters:   07/20/18 137/86   03/23/18 121/85   11/08/17 125/88    (goal BP 120/80)      All Future Testing planned in [facility]:  Lab Frequency Next Occurrence       Next Visit Date:  No future appointments.         Patient Active Problem List:     Fatigue     Constipation     Chronic sinusitis     Migraine     Chronic bilateral low back pain without sciatica     Postnasal drip     Anxiety     Neck pain     Atopic rhinitis     Obesity with body mass index 30 or greater     Pneumonia of right lung due to infectious organism     Opioid dependence in remission [facility])     Lower abdominal pain     GI bleed     Abdominal pain, LUQ     Functional diarrhea     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

## 2018-10-11 ENCOUNTER — Encounter

## 2018-12-07 ENCOUNTER — Encounter

## 2018-12-07 NOTE — Telephone Encounter (Signed)
Please let Omkar know I am not going to fill this medication at this time.  He was due for a follow-up 2 months ago, I also see he has still not completed physical therapy as part of her treatment plan.

## 2018-12-07 NOTE — Telephone Encounter (Signed)
Health Maintenance   Topic Date Due   ??? Varicella vaccine (1 of 2 - 2-dose childhood series) 09/22/1985   ??? DTaP/Tdap/Td vaccine (1 - Tdap) 09/23/2003   ??? Flu vaccine (Season Ended) 03/20/2019   ??? HIV screen  Completed   ??? Hepatitis A vaccine  Aged Out   ??? Hepatitis B vaccine  Aged Out   ??? Hib vaccine  Aged Out   ??? Meningococcal (ACWY) vaccine  Aged Out   ??? Pneumococcal 0-64 years Vaccine  Aged Out             (applicable per patient's age: Cancer Screenings, Depression Screening, Fall Risk Screening, Immunizations)    LDL Cholesterol (mg/dL)   Date Value   05/17/1313 94     AST (U/L)   Date Value   03/23/2016 35     ALT (U/L)   Date Value   03/23/2016 47 (H)     BUN (mg/dL)   Date Value   38/88/7579 11      (goal A1C is < 7)   (goal LDL is <100) need 30-50% reduction from baseline     BP Readings from Last 3 Encounters:   07/20/18 137/86   03/23/18 121/85   11/08/17 125/88    (goal BP 120/80)      All Future Testing planned in [facility]:  Lab Frequency Next Occurrence       Next Visit Date:  No future appointments.         Patient Active Problem List:     Fatigue     Constipation     Chronic sinusitis     Migraine     Chronic bilateral low back pain without sciatica     Postnasal drip     Anxiety     Neck pain     Atopic rhinitis     Obesity with body mass index 30 or greater     Pneumonia of right lung due to infectious organism     Opioid dependence in remission [facility])     Lower abdominal pain     GI bleed     Abdominal pain, LUQ     Functional diarrhea     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

## 2018-12-08 ENCOUNTER — Encounter

## 2018-12-08 NOTE — Telephone Encounter (Signed)
Girlfriend is calling to see why pt medication was denied. Last refill was 4/23 and he will be out for the weekend. Please review

## 2018-12-12 NOTE — Telephone Encounter (Signed)
There is an existing phone encounter regarding this.  He has not kept his follow-up appointments or completed physical therapy as discussed.

## 2018-12-13 ENCOUNTER — Telehealth: Admit: 2018-12-13 | Discharge: 2018-12-13 | Payer: PRIVATE HEALTH INSURANCE | Attending: Family | Primary: Family

## 2018-12-13 DIAGNOSIS — M545 Low back pain, unspecified: Secondary | ICD-10-CM

## 2018-12-13 DIAGNOSIS — G8929 Other chronic pain: Secondary | ICD-10-CM

## 2018-12-13 MED ORDER — GABAPENTIN 600 MG PO TABS
600 MG | ORAL_TABLET | ORAL | 2 refills | Status: DC
Start: 2018-12-13 — End: 2019-03-12

## 2018-12-13 NOTE — Progress Notes (Signed)
Visit Information    Have you changed or started any medications since your last visit including any over-the-counter medicines, vitamins, or herbal medicines? no   Are you having any side effects from any of your medications? -  no  Have you stopped taking any of your medications? Is so, why? -  no    Have you seen any other physician or provider since your last visit? No  Have you had any other diagnostic tests since your last visit? No  Have you been seen in the emergency room and/or had an admission to a hospital since we last saw you? No  Have you had your routine dental cleaning in the past 6 months? no    Have you activated your MyChart account? If not, what are your barriers? Yes     Patient Care Team:  Curt Bears, APRN - CNP as PCP - General (Family Medicine)  Curt Bears, APRN - CNP as PCP - [facility] Empaneled Provider  [facility]  Corky Mull, MD as Consulting Physician (Gastroenterology)    Medical History Review  Past Medical, Family, and Social History reviewed and does contribute to the patient presenting condition    Health Maintenance   Topic Date Due   ??? Varicella vaccine (1 of 2 - 2-dose childhood series) 09/22/1985   ??? DTaP/Tdap/Td vaccine (1 - Tdap) 09/23/2003   ??? Flu vaccine (Season Ended) 03/20/2019   ??? HIV screen  Completed   ??? Hepatitis A vaccine  Aged Out   ??? Hepatitis B vaccine  Aged Out   ??? Hib vaccine  Aged Out   ??? Meningococcal (ACWY) vaccine  Aged Out   ??? Pneumococcal 0-64 years Vaccine  Aged Out

## 2018-12-13 NOTE — Progress Notes (Signed)
[facility]  [facility]  [address]  [place] [address]  Dept: [phone]  Dept Fax: [phone]    Patient Care Team:  Curt Bears, APRN - CNP as PCP - General (Family Medicine)  Curt Bears, APRN - CNP as PCP - [facility] Empaneled Provider  [facility]  Corky Mull, MD as Consulting Physician (Gastroenterology)    David [name] is a 33 y.o. male evaluated via telephone on 12/13/2018.      Consent:  He and/or health care decision maker is aware that that he may receive a bill for this telephone service, depending on his insurance coverage, and has provided verbal consent to proceed: Yes      Documentation:  I communicated with the patient and/or health care decision maker about back pain.   Details of this discussion including any medical advice provided: David [name]        I affirm this is a Patient Initiated Episode with a Patient who has not had a related appointment within my department in the past 7 days or scheduled within the next 24 hours.    Patient identification was verified at the start of the visit: Yes    Total Time: minutes: 21-30 minutes    Note: not billable if this call serves to triage the patient into an appointment for the relevant concern      Curt Bears     12/13/2018     David [name] (DOB:  [birth year])is a 33 y.o. male, here for evaluation of the following medical concerns:   Chief Complaint   Patient presents with   ??? Medication Refill   ??? Other     Hurts to swallow, hard to breath out of nose. would like a ENT referral        Taking gabapentin three times a day. No major changes in his chronic pain. However, he has had progression of a burning, numbing pain in his thigh waking him up at night. Usually his leg gets tingling or numb when sitting during the day. Movement can make it worse, just has to wait it out. Both legs hurt, right is worse than the left.  Overall the daily pain is better as he's not been working during the pandemic. He's  been able to do light exercise    Has not followed up with GI as advised last visit, not taking PPI. States it made his nausea worse.  Now c/o bowel changes as well, more soft to liquid stools as well as foul smelling flatus. Noticing he's clearing his throat more and having some trouble swallowing. Former smoker, not a current smoker.     Concerned about chronic nasal congestion. Daily, worsening. Flonase is no longer helping as much. Was taking it daily. + HA's more recently. Had them in his 20's, but they went away.Thinks the HA's are related to stress. He's had has nose broken a few times and wonders of he's had a deviated septum.    David [name] states the gabapentin does seem to help. He's also noticed that his anxiety gets worse when he is out of the medication.     Other   Associated symptoms include congestion (chronic, progressing), nausea and numbness. Pertinent negatives include no abdominal pain, chest pain, chills, fever, rash, sore throat, vomiting or weakness.   Back Pain   This is a chronic problem. The current episode started more than 1 year ago. The problem occurs constantly. The  problem has been gradually worsening since onset. The pain is present in the lumbar spine. The quality of the pain is described as shooting. The pain radiates to the left thigh (sometimes, mostly stays in the back). The pain is worse during the day. The symptoms are aggravated by bending. Associated symptoms include numbness, paresthesias (buring in upper and outer right thigh) and tingling (gets tingling pain in the tops of his thighs). Pertinent negatives include no abdominal pain, bladder incontinence, bowel incontinence, chest pain, dysuria, fever, pelvic pain, perianal numbness, weakness or weight loss. He has tried heat, analgesics, NSAIDs and muscle relaxant (copper back brace) for the symptoms. The treatment provided mild relief.   Abdominal Pain   This is a chronic problem. The current episode started more than 1 month  ago. The problem occurs intermittently. The problem has been gradually improving. The pain is located in the LUQ (has always been there, hx of gastric ulcer). Associated symptoms include diarrhea and nausea. Pertinent negatives include no constipation, dysuria, fever, frequency, hematuria, vomiting or weight loss. Nothing aggravates the pain. The pain is relieved by bowel movements. He has tried proton pump inhibitors for the symptoms. The treatment provided no relief. Prior diagnostic workup includes GI consult.     .    Review of Systems   Constitutional: Negative for chills, fever and weight loss.   HENT: Positive for congestion (chronic, progressing). Negative for sore throat and voice change.    Respiratory: Positive for choking (sometimes food feels stuck). Negative for chest tightness and shortness of breath.    Cardiovascular: Negative for chest pain.   Gastrointestinal: Positive for diarrhea and nausea. Negative for abdominal distention, abdominal pain, blood in stool, bowel incontinence, constipation and vomiting.   Genitourinary: Negative for bladder incontinence, decreased urine volume, difficulty urinating, dysuria, frequency, hematuria, pelvic pain and testicular pain.   Musculoskeletal: Positive for back pain. Negative for gait problem.   Skin: Negative for rash and wound.   Neurological: Positive for tingling (gets tingling pain in the tops of his thighs), numbness and paresthesias (buring in upper and outer right thigh). Negative for seizures and weakness.       Prior to Visit Medications    Medication Sig Taking? Authorizing Provider   gabapentin (NEURONTIN) 600 MG tablet TAKE 1 TAB BY MOUTH THREE TIMES DAILY Yes Curt Bears, APRN - CNP   omeprazole (PRILOSEC) 20 MG delayed release capsule Take 1 capsule by mouth every morning (before breakfast) Yes Curt Bears, APRN - CNP   fluticasone (FLONASE) 50 MCG/ACT nasal spray 2 sprays by Nasal route daily Yes Curt Bears, APRN - CNP    methadone 10 MG/5ML solution Take 45 mg by mouth daily  . Yes Historical Provider, MD        Social History     Tobacco Use   ??? Smoking status: Former Smoker     Packs/day: 1.00     Years: 5.00     Pack years: 5.00     Last attempt to quit: 09/16/2012     Years since quitting: 6.2   ??? Smokeless tobacco: Current User     Types: Chew   Substance Use Topics   ??? Alcohol use: Yes     Alcohol/week: 0.0 standard drinks        There were no vitals filed for this visit.  Estimated body mass index is 39.68 kg/m?? as calculated from the following:    Height as of 03/23/18: 5\' 8"  (1.727 m).  Weight as of 07/20/18: 261 lb (118.4 kg).    DIAGNOSTIC FINDINGS:  CBC:  Lab Results   Component Value Date    WBC 9.2 03/23/2016    HGB 15.6 03/23/2016    PLT 225 03/23/2016    PLT 203 03/31/2010       BMP:    Lab Results   Component Value Date    NA 143 03/23/2016    K 4.3 03/23/2016    CL 102 03/23/2016    CO2 27 03/23/2016    BUN 11 03/23/2016    CREATININE 0.88 03/23/2016    GLUCOSE 102 03/23/2016    GLUCOSE 96 03/31/2010       HEMOGLOBIN A1C: No results found for: LABA1C    FASTING LIPID PANEL:  Lab Results   Component Value Date    CHOL 143 02/21/2012    HDL 39 (L) 02/21/2012    TRIG 49 02/21/2012     Controlled Substance Monitoring:    Acute and Chronic Pain Monitoring:   RX Monitoring 01/25/2018   Attestation -   Periodic Controlled Substance Monitoring No signs of potential drug abuse or diversion identified.             ASSESSMENT     Diagnosis Orders   1. Chronic bilateral low back pain without sciatica  [facility]    gabapentin (NEURONTIN) 600 MG tablet   2. Obesity with body mass index 30 or greater     3. Opioid dependence in remission (HCC)     4. Abdominal pain, LUQ     5. Chronic sinusitis, unspecified location  [facility] - Adappa, Vijay, MD, Otolaryngology, [place]          PLAN:  Orders Placed This Encounter   Medications   ??? gabapentin (NEURONTIN) 600 MG tablet     Sig: TAKE 1 TAB BY MOUTH THREE TIMES  DAILY     Dispense:  90 tablet     Refill:  2         1. Pain does appear neuropathic in nature.  No worsening or progression of symptoms.  Guiseppe does state the pain has improved with light exercise at home.  I again reinforced and advised a course of physical therapy to treat musculoskeletal pain, I advised him it is possible that musculoskeletal tightness can cause nerve compression.  I again advised him an MRI at this time would not be attainable without a course of PT or progression of symptoms.  2. Ear nose and throat referral provided.  3. Siah continues to have abdominal complaints.  We again reviewed the previous findings of Barrett's esophagus.  He admits he has not called or followed up with a gastroenterologist.  Previous referral expired, new referral placed.  4. Continued current treatment with methadone.  Dontrel verbalizes a desire to wean off treatment this calendar year.    FOLLOW UP AND INSTRUCTIONS:  No follow-ups on file.    ?? Ozias received counseling on the following healthy behaviors:exercise and medication adherence    ?? Discussed use, benefit, and side effects of prescribed medications.  Barriers to  medication compliance addressed.  All patient questions answered.  Pt  verbalized understanding of all instructions given.    ?? Patient given educational materials - see patient instructions      ?? Patient advised to contact scheduling offices for any referrals or imaging orders  placed today if they have not been contacted in 48 hours.  No follow-ups on file.    An electronic signature was used to authenticate this note.    --Curt Bearsachel J Timotheus Salm, APRN - CNP on 12/13/2018 at 10:24 AM

## 2019-03-12 ENCOUNTER — Encounter

## 2019-03-12 MED ORDER — GABAPENTIN 600 MG PO TABS
600 MG | ORAL_TABLET | ORAL | 2 refills | Status: DC
Start: 2019-03-12 — End: 2019-06-19

## 2019-03-15 ENCOUNTER — Encounter: Attending: Family | Primary: Family

## 2019-06-15 ENCOUNTER — Encounter

## 2019-06-15 NOTE — Telephone Encounter (Signed)
Patients girlfriend Selena Batten, stated she came into the office on 06/13/2019 and requested patients medication  be refilled but it has not. Patient would like this refilled at [facility]. PH# [phone]  FX# [phone] .Patient can be reached at [phone]. Last OV 08/07/2018  Next VV 06/22/2019. Patients girlfriend stressed patient cannot go without his medication.

## 2019-06-18 ENCOUNTER — Encounter

## 2019-06-18 NOTE — Telephone Encounter (Signed)
Einar Pheasant called again stating David [name] needs Gabapentin refilled. He is completely out of his medication, he has virtual appointment with you 06/22/19 please advise      Health Maintenance   Topic Date Due   ??? Varicella vaccine (1 of 2 - 2-dose childhood series) 09/22/1985   ??? DTaP/Tdap/Td vaccine (1 - Tdap) 09/23/2003   ??? Flu vaccine (1) 03/20/2019   ??? HIV screen  Completed   ??? Hepatitis A vaccine  Aged Out   ??? Hepatitis B vaccine  Aged Out   ??? Hib vaccine  Aged Out   ??? Meningococcal (ACWY) vaccine  Aged Out   ??? Pneumococcal 0-64 years Vaccine  Aged Out             (applicable per patient's age: Cancer Screenings, Depression Screening, Fall Risk Screening, Immunizations)    LDL Cholesterol (mg/dL)   Date Value   02/21/2012 94     AST (U/L)   Date Value   03/23/2016 35     ALT (U/L)   Date Value   03/23/2016 47 (H)     BUN (mg/dL)   Date Value   03/23/2016 11      (goal A1C is < 7)   (goal LDL is <100) need 30-50% reduction from baseline     BP Readings from Last 3 Encounters:   07/20/18 137/86   03/23/18 121/85   11/08/17 125/88    (goal BP 120/80)      All Future Testing planned in [facility]:  Lab Frequency Next Occurrence       Next Visit Date:  Future Appointments   Date Time Provider Ada   06/22/2019  1:00 PM Danice Goltz, APRN - [place]            Patient Active Problem List:     Fatigue     Constipation     Chronic sinusitis     Migraine     Chronic bilateral low back pain without sciatica     Postnasal drip     Anxiety     Neck pain     Atopic rhinitis     Obesity with body mass index 30 or greater     Pneumonia of right lung due to infectious organism     Opioid dependence in remission (HCC)     Lower abdominal pain     GI bleed     Abdominal pain, LUQ     Functional diarrhea     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

## 2019-06-19 MED ORDER — GABAPENTIN 600 MG PO TABS
600 MG | ORAL_TABLET | ORAL | 2 refills | Status: DC
Start: 2019-06-19 — End: 2019-09-10

## 2019-06-19 NOTE — Telephone Encounter (Signed)
Left message for pt to call and schedule appt - 1st attempt.

## 2019-06-22 ENCOUNTER — Encounter: Payer: PRIVATE HEALTH INSURANCE | Attending: Family | Primary: Family

## 2019-07-10 ENCOUNTER — Emergency Department: Admit: 2019-07-11 | Payer: PRIVATE HEALTH INSURANCE | Primary: Family

## 2019-07-10 ENCOUNTER — Inpatient Hospital Stay
Admit: 2019-07-10 | Discharge: 2019-07-11 | Disposition: A | Discharge status: Home / Self Care | Payer: PRIVATE HEALTH INSURANCE | Attending: Emergency Medicine

## 2019-07-10 ENCOUNTER — Emergency Department: Admit: 2019-07-10 | Payer: PRIVATE HEALTH INSURANCE | Primary: Family

## 2019-07-10 DIAGNOSIS — S61432A Puncture wound without foreign body of left hand, initial encounter: Secondary | ICD-10-CM

## 2019-07-10 MED ORDER — MORPHINE SULFATE 4 MG/ML IJ SOLN
4 MG/ML | INTRAMUSCULAR | Status: DC
Start: 2019-07-10 — End: 2019-07-10

## 2019-07-10 MED ORDER — CEPHALEXIN 500 MG PO CAPS
500 MG | ORAL_CAPSULE | Freq: Two times a day (BID) | ORAL | 0 refills | Status: AC
Start: 2019-07-10 — End: 2019-07-17

## 2019-07-10 MED ORDER — TETANUS-DIPHTH-ACELL PERTUSSIS 5-2.5-18.5 LF-MCG/0.5 IM SUSP
5-2.5-18.5 LF-MCG/0.5 | Freq: Once | INTRAMUSCULAR | Status: AC
Start: 2019-07-10 — End: 2019-07-10
  Administered 2019-07-10: 23:00:00 0.5 mL via INTRAMUSCULAR

## 2019-07-10 MED ORDER — MORPHINE SULFATE 4 MG/ML IJ SOLN
4 MG/ML | INTRAMUSCULAR | Status: AC
Start: 2019-07-10 — End: 2019-07-10
  Administered 2019-07-10: 22:00:00 10 via INTRAMUSCULAR

## 2019-07-10 MED ORDER — STERILE WATER FOR INJECTION IJ SOLN
INTRAMUSCULAR | Status: DC
Start: 2019-07-10 — End: 2019-07-10

## 2019-07-10 MED ORDER — MORPHINE SULFATE 4 MG/ML IJ SOLN
4 MG/ML | Freq: Once | INTRAMUSCULAR | Status: AC
Start: 2019-07-10 — End: 2019-07-10

## 2019-07-10 MED ORDER — PROMETHAZINE HCL 25 MG/ML IJ SOLN
25 MG/ML | Freq: Once | INTRAMUSCULAR | Status: AC
Start: 2019-07-10 — End: 2019-07-10

## 2019-07-10 MED ORDER — CEFAZOLIN SODIUM 1 G IJ SOLR
1 g | Freq: Once | INTRAMUSCULAR | Status: AC
Start: 2019-07-10 — End: 2019-07-10
  Administered 2019-07-10: 1 g via INTRAMUSCULAR

## 2019-07-10 MED ORDER — FENTANYL CITRATE (PF) 100 MCG/2ML IJ SOLN
100 MCG/2ML | Freq: Once | INTRAMUSCULAR | Status: DC
Start: 2019-07-10 — End: 2019-07-10

## 2019-07-10 MED ORDER — ACETAMINOPHEN 500 MG PO TABS
500 MG | Freq: Once | ORAL | Status: AC
Start: 2019-07-10 — End: 2019-07-10
  Administered 2019-07-10: 1000 mg via ORAL

## 2019-07-10 MED ORDER — IBUPROFEN 800 MG PO TABS
800 MG | Freq: Once | ORAL | Status: AC
Start: 2019-07-10 — End: 2019-07-10
  Administered 2019-07-10: 800 mg via ORAL

## 2019-07-10 MED ORDER — PROMETHAZINE HCL 25 MG/ML IJ SOLN
25 MG/ML | INTRAMUSCULAR | Status: AC
Start: 2019-07-10 — End: 2019-07-10
  Administered 2019-07-10: 22:00:00 12.5 via INTRAMUSCULAR

## 2019-07-10 MED FILL — CEFAZOLIN SODIUM 1 G IJ SOLR: 1 g | INTRAMUSCULAR | Qty: 1000

## 2019-07-10 MED FILL — PHENERGAN 25 MG/ML IJ SOLN: 25 mg/mL | INTRAMUSCULAR | Qty: 1

## 2019-07-10 MED FILL — MORPHINE SULFATE 4 MG/ML IJ SOLN: 4 mg/mL | INTRAMUSCULAR | Qty: 1

## 2019-07-10 MED FILL — IBUPROFEN 800 MG PO TABS: 800 mg | ORAL | Qty: 1

## 2019-07-10 MED FILL — STERILE WATER FOR INJECTION IJ SOLN: INTRAMUSCULAR | Qty: 10

## 2019-07-10 MED FILL — MAPAP 500 MG PO TABS: 500 mg | ORAL | Qty: 2

## 2019-07-10 MED FILL — BOOSTRIX 5-2.5-18.5 LF-MCG/0.5 IM SUSP: 5-2.5-18.5 LF-MCG/0.5 | INTRAMUSCULAR | Qty: 0.5

## 2019-07-10 MED FILL — MORPHINE SULFATE 4 MG/ML IJ SOLN: 4 mg/mL | INTRAMUSCULAR | Qty: 2
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
## 2019-07-10 NOTE — ED Notes (Signed)
Ortho placing splint at this time. Will d/c patient after completed.      Araceli Bouche, RN  07/10/19 [phone]
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
## 2019-07-10 NOTE — ED Notes (Signed)
Xray at bedside.      Araceli Bouche, RN  07/10/19 [phone]
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
## 2019-07-10 NOTE — ED Notes (Signed)
pts hand placed in iodine mixture to clean off hand.      Araceli Bouche, RN  07/10/19 [phone]

## 2019-07-10 NOTE — ED Notes (Signed)
Dr. Letitia Libra at bedside to assess patient.      Araceli Bouche, RN  07/10/19 [phone]
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
## 2019-07-10 NOTE — ED Notes (Signed)
Okay to discharge per ortho resident. Pt to follow up with ortho     Randell Loop, RN  07/10/19 2021
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
## 2019-07-10 NOTE — ED Notes (Signed)
Ortho at bedside to see patient.      Araceli Bouche, RN  07/10/19 [phone]
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
## 2019-08-24 ENCOUNTER — Encounter: Payer: PRIVATE HEALTH INSURANCE | Attending: Family | Primary: Family

## 2019-09-07 ENCOUNTER — Encounter

## 2019-09-07 IMAGING — DX DG ORBITS FOR FOREIGN BODY
2 series · 2 of 2 positions shown · non-contrast
Comparison: None.

CLINICAL DATA: Metal working/exposure; clearance prior to MRI

EXAM:
ORBITS FOR FOREIGN BODY - 2 VIEW

[orbits waters (1 of 2)]
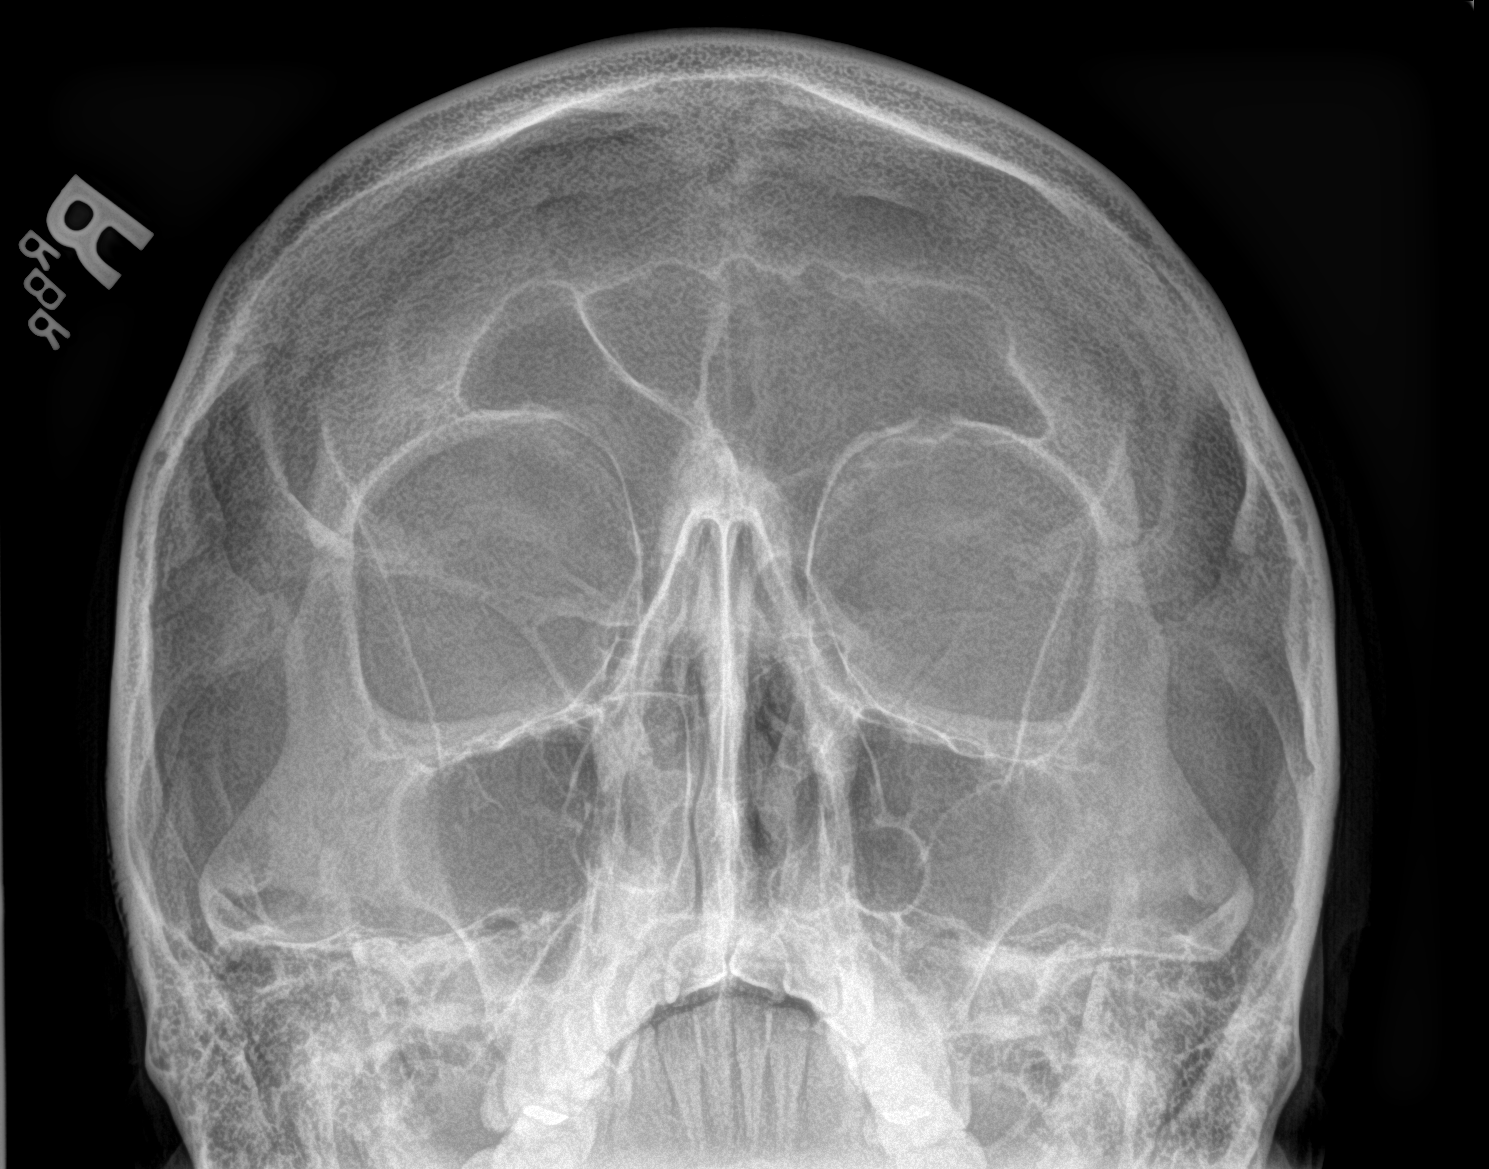

[orbits waters (2 of 2)]
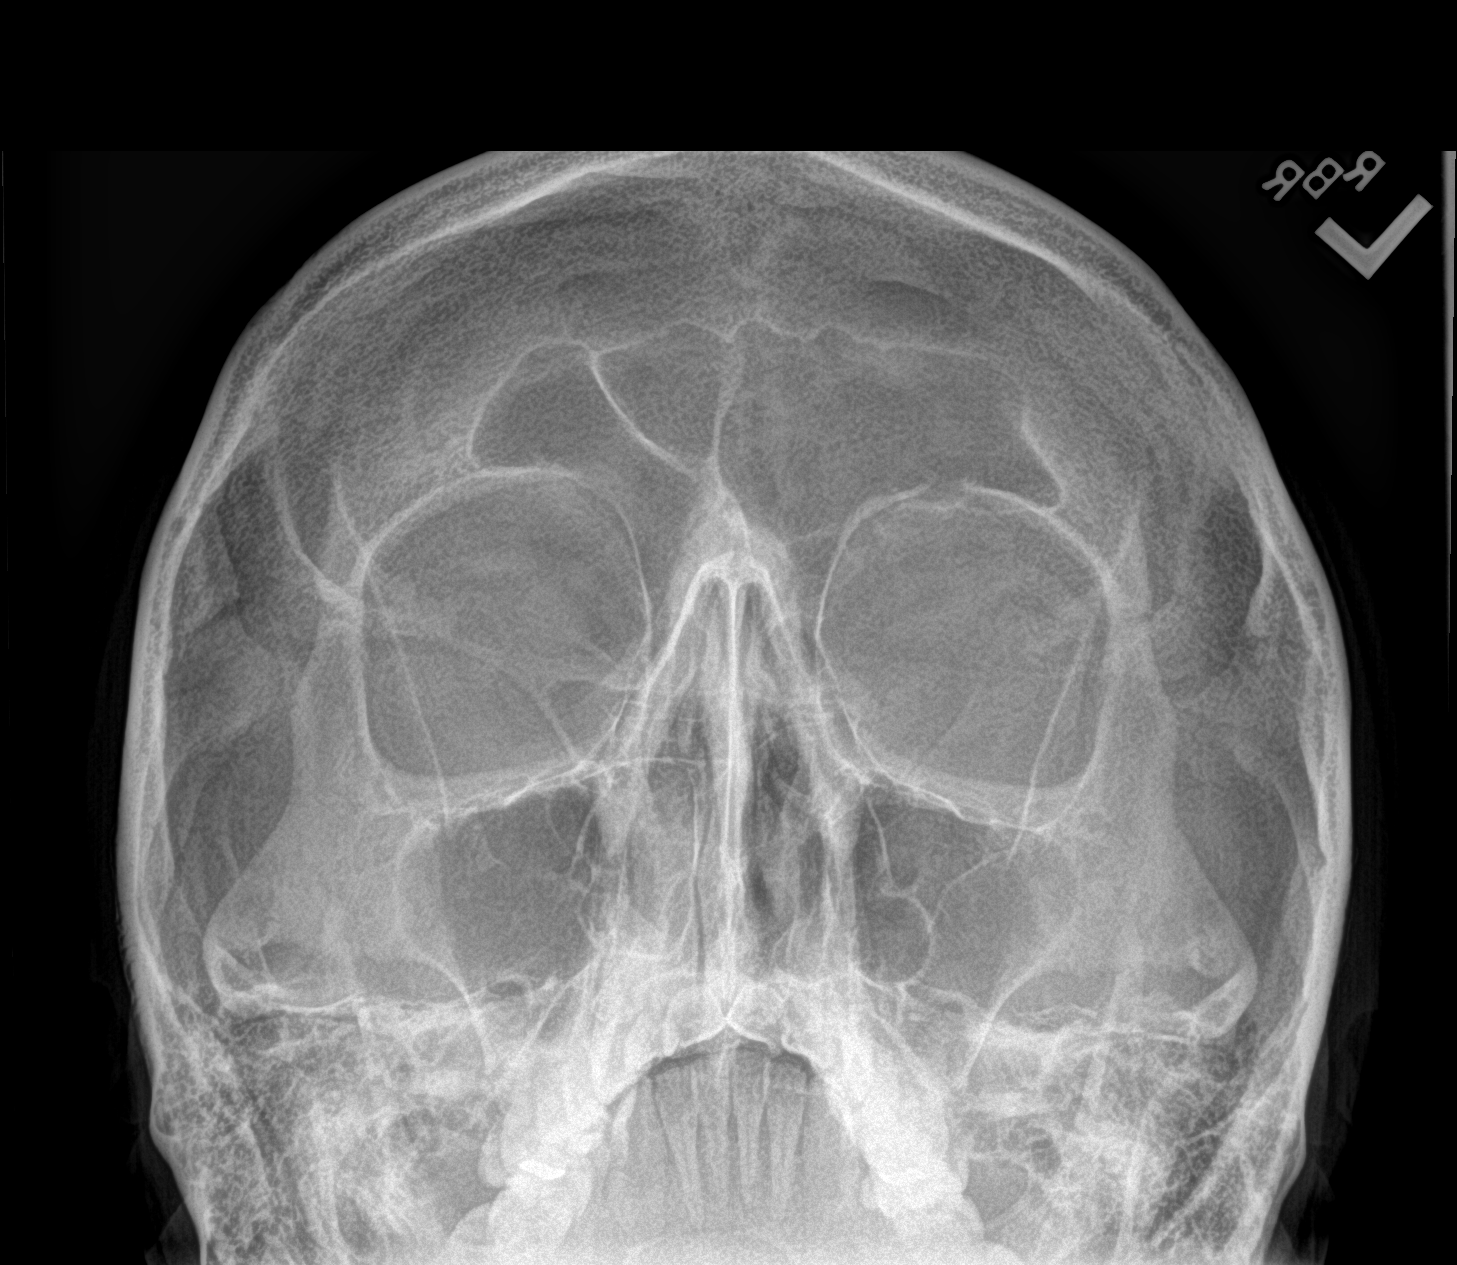

[2 of 2 positions shown; findings below may reference images not displayed]

FINDINGS: There is no evidence of metallic foreign body within the orbits. No
significant bone abnormality identified.
IMPRESSION: No evidence of metallic foreign body within the orbits.

## 2019-09-07 IMAGING — MR MR KNEE*R* W/O CM
6 series · 40 of 40 positions shown · non-contrast
Comparison: None.

CLINICAL DATA: Posterior right knee pain which began while weight
lifting. History of aspiration and injection of a Baker's cyst.
Symptoms are chronic.

EXAM:
MRI OF THE RIGHT KNEE WITHOUT CONTRAST
TECHNIQUE: Multiplanar, multisequence MR imaging of the knee was performed. No
intravenous contrast was administered.

[Series 3: PD fat-sat · axial · 3.0mm · 0.33mm/px · z∈[-65,+48]mm · 7 of 35 slices shown (1 of 4)]
[im 1/35]
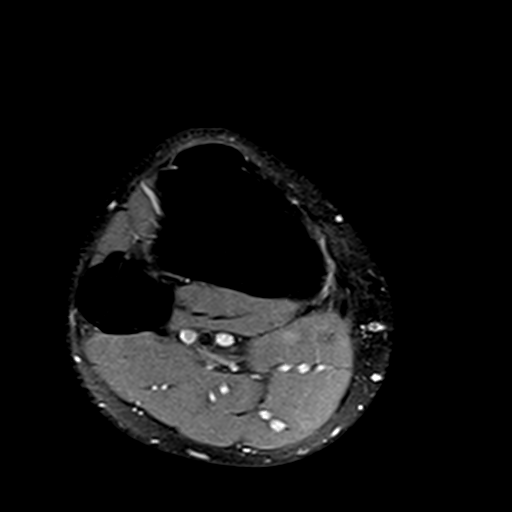
[im 6/35]
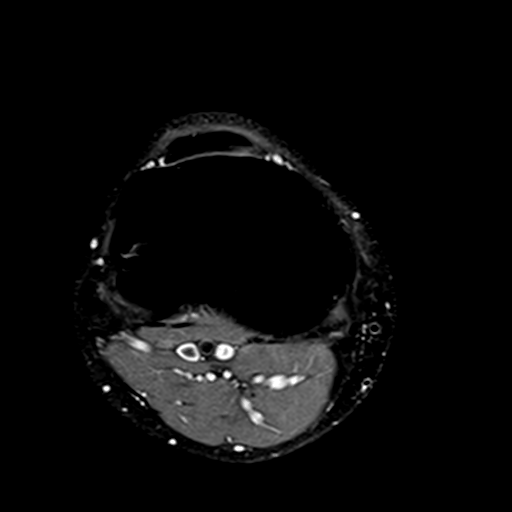
[im 12/35]
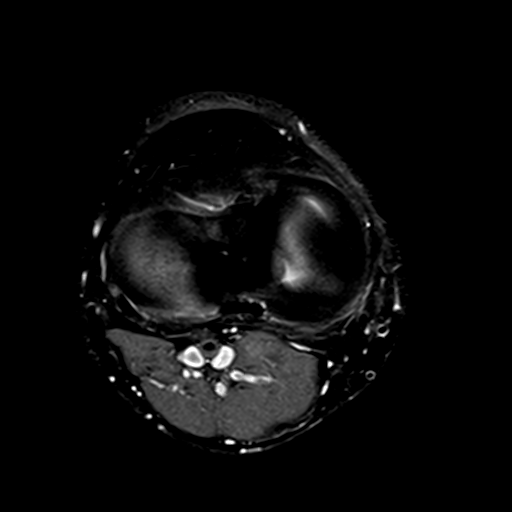
[im 18/35]
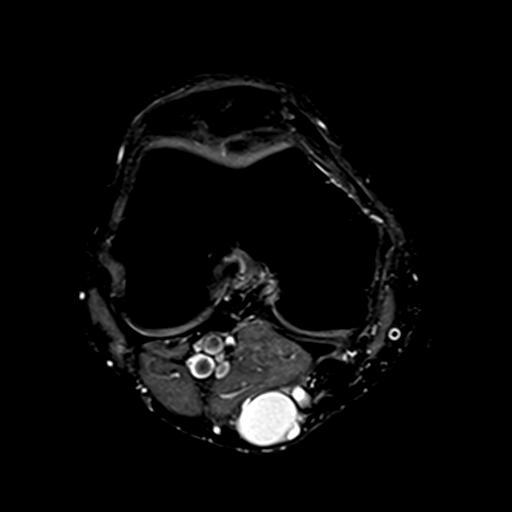
[im 23/35]
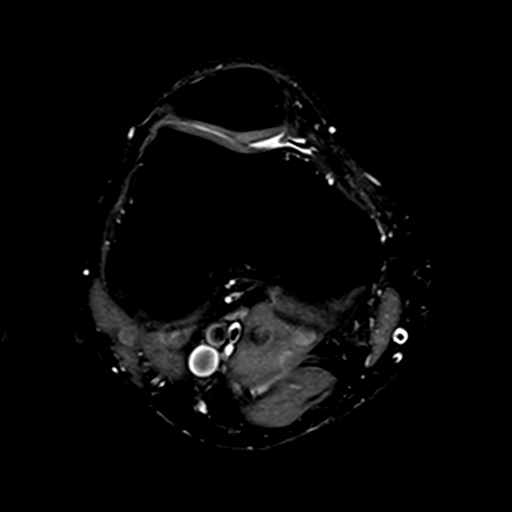
[im 29/35]
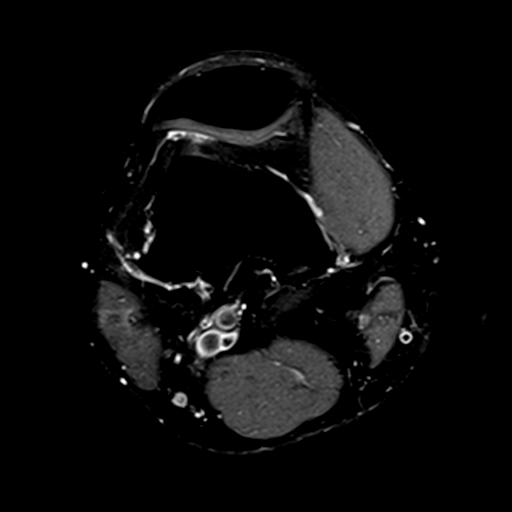
[im 35/35]
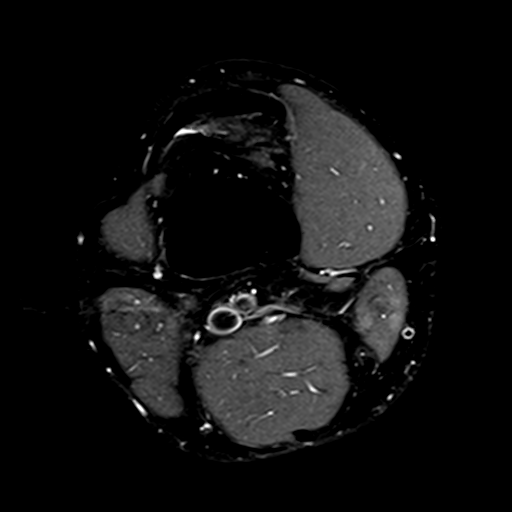

[Series 4: T1 · coronal · 3.0mm · 0.50mm/px · 7 of 32 slices shown]
[im 1/32]
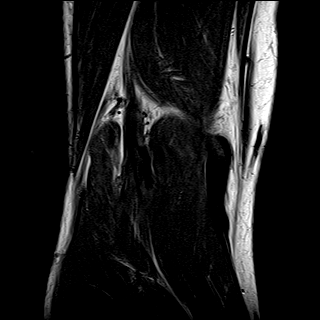
[im 6/32]
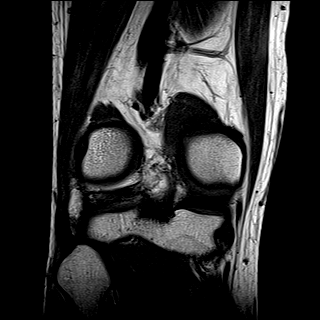
[im 11/32]
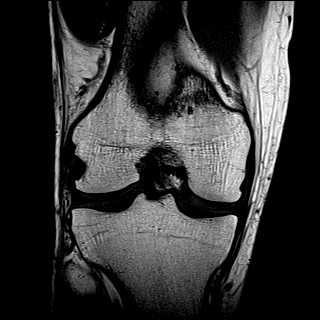
[im 16/32]
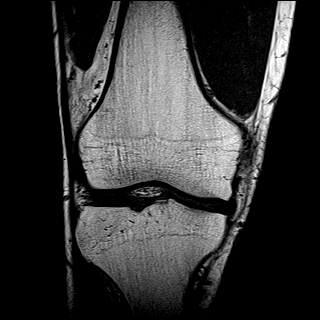
[im 21/32]
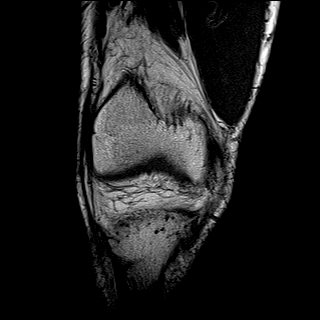
[im 26/32]
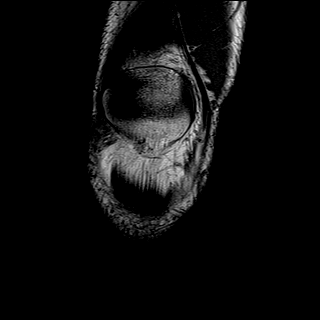
[im 32/32]
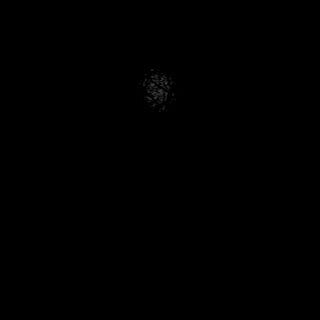

[Series 5: T2 fat-sat · coronal · 3.0mm · 0.50mm/px · 7 of 32 slices shown]
[im 1/32]
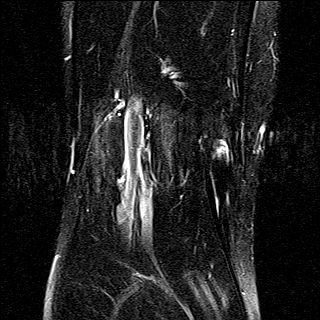
[im 6/32]
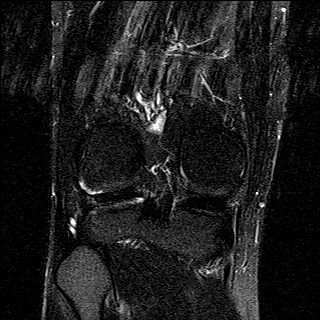
[im 11/32]
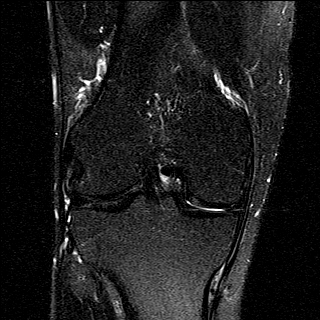
[im 16/32]
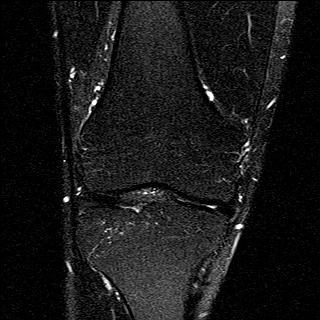
[im 21/32]
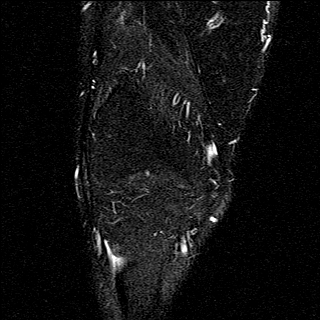
[im 26/32]
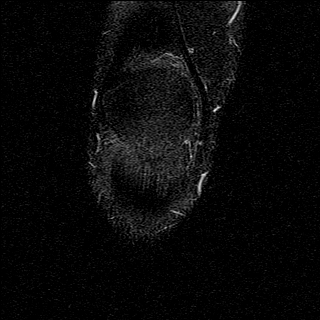
[im 32/32]
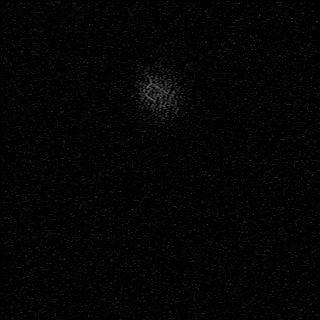

[Series 6: PD fat-sat · coronal · 3.0mm · 0.62mm/px · 7 of 32 slices shown (2 of 4)]
[im 1/32]
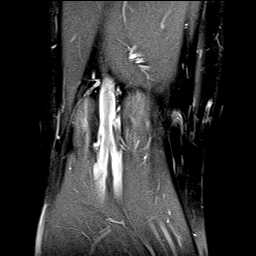
[im 6/32]
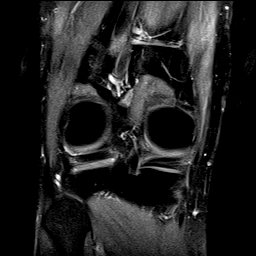
[im 11/32]
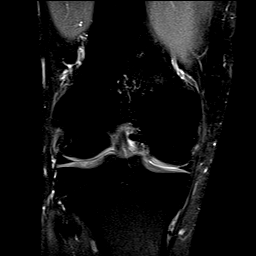
[im 16/32]
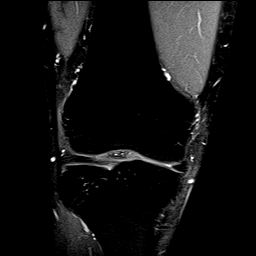
[im 21/32]
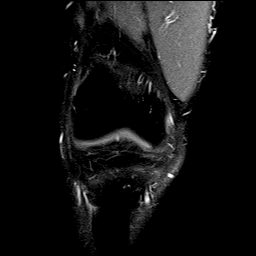
[im 26/32]
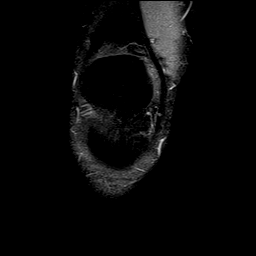
[im 32/32]
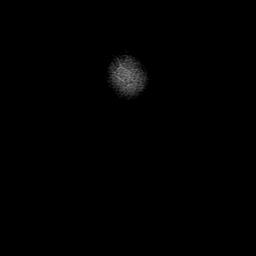

[Series 7: PD fat-sat · sagittal · 3.0mm · 0.62mm/px · 8 of 35 slices shown (3 of 4)]
[im 1/35]
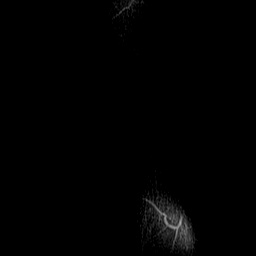
[im 5/35]
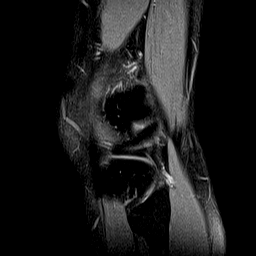
[im 10/35]
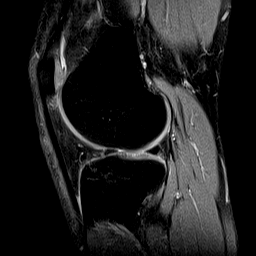
[im 15/35]
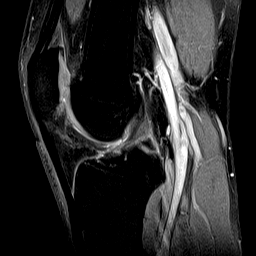
[im 20/35]
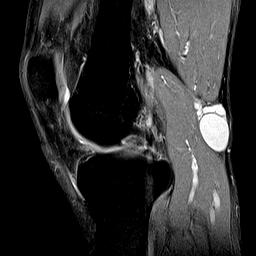
[im 25/35]
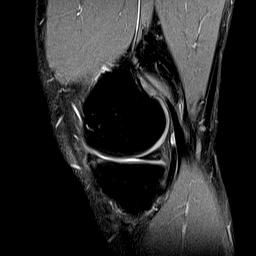
[im 30/35]
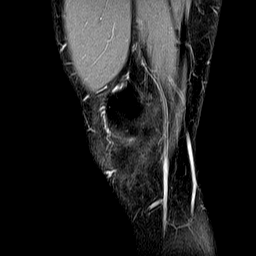
[im 35/35]
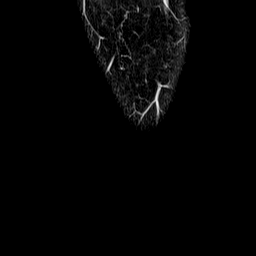

[Series 8: PD fat-sat · coronal · 2.0mm · 0.62mm/px · 4 of 19 slices shown (4 of 4)]
[im 1/19]
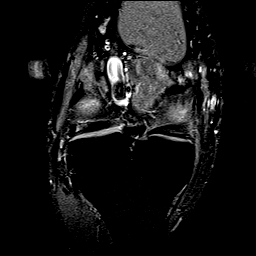
[im 7/19]
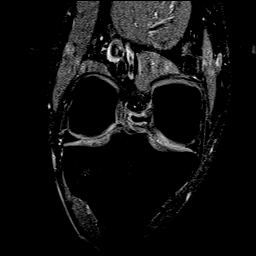
[im 13/19]
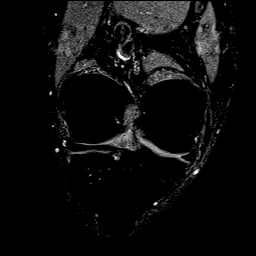
[im 19/19]
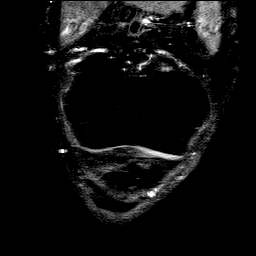

[40 of 40 positions shown; findings below may reference images not displayed]

FINDINGS: MENISCI

Medial meniscus: Intact. There is some intrasubstance degenerative
signal in the posterior horn.

Lateral meniscus:  Intact.

LIGAMENTS

Cruciates:  Intact.

Collaterals:  Intact.

CARTILAGE

Patellofemoral:  Normal.

Medial:  Minimally degenerated.

Lateral:  Minimally degenerated.

Joint:  No effusion.

Popliteal Fossa: The patient has a multi-septated Baker's cyst. Bulk
of the cyst is posterior to the medial gastrocnemius and creates
mass effect on the gastrocnemius. The main component of the cyst is
immediately beneath the skin surface. The cyst measures
approximately 2.6 cm transverse by 2.0 cm AP by 2.9 cm craniocaudal.

Extensor Mechanism:  Intact.

Bones:  No fracture or worrisome lesion.

Other: None.
IMPRESSION: Septated Baker's cyst. The bulk of the cyst is immediately beneath
the skin surface. The cyst results in mass effect on the medial head
of the gastrocnemius.

Negative for meniscal or ligament tear.

Mild hyaline cartilage degeneration medial and lateral compartments.

## 2019-09-07 NOTE — Telephone Encounter (Signed)
Health Maintenance   Topic Date Due   ??? Varicella vaccine (1 of 2 - 2-dose childhood series) 09/22/1985   ??? Pneumococcal 0-64 years Vaccine (1 of 1 - PPSV23) 09/23/1990   ??? Flu vaccine (1) 03/20/2019   ??? DTaP/Tdap/Td vaccine (2 - Td) 07/09/2029   ??? Hepatitis C screen  Completed   ??? HIV screen  Completed   ??? Hepatitis A vaccine  Aged Out   ??? Hepatitis B vaccine  Aged Out   ??? Hib vaccine  Aged Out   ??? Meningococcal (ACWY) vaccine  Aged Out             (applicable per patient's age: Cancer Screenings, Depression Screening, Fall Risk Screening, Immunizations)    LDL Cholesterol (mg/dL)   Date Value   61/44/3154 94     AST (U/L)   Date Value   03/23/2016 35     ALT (U/L)   Date Value   03/23/2016 47 (H)     BUN (mg/dL)   Date Value   00/86/7619 11      (goal A1C is < 7)   (goal LDL is <100) need 30-50% reduction from baseline     BP Readings from Last 3 Encounters:   07/10/19 (!) 143/88   07/20/18 137/86   03/23/18 121/85    (goal BP 120/80)      All Future Testing planned in [facility]:  Lab Frequency Next Occurrence       Next Visit Date:  Future Appointments   Date Time Provider Department Center   09/12/2019  1:30 PM Curt Bears, APRN - CNP [facility]            Patient Active Problem List:     Fatigue     Constipation     Chronic sinusitis     Migraine     Chronic bilateral low back pain without sciatica     Postnasal drip     Anxiety     Neck pain     Atopic rhinitis     Obesity with body mass index 30 or greater     Pneumonia of right lung due to infectious organism     Opioid dependence in remission (HCC)     Lower abdominal pain     GI bleed     Abdominal pain, LUQ     Functional diarrhea     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

## 2019-09-10 ENCOUNTER — Ambulatory Visit: Admit: 2019-09-10 | Discharge: 2019-09-10 | Payer: PRIVATE HEALTH INSURANCE | Attending: Family | Primary: Family

## 2019-09-10 DIAGNOSIS — G4719 Other hypersomnia: Secondary | ICD-10-CM

## 2019-09-10 MED ORDER — GABAPENTIN 600 MG PO TABS
600 MG | ORAL_TABLET | ORAL | 2 refills | Status: DC
Start: 2019-09-10 — End: 2019-12-10

## 2019-09-12 NOTE — Telephone Encounter (Signed)
1st attempt-Writer attempted to contact patient twice, but line rang busy.     2nd attempt-mailed letter to patient's home

## 2019-10-11 NOTE — Telephone Encounter (Signed)
3rd attempt-writer attempted to contact patient but there was no answer and VM is no set up to leave message

## 2019-11-19 ENCOUNTER — Encounter: Payer: PRIVATE HEALTH INSURANCE | Attending: Family | Primary: Family

## 2019-12-10 ENCOUNTER — Telehealth

## 2019-12-10 MED ORDER — GABAPENTIN 600 MG PO TABS
600 MG | ORAL_TABLET | ORAL | 2 refills | Status: DC
Start: 2019-12-10 — End: 2020-02-27

## 2019-12-23 ENCOUNTER — Encounter

## 2020-02-06 ENCOUNTER — Encounter

## 2020-02-27 ENCOUNTER — Emergency Department: Admit: 2020-02-27 | Payer: PRIVATE HEALTH INSURANCE | Primary: Family

## 2020-02-27 ENCOUNTER — Inpatient Hospital Stay
Admit: 2020-02-27 | Discharge: 2020-02-28 | Disposition: A | Discharge status: Home / Self Care | Payer: PRIVATE HEALTH INSURANCE | Attending: Emergency Medicine

## 2020-02-27 DIAGNOSIS — R1030 Lower abdominal pain, unspecified: Secondary | ICD-10-CM

## 2020-02-27 LAB — CBC WITH AUTO DIFFERENTIAL
Absolute Eos #: 0.16 10*3/uL (ref 0.00–0.44)
Absolute Immature Granulocyte: 0.02 10*3/uL (ref 0.00–0.30)
Absolute Lymph #: 1.82 10*3/uL (ref 1.10–3.70)
Absolute Mono #: 0.55 10*3/uL (ref 0.10–1.20)
Basophils Absolute: <0.03 10*3/uL (ref 0.00–0.20)
Basophils: 0 % (ref 0–2)
Eosinophils %: 2 % (ref 1–4)
Hematocrit: 42.3 % (ref 40.7–50.3)
Hemoglobin: 14.1 g/dL (ref 13.0–17.0)
Immature Granulocytes: 0 %
Lymphocytes: 26 % (ref 24–43)
MCH: 29.1 pg (ref 25.2–33.5)
MCHC: 33.3 g/dL (ref 28.4–34.8)
MCV: 87.4 fL (ref 82.6–102.9)
MPV: 10.9 fL (ref 8.1–13.5)
Monocytes: 8 % (ref 3–12)
NRBC Automated: 0 per 100 WBC
Platelets: 196 10*3/uL (ref 138–453)
RBC: 4.84 m/uL (ref 4.21–5.77)
RDW: 12.3 % (ref 11.8–14.4)
Seg Neutrophils: 64 % (ref 36–65)
Segs Absolute: 4.57 10*3/uL (ref 1.50–8.10)
WBC: 7.1 10*3/uL (ref 3.5–11.3)

## 2020-02-27 LAB — COMPREHENSIVE METABOLIC PANEL W/ REFLEX TO MG FOR LOW K
ALT: 16 U/L (ref 5–41)
AST: 20 U/L (ref <40)
Albumin: 4.8 g/dL (ref 3.5–5.2)
Alkaline Phosphatase: 86 U/L (ref 40–129)
Anion Gap: 15 mmol/L (ref 9–17)
BUN: 14 mg/dL (ref 6–20)
Bun/Cre Ratio: 19 (ref 9–20)
CO2: 23 mmol/L (ref 20–31)
Calcium: 9.5 mg/dL (ref 8.6–10.4)
Chloride: 98 mmol/L (ref 98–107)
Creatinine: 0.74 mg/dL (ref 0.70–1.20)
GFR African American: >60 mL/min (ref >60)
GFR Non-African American: >60 mL/min (ref >60)
Glucose: 77 mg/dL (ref 70–99)
Potassium: 3.9 mmol/L (ref 3.7–5.3)
Sodium: 136 mmol/L (ref 135–144)
Total Bilirubin: 0.66 mg/dL (ref 0.3–1.2)
Total Protein: 7.7 g/dL (ref 6.4–8.3)

## 2020-02-27 LAB — MICROSCOPIC URINALYSIS
Crystals, UA: 2 /HPF — AB
Epithelial Cells UA: 0 /HPF (ref 0–5)
RBC, UA: 2 /HPF (ref 0–2)
WBC, UA: 0 /HPF (ref 0–5)

## 2020-02-27 LAB — URINALYSIS WITH REFLEX TO CULTURE
Glucose, Ur: NEGATIVE
Leukocyte Esterase, Urine: NEGATIVE
Nitrite, Urine: NEGATIVE
Specific Gravity, UA: 1.058 — ABNORMAL HIGH (ref 1.005–1.030)
Urobilinogen, Urine: NORMAL
pH, UA: 6 (ref 5.0–8.0)

## 2020-02-27 LAB — LIPASE: Lipase: 20 U/L (ref 13–60)

## 2020-02-27 MED ORDER — SODIUM CHLORIDE 0.9 % IV BOLUS
0.9 % | Freq: Once | INTRAVENOUS | Status: AC
Start: 2020-02-27 — End: 2020-02-27
  Administered 2020-02-27: 23:00:00 80 mL via INTRAVENOUS

## 2020-02-27 MED ORDER — METOCLOPRAMIDE HCL 5 MG/ML IJ SOLN
5 MG/ML | Freq: Once | INTRAMUSCULAR | Status: AC
Start: 2020-02-27 — End: 2020-02-27
  Administered 2020-02-27: 22:00:00 10 mg via INTRAVENOUS

## 2020-02-27 MED ORDER — NORMAL SALINE FLUSH 0.9 % IV SOLN
0.9 % | Freq: Once | INTRAVENOUS | Status: AC
Start: 2020-02-27 — End: 2020-02-27
  Administered 2020-02-27: 23:00:00 10 mL via INTRAVENOUS

## 2020-02-27 MED ORDER — IOPAMIDOL 76 % IV SOLN
76 % | Freq: Once | INTRAVENOUS | Status: AC | PRN
Start: 2020-02-27 — End: 2020-02-27
  Administered 2020-02-27: 23:00:00 75 mL via INTRAVENOUS

## 2020-02-27 MED FILL — METOCLOPRAMIDE HCL 5 MG/ML IJ SOLN: 5 mg/mL | INTRAMUSCULAR | Qty: 2

## 2020-02-27 NOTE — ED Notes (Signed)
Report to Kassidy D RN     Ranvir Renovato Anne Kristalyn Bergstresser, RN  02/27/20 1852

## 2020-02-27 NOTE — ED Notes (Signed)
Pt presents to ED ambulatory with steady gait c/o of right and left sided abd pain, bilateral back pain, and headache. Pt states this has been ongoing for 4-5 weeks. Pt states his symptoms seem to get worse when he is home. Pt states he lives in his mother's house and she has been experiencing similar symptoms. Pt also states nausea. Pt denies vomiting, but states he did dry heave a few times today. Pt states he thinks nothing has come up because he hasn't eaten much recently. Pt states he has been drinking fluids though. Pt denies chest pain or SOB. Respirations are even and non-labored. Skin is warm and dry. Pt lungs clear bilateral. Pt denies diarrhea or constipation. Pt states his last BM was 2 days ago. Pt denies urinary symptoms. Pt denies fever, chills, or recent illness. Pt does has pain on palpation of abd and flank.      Cheron Coryell Nat Math, RN  02/27/20 1758
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
## 2020-02-28 MED ORDER — DICYCLOMINE HCL 10 MG PO CAPS
10 MG | ORAL_CAPSULE | Freq: Three times a day (TID) | ORAL | 3 refills | Status: DC | PRN
Start: 2020-02-28 — End: 2020-03-28

## 2020-03-07 ENCOUNTER — Encounter

## 2020-03-07 MED ORDER — GABAPENTIN 600 MG PO TABS
600 MG | ORAL_TABLET | ORAL | 0 refills | Status: DC
Start: 2020-03-07 — End: 2020-03-28

## 2020-03-07 NOTE — Telephone Encounter (Signed)
No results found for: LABA1C          ( goal A1C is < 7)   No results found for: LABMICR  LDL Cholesterol (mg/dL)   Date Value   09/62/8366 94       (goal LDL is <100)   AST (U/L)   Date Value   02/27/2020 20     ALT (U/L)   Date Value   02/27/2020 16     BUN (mg/dL)   Date Value   29/47/6546 14     BP Readings from Last 3 Encounters:   09/10/19 136/82   07/10/19 (!) 143/88   07/20/18 137/86          (goal 120/80)        Patient Active Problem List:     Fatigue     Constipation     Chronic sinusitis     Migraine     Chronic bilateral low back pain without sciatica     Postnasal drip     Anxiety     Neck pain     Atopic rhinitis     Obesity with body mass index 30 or greater     Pneumonia of right lung due to infectious organism     Opioid dependence in remission (HCC)     Lower abdominal pain     GI bleed     Abdominal pain, LUQ     Functional diarrhea     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      ----Dorma Russell

## 2020-03-28 ENCOUNTER — Encounter

## 2020-03-28 ENCOUNTER — Ambulatory Visit: Admit: 2020-03-28 | Discharge: 2020-03-28 | Payer: PRIVATE HEALTH INSURANCE | Attending: Family | Primary: Family

## 2020-03-28 DIAGNOSIS — G8929 Other chronic pain: Secondary | ICD-10-CM

## 2020-03-28 MED ORDER — GABAPENTIN 600 MG PO TABS
600 MG | ORAL_TABLET | ORAL | 3 refills | Status: DC
Start: 2020-03-28 — End: 2020-08-05

## 2020-03-28 NOTE — Telephone Encounter (Signed)
Health Maintenance   Topic Date Due   ??? Varicella vaccine (1 of 2 - 2-dose childhood series) Never done   ??? COVID-19 Vaccine (1) Never done   ??? Flu vaccine (1) Never done   ??? DTaP/Tdap/Td vaccine (2 - Td or Tdap) 07/09/2029   ??? Hepatitis C screen  Completed   ??? HIV screen  Completed   ??? Hepatitis A vaccine  Aged Out   ??? Hepatitis B vaccine  Aged Out   ??? Hib vaccine  Aged Out   ??? Meningococcal (ACWY) vaccine  Aged Out   ??? Pneumococcal 0-64 years Vaccine  Aged Out             (applicable per patient's age: Cancer Screenings, Depression Screening, Fall Risk Screening, Immunizations)    LDL Cholesterol (mg/dL)   Date Value   22/08/5425 94     AST (U/L)   Date Value   02/27/2020 20     ALT (U/L)   Date Value   02/27/2020 16     BUN (mg/dL)   Date Value   01/09/7627 14      (goal A1C is < 7)   (goal LDL is <100) need 30-50% reduction from baseline     BP Readings from Last 3 Encounters:   09/10/19 136/82   07/10/19 (!) 143/88   07/20/18 137/86    (goal BP 120/80)      All Future Testing planned in [facility]:  Lab Frequency Next Occurrence   TSH With Reflex Ft4 Once 09/10/2019   Baseline Diagnostic Sleep Study Once 09/10/2019   Magnesium Once 09/10/2019   Vitamin B12 Once 09/10/2019       Next Visit Date:  No future appointments.         Patient Active Problem List:     Fatigue     Constipation     Chronic sinusitis     Migraine     Chronic bilateral low back pain without sciatica     Postnasal drip     Anxiety     Neck pain     Atopic rhinitis     Obesity with body mass index 30 or greater     Pneumonia of right lung due to infectious organism     Opioid dependence in remission [facility])     Lower abdominal pain     GI bleed     Abdominal pain, LUQ     Functional diarrhea     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

## 2020-07-28 ENCOUNTER — Encounter: Attending: Family | Primary: Family

## 2020-08-05 ENCOUNTER — Encounter

## 2020-08-05 MED ORDER — GABAPENTIN 600 MG PO TABS
600 MG | ORAL_TABLET | ORAL | 1 refills | Status: DC
Start: 2020-08-05 — End: 2020-09-29

## 2020-08-05 NOTE — Telephone Encounter (Addendum)
Patient has appointment schedule but need medication refilled before then. family had covid  Please advised.     Call [phone]

## 2020-08-08 ENCOUNTER — Encounter: Attending: Family | Primary: Family

## 2020-09-29 ENCOUNTER — Ambulatory Visit: Admit: 2020-09-29 | Discharge: 2020-09-29 | Payer: PRIVATE HEALTH INSURANCE | Attending: Family | Primary: Family

## 2020-09-29 DIAGNOSIS — G8929 Other chronic pain: Secondary | ICD-10-CM

## 2020-09-29 DIAGNOSIS — M545 Low back pain, unspecified: Secondary | ICD-10-CM

## 2020-09-29 MED ORDER — GABAPENTIN 600 MG PO TABS
600 MG | ORAL_TABLET | ORAL | 2 refills | Status: DC
Start: 2020-09-29 — End: 2020-12-25

## 2020-09-29 NOTE — Progress Notes (Signed)
[facility]  [facility]  [address]  [facility]ULAT[facility]ARE [address] [place] [place] [address]  Dept: [phone]  Dept Fax: [phone]    Patient Care Team:  Curt Bears, APRN - CNP as PCP - General (Family Medicine)  Curt Bears, APRN - CNP as PCP - [facility] Empaneled Provider  [facility]  Corky Mull, MD as Consulting Physician (Gastroenterology)    09/29/2020     David [name] (DOB:  02/24/1986)is a 35 y.o. male, here for evaluation of the following medical concerns:   Chief Complaint   Patient presents with   ??? Follow-up   ??? 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   Has more energy  Working out a few days a week    Feels he still does need the gabapentin.  Pain has been better with weight loss  Patient denies bowel or bladder dysfunction, no perianal numbness.          .    Review of Systems   Constitutional: Negative for appetite change, fever and unexpected weight change.   HENT: Negative for hearing loss and trouble swallowing.    Eyes: Negative for visual disturbance.   Respiratory: Negative for shortness of breath and wheezing.    Cardiovascular: Negative for chest pain and palpitations.   Gastrointestinal: Negative for abdominal pain, blood in stool, diarrhea and vomiting.   Endocrine: Negative for polydipsia and polyuria.   Genitourinary: Negative for frequency and hematuria.   Musculoskeletal: Positive for back pain. Negative for myalgias and neck pain.   Neurological: Negative for dizziness, seizures, numbness and headaches.   Psychiatric/Behavioral: Negative for suicidal ideas. The patient is not nervous/anxious.        Prior to Visit Medications    Medication Sig Taking? Authorizing Provider   gabapentin (NEURONTIN) 600 MG tablet TAKE 1 TAB BY MOUTH THREE TIMES DAILY Yes Curt Bears, APRN - CNP        Social History     Tobacco Use   ??? Smoking status: Former  Smoker     Packs/day: 1.00     Years: 5.00     Pack years: 5.00     Quit date: 09/16/2012     Years since quitting: 8.0   ??? Smokeless tobacco: Current User     Types: Chew   Substance Use Topics   ??? Alcohol use: Yes     Alcohol/week: 0.0 standard drinks        Vitals:    09/29/20 1625   BP: 119/74   Site: Left Upper Arm   Position: Sitting   Pulse: 87   Temp: 98 ??F (36.7 ??C)   TempSrc: Temporal   SpO2: 96%   Weight: 175 lb (79.4 kg)   Height: 5\' 8"  (1.727 m)     Estimated body mass index is 26.61 kg/m?? as calculated from the following:    Height as of this encounter: 5\' 8"  (1.727 m).    Weight as of this encounter: 175 lb (79.4 kg).    DIAGNOSTIC FINDINGS:  CBC:  Lab Results   Component Value Date    WBC 7.1 02/27/2020    HGB 14.1 02/27/2020    PLT 196 02/27/2020    PLT 203 03/31/2010       BMP:    Lab Results  Component Value Date    NA 136 02/27/2020    K 3.9 02/27/2020    CL 98 02/27/2020    CO2 23 02/27/2020    BUN 14 02/27/2020    CREATININE 0.74 02/27/2020    GLUCOSE 77 02/27/2020    GLUCOSE 96 03/31/2010       HEMOGLOBIN A1C: No results found for: LABA1C    FASTING LIPID PANEL:  Lab Results   Component Value Date    CHOL 143 02/21/2012    HDL 39 (L) 02/21/2012    TRIG 49 02/21/2012       Physical Exam  Vitals and nursing note reviewed.   Constitutional:       General: He is not in acute distress.     Appearance: He is well-developed.   HENT:      Head: Normocephalic and atraumatic.      Right Ear: External ear normal.      Left Ear: External ear normal.      Mouth/Throat:      Mouth: Mucous membranes are moist.   Eyes:      General: No scleral icterus.     Pupils: Pupils are equal, round, and reactive to light.   Cardiovascular:      Rate and Rhythm: Normal rate and regular rhythm.   Pulmonary:      Effort: Pulmonary effort is normal.      Breath sounds: Normal breath sounds.   Abdominal:      General: Bowel sounds are normal.      Palpations: Abdomen is soft. There is no mass.   Musculoskeletal:          General: Tenderness present.      Cervical back: Normal range of motion and neck supple.      Lumbar back: Tenderness (left paraspinals only) present.        Back:    Skin:     General: Skin is warm and dry.   Neurological:      General: No focal deficit present.      Mental Status: He is alert and oriented to person, place, and time. Mental status is at baseline.      Coordination: Coordination normal.      Gait: Gait normal.   Psychiatric:         Behavior: Behavior normal. Behavior is cooperative.         Thought Content: Thought content normal.         Judgment: Judgment normal.         ASSESSMENT     Diagnosis Orders   1. Chronic bilateral low back pain without sciatica  gabapentin (NEURONTIN) 600 MG tablet   2. Tobacco dipper            PLAN:  Orders Placed This Encounter   Medications   ??? gabapentin (NEURONTIN) 600 MG tablet     Sig: TAKE 1 TAB BY MOUTH THREE TIMES DAILY     Dispense:  90 tablet     Refill:  2         1. May continue gabapentin 600 mg 3 times a day for control of chronic low back pain.    FOLLOW UP AND INSTRUCTIONS:  Return in about 4 months (around 01/29/2021).    ?? Maddax received counseling on the following healthy behaviors:tobacco cessation    ?? Discussed use, benefit, and side effects of prescribed medications.  Barriers to  medication compliance addressed.  All patient questions answered.  Pt  verbalized understanding of all instructions given.    ?? Patient given educational materials - see patient instructions      ?? Patient advised to contact scheduling offices for any referrals or imaging orders  placed today if they have not been contacted in 48 hours.         Return in about 4 months (around 01/29/2021).    An electronic signature was used to authenticate this note.    --Curt Bears, APRN - CNP on 09/29/2020 at 5:01 PM  Visit Information    Have you changed or started any medications since your last visit including any over-the-counter medicines, vitamins, or herbal medicines? no    Are you having any side effects from any of your medications? -  no  Have you stopped taking any of your medications? Is so, why? -  no    Have you seen any other physician or provider since your last visit? Yes - Records Obtained  Have you had any other diagnostic tests since your last visit? Yes - Records Obtained  Have you been seen in the emergency room and/or had an admission to a hospital since we last saw you? Yes - Records Obtained  Have you had your routine dental cleaning in the past 6 months? no    Have you activated your MyChart account? If not, what are your barriers? Yes     Patient Care Team:  Curt Bears, APRN - CNP as PCP - General (Family Medicine)  Curt Bears, APRN - CNP as PCP - [facility] Empaneled Provider  [facility]  Corky Mull, MD as Consulting Physician (Gastroenterology)    Medical History Review  Past Medical, Family, and Social History reviewed and does not contribute to the patient presenting condition    Health Maintenance   Topic Date Due   ??? Varicella vaccine (1 of 2 - 2-dose childhood series) Never done   ??? COVID-19 Vaccine (1) Never done   ??? Flu vaccine (1) Never done   ??? Depression Screen  03/28/2021   ??? DTaP/Tdap/Td vaccine (2 - Td or Tdap) 07/09/2029   ??? Hepatitis C screen  Completed   ??? HIV screen  Completed   ??? Hepatitis A vaccine  Aged Out   ??? Hepatitis B vaccine  Aged Out   ??? Hib vaccine  Aged Out   ??? Meningococcal (ACWY) vaccine  Aged Out   ??? Pneumococcal 0-64 years Vaccine  Aged Out

## 2020-10-15 DIAGNOSIS — K56609 Unspecified intestinal obstruction, unspecified as to partial versus complete obstruction: Secondary | ICD-10-CM

## 2020-10-15 DIAGNOSIS — K56600 Partial intestinal obstruction, unspecified as to cause: Secondary | ICD-10-CM
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
## 2020-10-16 ENCOUNTER — Emergency Department: Admit: 2020-10-16 | Payer: PRIVATE HEALTH INSURANCE | Primary: Family

## 2020-10-16 ENCOUNTER — Inpatient Hospital Stay: Admit: 2020-10-16 | Payer: PRIVATE HEALTH INSURANCE | Primary: Family

## 2020-10-16 ENCOUNTER — Inpatient Hospital Stay
Admit: 2020-10-16 | Discharge: 2020-10-18 | Disposition: A | Discharge status: Home / Self Care | Payer: PRIVATE HEALTH INSURANCE | Admitting: Internal Medicine

## 2020-10-16 LAB — BASIC METABOLIC PANEL
Anion Gap: 12 mmol/L (ref 9–17)
BUN: 17 mg/dL (ref 6–20)
Bun/Cre Ratio: 19 (ref 9–20)
CO2: 27 mmol/L (ref 20–31)
Calcium: 9.7 mg/dL (ref 8.6–10.4)
Chloride: 102 mmol/L (ref 98–107)
Creatinine: 0.88 mg/dL (ref 0.70–1.20)
GFR African American: >60 mL/min (ref >60)
GFR Non-African American: >60 mL/min (ref >60)
Glucose: 119 mg/dL — ABNORMAL HIGH (ref 70–99)
Potassium: 4.1 mmol/L (ref 3.7–5.3)
Sodium: 141 mmol/L (ref 135–144)

## 2020-10-16 LAB — CBC WITH AUTO DIFFERENTIAL
Absolute Eos #: 0.12 10*3/uL (ref 0.00–0.44)
Absolute Immature Granulocyte: 0.12 10*3/uL (ref 0.00–0.30)
Absolute Lymph #: 1.87 10*3/uL (ref 1.10–3.70)
Absolute Mono #: 1.09 10*3/uL (ref 0.10–1.20)
Basophils Absolute: 0.04 10*3/uL (ref 0.00–0.20)
Basophils: 0 % (ref 0–2)
Eosinophils %: 1 % (ref 1–4)
Hematocrit: 48.9 % (ref 40.7–50.3)
Hemoglobin: 16.5 g/dL (ref 13.0–17.0)
Immature Granulocytes: 1 % — ABNORMAL HIGH
Lymphocytes: 13 % — ABNORMAL LOW (ref 24–43)
MCH: 29.8 pg (ref 25.2–33.5)
MCHC: 33.7 g/dL (ref 28.4–34.8)
MCV: 88.3 fL (ref 82.6–102.9)
MPV: 9.8 fL (ref 8.1–13.5)
Monocytes: 7 % (ref 3–12)
NRBC Automated: 0 per 100 WBC
Platelets: 314 10*3/uL (ref 138–453)
RBC: 5.54 m/uL (ref 4.21–5.77)
RDW: 12.3 % (ref 11.8–14.4)
Seg Neutrophils: 78 % — ABNORMAL HIGH (ref 36–65)
Segs Absolute: 11.78 10*3/uL — ABNORMAL HIGH (ref 1.50–8.10)
WBC: 15 10*3/uL — ABNORMAL HIGH (ref 3.5–11.3)

## 2020-10-16 LAB — MAGNESIUM: Magnesium: 2.1 mg/dL (ref 1.6–2.6)

## 2020-10-16 MED ORDER — NORMAL SALINE FLUSH 0.9 % IV SOLN
0.9 % | INTRAVENOUS | Status: DC | PRN
Start: 2020-10-16 — End: 2020-10-17
  Administered 2020-10-16: 05:00:00 via INTRAVENOUS

## 2020-10-16 MED ORDER — POTASSIUM CHLORIDE 10 MEQ/100ML IV SOLN
10 MEQ/0ML | INTRAVENOUS | Status: DC | PRN
Start: 2020-10-16 — End: 2020-10-17

## 2020-10-16 MED ORDER — PROMETHAZINE HCL 25 MG/ML IJ SOLN
25 MG/ML | Freq: Four times a day (QID) | INTRAMUSCULAR | Status: DC | PRN
Start: 2020-10-16 — End: 2020-10-17
  Administered 2020-10-17: 02:00:00 via INTRAMUSCULAR

## 2020-10-16 MED ORDER — FENTANYL CITRATE (PF) 100 MCG/2ML IJ SOLN
100 MCG/2ML | INTRAMUSCULAR | Status: DC | PRN
Start: 2020-10-16 — End: 2020-10-17
  Administered 2020-10-16 – 2020-10-17 (×9): 25 ug via INTRAVENOUS

## 2020-10-16 MED ORDER — ACETAMINOPHEN 325 MG PO TABS
325 MG | Freq: Four times a day (QID) | ORAL | Status: DC | PRN
Start: 2020-10-16 — End: 2020-10-17
  Administered 2020-10-17: 19:00:00 650 mg via ORAL

## 2020-10-16 MED ORDER — SODIUM CHLORIDE 0.9 % IV BOLUS
0.9 % | Freq: Once | INTRAVENOUS | Status: AC
Start: 2020-10-16 — End: 2020-10-16
  Administered 2020-10-16: 05:00:00 via INTRAVENOUS

## 2020-10-16 MED ORDER — ENOXAPARIN SODIUM 40 MG/0.4ML SC SOLN
40 MG/0.4ML | Freq: Every day | SUBCUTANEOUS | Status: DC
Start: 2020-10-16 — End: 2020-10-17

## 2020-10-16 MED ORDER — POTASSIUM BICARB-CITRIC ACID 20 MEQ PO TBEF
20 MEQ | ORAL | Status: DC | PRN
Start: 2020-10-16 — End: 2020-10-17

## 2020-10-16 MED ORDER — POLYETHYLENE GLYCOL 3350 17 G PO PACK
17 g | Freq: Every day | ORAL | Status: DC | PRN
Start: 2020-10-16 — End: 2020-10-17

## 2020-10-16 MED ORDER — MAGNESIUM SULFATE IN D5W 1-5 GM/100ML-% IV SOLN
1-5 | INTRAVENOUS | Status: DC | PRN
Start: 2020-10-16 — End: 2020-10-17

## 2020-10-16 MED ORDER — NORMAL SALINE FLUSH 0.9 % IV SOLN
0.9 % | INTRAVENOUS | Status: DC | PRN
Start: 2020-10-16 — End: 2020-10-17

## 2020-10-16 MED ORDER — NORMAL SALINE FLUSH 0.9 % IV SOLN
0.9 | Freq: Two times a day (BID) | INTRAVENOUS | Status: DC
Start: 2020-10-16 — End: 2020-10-17
  Administered 2020-10-16 – 2020-10-17 (×3): via INTRAVENOUS

## 2020-10-16 MED ORDER — FENTANYL CITRATE (PF) 100 MCG/2ML IJ SOLN
100 MCG/2ML | Freq: Once | INTRAMUSCULAR | Status: AC
Start: 2020-10-16 — End: 2020-10-16
  Administered 2020-10-16: 09:00:00 via INTRAVENOUS

## 2020-10-16 MED ORDER — ACETAMINOPHEN 650 MG RE SUPP
650 MG | Freq: Four times a day (QID) | RECTAL | Status: DC | PRN
Start: 2020-10-16 — End: 2020-10-17

## 2020-10-16 MED ORDER — SODIUM CHLORIDE 0.9 % IV SOLN
0.9 % | INTRAVENOUS | Status: DC | PRN
Start: 2020-10-16 — End: 2020-10-17

## 2020-10-16 MED ORDER — KCL IN DEXTROSE-NACL 20-5-0.45 MEQ/L-%-% IV SOLN
INTRAVENOUS | Status: DC
Start: 2020-10-16 — End: 2020-10-17
  Administered 2020-10-16 – 2020-10-17 (×4): via INTRAVENOUS

## 2020-10-16 MED ORDER — ONDANSETRON HCL 4 MG/2ML IJ SOLN
4 MG/2ML | Freq: Once | INTRAMUSCULAR | Status: DC
Start: 2020-10-16 — End: 2020-10-17

## 2020-10-16 MED ORDER — SODIUM CHLORIDE 0.9 % IV SOLN
0.9 % | INTRAVENOUS | Status: DC
Start: 2020-10-16 — End: 2020-10-16
  Administered 2020-10-16: 06:00:00 via INTRAVENOUS

## 2020-10-16 MED ORDER — POTASSIUM CHLORIDE CRYS ER 20 MEQ PO TBCR
20 MEQ | ORAL | Status: DC | PRN
Start: 2020-10-16 — End: 2020-10-17

## 2020-10-16 MED ORDER — MORPHINE SULFATE (PF) 4 MG/ML IJ SOLN
4 MG/ML | Freq: Once | INTRAMUSCULAR | Status: AC
Start: 2020-10-16 — End: 2020-10-16
  Administered 2020-10-16: 06:00:00 via INTRAVENOUS

## 2020-10-16 MED ORDER — IOPAMIDOL 76 % IV SOLN
76 % | Freq: Once | INTRAVENOUS | Status: AC | PRN
Start: 2020-10-16 — End: 2020-10-16
  Administered 2020-10-16: 05:00:00 via INTRAVENOUS

## 2020-10-16 MED FILL — ONDANSETRON HCL 4 MG/2ML IJ SOLN: 4 MG/2ML | INTRAMUSCULAR | Qty: 2

## 2020-10-16 MED FILL — FENTANYL CITRATE (PF) 100 MCG/2ML IJ SOLN: 100 MCG/2ML | INTRAMUSCULAR | Qty: 2

## 2020-10-16 MED FILL — KCL IN DEXTROSE-NACL 20-5-0.45 MEQ/L-%-% IV SOLN: 20-5-0.45 MEQ/L-%-% | INTRAVENOUS | Qty: 1000

## 2020-10-16 MED FILL — PROMETHAZINE HCL 25 MG/ML IJ SOLN: 25 mg/mL | INTRAMUSCULAR | Qty: 1

## 2020-10-16 MED FILL — MORPHINE SULFATE 4 MG/ML IJ SOLN: 4 mg/mL | INTRAMUSCULAR | Qty: 1

## 2020-10-16 NOTE — Progress Notes (Signed)
Comprehensive Nutrition Assessment    Type and Reason for Visit:  Initial    Nutrition Recommendations/Plan:   1. Continue NPO diet  2. Monitor GI function, labs and diet advancement  3. If diet can not be advanced in the next 4 days consider need for nutrition support    Nutrition Assessment:  Patient admission is related to small bowel obstruction. Patient reports falling off a ladder last Friday and then started to have severe pain in lower back and across rib cage. Patient reports that was the worst pain he ever felt. Patient is now NPO with NG tube set to LIWS. Patient reports pain medications are helping and he is having flatus but diarrhea has resolved. Will monitor GI function, diet advancement and labs.    Malnutrition Assessment:  Malnutrition Status:  No malnutrition      Estimated Daily Nutrient Needs:  Energy (kcal):  1750-1900 kcal (23-25 kcal/kg); Weight Used for Energy Requirements:  Current     Protein (g):  84-91 gm of protein (1.2-1.3 gm/kg); Weight Used for Protein Requirements:  Ideal          Nutrition Related Findings:  No edema. Active bowel sounds, bloating and nausea. NG tube suction LIWS.      Wounds:  None       Current Nutrition Therapies:    Diet NPO    Anthropometric Measures:  ?? Height: 5\' 8"  (172.7 cm)  ?? Current Body Weight: 168 lb (76.2 kg)    ?? Ideal Body Weight: 154 lbs; % Ideal Body Weight 109.1 %   ?? BMI: 25.6   ?? BMI Categories: Overweight (BMI 25.0-29.9)       Nutrition Diagnosis:   ?? Altered GI function related to altered GI function as evidenced by GI abnormality,nausea      Nutrition Interventions:   Food and/or Nutrient Delivery:  Continue NPO  Nutrition Education/Counseling:  Education not indicated   Coordination of Nutrition Care:  Continue to monitor while inpatient    Goals:  Initiate oral diet within the next 4 days       Nutrition Monitoring and Evaluation:   Food/Nutrient Intake Outcomes:  Diet Advancement/Tolerance  Physical Signs/Symptoms Outcomes:   Biochemical Data,Fluid Status or Edema,Skin,Weight,GI Status     Discharge Planning:    Too soon to determine         05-21-2001  MFN, RDN, LDN  Lead Clinical Dietitian  RD Office Phone [phone]

## 2020-10-16 NOTE — ACP (Advance Care Planning) (Signed)
Advance Care Planning     Advance Care Planning Activator (Inpatient)  Conversation Note      Date of ACP Conversation: 10/16/2020     Conversation Conducted with: Patient with Decision Making Capacity    ACP Activator: Genevieve Norlander, RN    {When Decision Maker makes decisions on behalf of the incapacitated patient: Decision Maker is asked to consider and make decisions based on patient values, known preferences, or best interests.     Health Care Decision Maker:     Current Designated Health Care Decision Maker:   Erskine Emery: girlfriend  Phone: [phone]    Click here to complete Healthcare Decision Makers including section of the Healthcare Decision Maker Relationship (ie "Primary")      Care Preferences    Ventilation:  "If you were in your present state of health and suddenly became very ill and were unable to breathe on your own, what would your preference be about the use of a ventilator (breathing machine) if it were available to you?"      Would the patient desire the use of ventilator (breathing machine)?: yes    "If your health worsens and it becomes clear that your chance of recovery is unlikely, what would your preference be about the use of a ventilator (breathing machine) if it were available to you?"     Would the patient desire the use of ventilator (breathing machine)?: No      Resuscitation  "CPR works best to restart the heart when there is a sudden event, like a heart attack, in someone who is otherwise healthy. Unfortunately, CPR does not typically restart the heart for people who have serious health conditions or who are very sick."    "In the event your heart stopped as a result of an underlying serious health condition, would you want attempts to be made to restart your heart (answer "yes" for attempt to resuscitate) or would you prefer a natural death (answer "no" for do not attempt to resuscitate)?" yes       []  Yes   [x]  No   Educated Patient / Decision Maker regarding differences  between Advance Directives and portable DNR orders.    Length of ACP Conversation in minutes:  5    Conversation Outcomes:  [x]  ACP discussion completed  []  Existing advance directive reviewed with patient; no changes to patient's previously recorded wishes  []  New Advance Directive completed  []  Portable Do Not Rescitate prepared for Provider review and signature  []  POLST/POST/MOLST/MOST prepared for Provider review and signature      Follow-up plan:    []  Schedule follow-up conversation to continue planning  []  Referred individual to Provider for additional questions/concerns   []  Advised patient/agent/surrogate to review completed ACP document and update if needed with changes in condition, patient preferences or care setting    []  This note routed to one or more involved healthcare providers

## 2020-10-16 NOTE — Plan of Care (Signed)
Problem: Nausea/Vomiting:  Goal: Absence of nausea/vomiting  Outcome: Ongoing     Problem: Bowel/Gastric:  Goal: Bowel function will improve  Outcome: Ongoing     Problem: Bowel/Gastric:  Goal: Ability to achieve a regular elimination pattern will improve  Outcome: Ongoing     Problem: Physical Regulation:  Goal: Complications related to the disease process, condition or treatment will be avoided or minimized  Outcome: Ongoing     Problem: Sensory:  Goal: Pain level will decrease  Outcome: Ongoing

## 2020-10-16 NOTE — Care Coordination-Inpatient (Signed)
Case Management Initial Discharge Plan  David [name],             Met with:patient to discuss discharge plans.   Information verified: address, contacts, phone number, DOB, insurance Yes  PCP: Danice Goltz, APRN - CNP  Date of last visit: 09-29-2020    Insurance Provider: [employer]    Discharge Planning    Living Arrangements:  Spouse/Significant Other,Children   Support Systems:  Spouse/Significant [place] Members    Home has lower level duplex  2 stairs to climb to get into front door, 0 stairs to climb to reach second floor  Location of bedroom/bathroom in home  - main floor    Patient able to perform ADL's:Independent    Current Services (outpatient & in home) none  DME equipment: none  DME provider: N/A    Pharmacy: [facility]    Potential Assistance Needed:  N/A    Patient agreeable to home care: No  Freedom of choice provided:  n/a    Prior SNF/Rehab Placement and Facility: No  Agreeable to SNF/Rehab: No  Freedom of choice provided: n/a  Social Services Evaluation: n/a    Expected Discharge date:  10/18/20  Patient expects to be discharged to:   home  Follow Up Appointment: Best Day/ Time:      Transportation provider: girlfriend/mom  Transportation arrangements needed for discharge: No    Readmission Risk              Risk of Unplanned Readmission:  9             Does patient have a readmission risk score greater than 14?: No  If yes, follow-up appointment must be made within 7 days of discharge.     Goal of Care:       Discharge Plan: Met with patient at bedside.  Lives with girlfriend and 2 children, independent and works full time.  Admitted for SBO and has NG in place.  S/P fall off ladder on Friday and started to have severe pain lower back and across rib cage with vomiting.   CT abd showed moderate distal SBO.  Continue to follow plan of care.  Declines home needs at this time.               Electronically signed by Adah Salvage, RN on 10/16/20 at 8:41 AM EDT

## 2020-10-16 NOTE — Progress Notes (Signed)
Pt removed NG accidentally. Pt agreeable to new NG placement. New NG placed without complications and secured. NG connected to LIWS.

## 2020-10-16 NOTE — Progress Notes (Signed)
Patient admitted to room 2025 from ER.  Oriented to room and call light/tv controls.  Bed in lowest position, wheels locked, 2/4 side rails up  Call light in reach, room free of clutter, adequate lighting provided.

## 2020-10-16 NOTE — Progress Notes (Signed)
Transitions of Care Pharmacy Service   Medication Review    The patient's list of current home medications has been reviewed.     Source(s) of information: surescripts, patient     Based on information provided by the above source(s), I have updated the patient's home med list as described below.     Please review the ACTION REQUESTED section of this note for any discrepancies on current hospital orders.      I changed or updated the following medications on the patient's home medication list:  Added Multivitamin adult tab PO QD         Please feel free to call me with any questions about this encounter. Thank you.    This note will be reviewed and co-signed by the Transitions of Care Pharmacist.    Simonne Martinet, PharmD student  Transitions of Care Pharmacy Service  Phone:  [phone]  Fax: [phone]      Electronically signed by Simonne Martinet on 10/16/2020 at 12:17 PM     Prior to Admission medications    Medication Sig Start Date End Date Taking? Authorizing Provider   Multiple Vitamin (MULTIVITAMIN ADULT) TABS Take 1 tablet by mouth daily   Yes Historical Provider, MD   gabapentin (NEURONTIN) 600 MG tablet TAKE 1 TAB BY MOUTH THREE TIMES DAILY 09/29/20 12/21/20  Curt Bears, APRN - CNP

## 2020-10-16 NOTE — Plan of Care (Signed)
Problem: Pain:  Goal: Pain level will decrease  Description: Pain level will decrease  10/16/2020 0541 by Carolan Clines, RN  Outcome: Ongoing     Problem: Pain:  Goal: Control of acute pain  Description: Control of acute pain  10/16/2020 1019 by Unice Cobble, RN  Outcome: Ongoing  10/16/2020 0903 by Unice Cobble, RN  Outcome: Ongoing     Problem: Pain:  Goal: Pain level will decrease  Description: Pain level will decrease  10/16/2020 0541 by Carolan Clines, RN  Outcome: Ongoing
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
## 2020-10-16 NOTE — Progress Notes (Signed)
Pt removed NG. Oncoming nurse Beth aware.

## 2020-10-16 NOTE — Plan of Care (Signed)
Nutrition Problem #1: Altered GI function  Intervention: Food and/or Nutrient Delivery: Continue NPO  Nutritional Goals: Initiate oral diet within the next 4 days

## 2020-10-16 NOTE — Other (Addendum)
Pt c/o nausea;continues to refuse to have NGT re-inserted. Medicated as ordered

## 2020-10-16 NOTE — ED Notes (Signed)
Report called to Medsurg RN     Randell Loop, RN  10/16/20 [phone]

## 2020-10-16 NOTE — Plan of Care (Signed)
Problem: Pain:  Goal: Pain level will decrease  Description: Pain level will decrease  10/16/2020 0541 by Carolan Clines, RN  Outcome: Ongoing     Problem: Pain:  Goal: Control of acute pain  Description: Control of acute pain  Outcome: Ongoing
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
## 2020-10-17 ENCOUNTER — Inpatient Hospital Stay: Admit: 2020-10-17 | Payer: PRIVATE HEALTH INSURANCE | Primary: Family

## 2020-10-17 LAB — CBC WITH AUTO DIFFERENTIAL
Absolute Eos #: 0.09 10*3/uL (ref 0.00–0.44)
Absolute Immature Granulocyte: 0.02 10*3/uL (ref 0.00–0.30)
Absolute Lymph #: 1.92 10*3/uL (ref 1.10–3.70)
Absolute Mono #: 0.74 10*3/uL (ref 0.10–1.20)
Basophils Absolute: 0.03 10*3/uL (ref 0.00–0.20)
Basophils: 0 % (ref 0–2)
Eosinophils %: 1 % (ref 1–4)
Hematocrit: 42.1 % (ref 40.7–50.3)
Hemoglobin: 13.6 g/dL (ref 13.0–17.0)
Immature Granulocytes: 0 %
Lymphocytes: 23 % — ABNORMAL LOW (ref 24–43)
MCH: 29.4 pg (ref 25.2–33.5)
MCHC: 32.3 g/dL (ref 28.4–34.8)
MCV: 90.9 fL (ref 82.6–102.9)
MPV: 10.3 fL (ref 8.1–13.5)
Monocytes: 9 % (ref 3–12)
NRBC Automated: 0 per 100 WBC
Platelets: 235 10*3/uL (ref 138–453)
RBC: 4.63 m/uL (ref 4.21–5.77)
RDW: 12.3 % (ref 11.8–14.4)
Seg Neutrophils: 67 % — ABNORMAL HIGH (ref 36–65)
Segs Absolute: 5.58 10*3/uL (ref 1.50–8.10)
WBC: 8.4 10*3/uL (ref 3.5–11.3)

## 2020-10-17 LAB — LACTATE, SEPSIS: Lactic Acid, Sepsis: 0.7 mmol/L (ref 0.5–1.9)

## 2020-10-17 LAB — COMPREHENSIVE METABOLIC PANEL W/ REFLEX TO MG FOR LOW K
ALT: 15 U/L (ref 5–41)
AST: 21 U/L (ref <40)
Albumin: 4 g/dL (ref 3.5–5.2)
Alkaline Phosphatase: 110 U/L (ref 40–129)
Anion Gap: 11 mmol/L (ref 9–17)
BUN: 13 mg/dL (ref 6–20)
Bun/Cre Ratio: 20 (ref 9–20)
CO2: 24 mmol/L (ref 20–31)
Calcium: 8.8 mg/dL (ref 8.6–10.4)
Chloride: 108 mmol/L — ABNORMAL HIGH (ref 98–107)
Creatinine: 0.65 mg/dL — ABNORMAL LOW (ref 0.70–1.20)
GFR African American: >60 mL/min (ref >60)
GFR Non-African American: >60 mL/min (ref >60)
Glucose: 96 mg/dL (ref 70–99)
Potassium: 4 mmol/L (ref 3.7–5.3)
Sodium: 143 mmol/L (ref 135–144)
Total Bilirubin: 0.46 mg/dL (ref 0.3–1.2)
Total Protein: 6.5 g/dL (ref 6.4–8.3)

## 2020-10-17 LAB — AMYLASE: Amylase: 43 U/L (ref 28–100)

## 2020-10-17 LAB — PHOSPHORUS: Phosphorus: 3 mg/dL (ref 2.5–4.5)

## 2020-10-17 LAB — MAGNESIUM: Magnesium: 1.8 mg/dL (ref 1.6–2.6)

## 2020-10-17 LAB — LIPASE: Lipase: 29 U/L (ref 13–60)

## 2020-10-17 MED ORDER — MAGNESIUM SULFATE 2000 MG/50 ML IVPB PREMIX
2 GM/50ML | Freq: Once | INTRAVENOUS | Status: AC
Start: 2020-10-17 — End: 2020-10-17
  Administered 2020-10-17: 12:00:00 via INTRAVENOUS

## 2020-10-17 MED ORDER — MAGNESIUM SULFATE 50 % IJ SOLN
50 % | Freq: Once | INTRAMUSCULAR | Status: DC
Start: 2020-10-17 — End: 2020-10-17

## 2020-10-17 MED ORDER — GABAPENTIN 300 MG PO CAPS
300 MG | Freq: Three times a day (TID) | ORAL | Status: DC
Start: 2020-10-17 — End: 2020-10-17
  Administered 2020-10-17 (×2): via ORAL

## 2020-10-17 MED ORDER — MAGNESIUM SULFATE IN D5W 1-5 GM/100ML-% IV SOLN
1-5 GM/100ML-% | Freq: Once | INTRAVENOUS | Status: AC
Start: 2020-10-17 — End: 2020-10-17
  Administered 2020-10-17: 14:00:00 via INTRAVENOUS

## 2020-10-17 MED ORDER — TRAMADOL HCL 50 MG PO TABS
50 MG | ORAL | Status: DC | PRN
Start: 2020-10-17 — End: 2020-10-17

## 2020-10-17 MED FILL — GABAPENTIN 300 MG PO CAPS: 300 mg | ORAL | Qty: 2

## 2020-10-17 MED FILL — FENTANYL CITRATE (PF) 100 MCG/2ML IJ SOLN: 100 MCG/2ML | INTRAMUSCULAR | Qty: 2

## 2020-10-17 MED FILL — MAGNESIUM SULFATE 2 GM/50ML IV SOLN: 2 GM/50ML | INTRAVENOUS | Qty: 50

## 2020-10-17 MED FILL — LOVENOX 40 MG/0.4ML SC SOLN: 40 MG/0.4ML | SUBCUTANEOUS | Qty: 0.4

## 2020-10-17 MED FILL — KCL IN DEXTROSE-NACL 20-5-0.45 MEQ/L-%-% IV SOLN: 20-5-0.45 MEQ/L-%-% | INTRAVENOUS | Qty: 1000

## 2020-10-17 MED FILL — MAGNESIUM SULFATE IN D5W 1-5 GM/100ML-% IV SOLN: 1-5 GM/100ML-% | INTRAVENOUS | Qty: 100

## 2020-10-17 MED FILL — ACETAMINOPHEN 325 MG PO TABS: 325 mg | ORAL | Qty: 2

## 2020-10-17 NOTE — Discharge Summary (Signed)
[facility]  Office: [phone]  Ardelle Anton, DO, Susy Frizzle, DO, Freddi Che, DO, Tinnie Gens Blood, DO, Salome Arnt, MD, Lowry Bowl, MD, Arnold Long, MD, Chad Cordial, MD, Minus Breeding, MD, Seabron Spates, MD, Yolanda Bonine, MD, Silvio Clayman, DO, Juleen Starr, DO, Ree Shay, MD,  Orion Modest, DO, Dinah Beers, MD, Jeanmarie Plant, MD, Tiney Rouge, MD, Burman Nieves, DO, Wilnette Kales, MD, Durwin Glaze, MD, Carlena Hurl, CNP, [facility] [place], CNP, Marylin Crosby, CNP, Reola Mosher, CNS, Erick Alley, CNP, Ival Bible, CNP, Dereck Ligas, CNP, Luberta Mutter, CNP, Henry Russel, CNP, Luna Glasgow, PA-C, Bess Kinds, DNP, Minna Antis, DNP, Farrel Gordon, CNP, Glennie Hawk, CNP, Rikki Spearing, CNP         [facility]   [facility]    Discharge Summary     Patient ID: David [name]  DOB:  01/22/1986   MRN: [id number]     ACCOUNT:  [id number]   Patient's PCP: Curt Bears, APRN - CNP  Admit Date: 10/15/2020   Discharge Date: 10/17/2020   Length of Stay: 1  Code Status:  Full Code  Admitting Physician: No admitting provider for patient encounter.  Discharge Physician: Marlon Pel, MD     Active Discharge Diagnoses:     Hospital Problem Lists:  Principal Problem:    SBO (small bowel obstruction) (HCC) versus ileus  Active Problems:    Anxiety    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    Chest wall pain    History of fall  Resolved Problems:    * No resolved hospital problems. *      Admission Condition:  poor     Discharged Condition: stable    Hospital Stay:     Hospital Course:    Darlene Bartelt Yo??is a 35 y.o.??male??who presents to the emergency department for chest and back pain. ??He states he fell off of ladder 8 or 9 feet up 5 days ago and landed on his chest. ??He did not hit his head and has no extremity pain. ??He thought he may have broken a rib but figured there was not anything that they could do  about it so he did not go to the hospital. ??Subsequently he started vomiting violently and the pain got worse so he came in here. ??That happened a few days ago. ??No LOC from the fall.  ??  CT chest abdomen was suggestive ofModerate distal small bowel obstruction??due to which NG tube was placed, he has been kept n.p.o. and started on IV fluids.  On further questioning patient states he had diarrhea with multiple stools yesterday but he did not pass flatus. ??Today he had no bowel movement or flatus so far but feels he will be going soon. ??His nausea/vomiting has improved after NG tube placement    10/17/2020  Had bowel movement yesterday night and today morning  Passing flatus  Surgery started him on clear liquid diet which he tolerated and was advanced to full liquid diet  KUB done today morning shows improvement from prior imaging    Plan:      ??  1. Full liquid diet, advance slowly to regular diet  2. Continue home dose of Neurontin and multivitamin  3. Discharge home         Significant therapeutic interventions: Conservative management    Significant Diagnostic Studies:   Labs / Micro:  CBC:   Lab Results   Component Value Date  WBC 8.4 10/17/2020    RBC 4.63 10/17/2020    RBC 5.57 03/31/2010    HGB 13.6 10/17/2020    HCT 42.1 10/17/2020    MCV 90.9 10/17/2020    MCH 29.4 10/17/2020    MCHC 32.3 10/17/2020    RDW 12.3 10/17/2020    PLT 235 10/17/2020    PLT 203 03/31/2010     BMP:    Lab Results   Component Value Date    GLUCOSE 96 10/17/2020    GLUCOSE 96 03/31/2010    NA 143 10/17/2020    K 4.0 10/17/2020    CL 108 10/17/2020    CO2 24 10/17/2020    ANIONGAP 11 10/17/2020    BUN 13 10/17/2020    CREATININE 0.65 10/17/2020    BUNCRER 20 10/17/2020    CALCIUM 8.8 10/17/2020    LABGLOM >60 10/17/2020    GFRAA >60 10/17/2020    GFR      10/17/2020     HFP:    Lab Results   Component Value Date    PROT 6.5 10/17/2020     CMP:    Lab Results   Component Value Date    GLUCOSE 96 10/17/2020    GLUCOSE 96 03/31/2010     NA 143 10/17/2020    K 4.0 10/17/2020    CL 108 10/17/2020    CO2 24 10/17/2020    BUN 13 10/17/2020    CREATININE 0.65 10/17/2020    ANIONGAP 11 10/17/2020    ALKPHOS 110 10/17/2020    ALT 15 10/17/2020    AST 21 10/17/2020    BILITOT 0.46 10/17/2020    LABALBU 4.0 10/17/2020    LABALBU 5.5 03/31/2010    ALBUMIN NOT REPORTED 02/27/2020    LABGLOM >60 10/17/2020    GFRAA >60 10/17/2020    GFR      10/17/2020    PROT 6.5 10/17/2020    CALCIUM 8.8 10/17/2020     PT/INR:  No results found for: PTINR, PROTIME, INR  PTT: No results found for: APTT  FLP:    Lab Results   Component Value Date    CHOL 143 02/21/2012    TRIG 49 02/21/2012    HDL 39 02/21/2012     U/A:    Lab Results   Component Value Date    COLORU YELLOW 02/27/2020    TURBIDITY CLEAR 02/27/2020    SPECGRAV 1.058 02/27/2020    HGBUR TRACE 02/27/2020    PHUR 6.0 02/27/2020    PROTEINU TRACE 02/27/2020    GLUCOSEU NEGATIVE 02/27/2020    KETUA 3+ 02/27/2020    BILIRUBINUR  02/27/2020     Presumptive positive. Unable to confirm due to unavailability of reagent.    UROBILINOGEN Normal 02/27/2020    NITRU NEGATIVE 02/27/2020    LEUKOCYTESUR NEGATIVE 02/27/2020     TSH:    Lab Results   Component Value Date    TSH 0.63 02/21/2012        Radiology:  XR ABDOMEN (KUB) (SINGLE AP VIEW)    Result Date: 10/17/2020  1.  Enteric tube not seen on today's radiograph, possibly outside the field of view versus removed. 2.  Improved appearance of small bowel loops with some mildly dilated loops remaining, suggesting partial obstruction or resolving obstruction.     XR ABDOMEN (KUB) (SINGLE AP VIEW)    Result Date: 10/16/2020  Partial small bowel obstruction.  Small bowel diameter 4.3 cm increased over prior study.  Transition zone suggested  in the right lower quadrant.  No organomegaly or free air.     XR ABDOMEN FOR NG/OG/NE TUBE PLACEMENT    Result Date: 10/16/2020  Enteric tube terminating in the fundus of the stomach.     CT CHEST ABDOMEN PELVIS W CONTRAST    Result Date:  10/16/2020  Moderate distal small bowel obstruction.  Transition point is in the lower abdomen/pelvis. No acute finding in the chest. RECOMMENDATIONS: Unavailable       Consultations:    Consults:     Final Specialist Recommendations/Findings:   IP CONSULT TO HOSPITALIST  IP CONSULT TO GENERAL SURGERY      The patient was seen and examined on day of discharge and this discharge summary is in conjunction with any daily progress note from day of discharge.    Discharge plan:     Disposition: Home    Physician Follow Up:     Curt Bears, APRN - CNP  [address]  [place] [address]  [phone]    Schedule an appointment as soon as possible for a visit in 1 week      Lahoma Crocker, MD  [phone] [address]  [place]  [place] [address]  [phone]      As needed, general surgery           Diet: As tolerated    Activity: As tolerated        Discharge Medications:      Medication List      CONTINUE taking these medications    gabapentin 600 MG tablet  Commonly known as: NEURONTIN  TAKE 1 TAB BY MOUTH THREE TIMES DAILY     Multivitamin Adult Tabs            No discharge procedures on file.    Time Spent on discharge is  31 mins in patient examination, evaluation, counseling as well as medication reconciliation, prescriptions for required medications, discharge plan and follow up.    Electronically signed by   Marlon Pel, MD  10/17/2020  3:31 PM      Thank you Dr. Curt Bears, APRN - CNP for the opportunity to be involved in this patient's care.

## 2020-10-17 NOTE — Other (Signed)
Patient is awake and alert as he sits on the edge of the bed. Patient is approachable but passive. Patient states that he is doing "OK". Patient states that he has family support and denies any spiritual or emotional concerns. Patient appears to be coping adequately and expresses appreciation for the visit. Spiritual Care is available for further needs.       10/17/20 1500   Encounter Summary   Services provided to: Patient   Referral/Consult From: Rounding   Support System Significant other;Parent   Continue Visiting   (10/17/20)   Complexity of Encounter Low   Length of Encounter 15 minutes   Routine   Type Initial   Assessment Approachable;Passive   Intervention Active listening;Explored coping resources;Nurtured hope   Outcome Expressed gratitude

## 2020-10-17 NOTE — Progress Notes (Signed)
[facility]  Office: [phone]  Ardelle Anton, DO, Susy Frizzle, DO, Freddi Che, DO, Tinnie Gens Blood, DO, Salome Arnt, MD, Lowry Bowl, MD, Arnold Long, MD, Chad Cordial, MD, Minus Breeding, MD, Seabron Spates, MD, Yolanda Bonine, MD, Silvio Clayman, DO, Juleen Starr, DO, Ree Shay, MD,  Orion Modest, DO, Dinah Beers, MD, Jeanmarie Plant, MD, Tiney Rouge, MD, Burman Nieves, DO, Wilnette Kales, MD, Durwin Glaze, MD, Carlena Hurl, CNP, [facility] [place], CNP, Marylin Crosby, CNP, Reola Mosher, CNS, Erick Alley, CNP, Ival Bible, CNP, Dereck Ligas, CNP, Luberta Mutter, CNP, Henry Russel, CNP, Luna Glasgow, PA-C, Bess Kinds, DNP, Minna Antis, DNP, Farrel Gordon, CNP, Glennie Hawk, CNP, Rikki Spearing, CNP         [facility]   [facility]    Progress Note    10/17/2020    12:43 PM    Name:   David [name]  MRN:     [id number]     Acct:      [id number]   Room:   [id number]  IP Day:  1  Admit Date:  10/15/2020 11:21 PM    PCP:   David Bears, APRN - CNP  Code Status:  Full Code    Subjective:     C/C:   Chief Complaint   Patient presents with   ??? Fall     Interval History Status:    Had bowel movement yesterday night and today morning  Passing flatus  Surgery started him on clear liquid diet  KUB done today morning shows improvement from prior imaging  Brief History:     [name]??is a 35 y.o.??male??who presents to the emergency department for chest and back pain. ??He states he fell off of ladder 8 or 9 feet up 5 days ago and landed on his chest. ??He did not hit his head and has no extremity pain. ??He thought he may have broken a rib but figured there was not anything that they could do about it so he did not go to the hospital. ??Subsequently he started vomiting violently and the pain got worse so he came in here. ??That happened a few days ago. ??No LOC from the fall.  ??  CT chest abdomen was suggestive ofModerate distal small bowel  obstruction due to which NG tube was placed, he has been kept n.p.o. and started on IV fluids.  On further questioning patient states he had diarrhea with multiple stools yesterday but he did not pass flatus.  Today he had no bowel movement or flatus so far but feels he will be going soon.  His nausea/vomiting has improved after NG tube placement    Review of Systems:     Constitutional:  negative for chills, fevers, sweats  Respiratory:  negative for cough, dyspnea on exertion, shortness of breath, wheezing  Cardiovascular:  negative for chest pain, chest pressure/discomfort, lower extremity edema, palpitations  Gastrointestinal:  negative for abdominal pain, constipation, diarrhea, nausea, vomiting  Neurological:  negative for dizziness, headache    Medications:     Allergies:    Allergies   Allergen Reactions   ??? Codeine Hives   ??? Nubain [Nalbuphine Hcl]    ??? Zofran [Ondansetron] Other (See Comments)     Made him feel off       Current Meds:   Scheduled Meds:   ??? gabapentin  600 mg Oral TID   ??? ondansetron  4 mg IntraVENous Once   ??? sodium  chloride flush  5-40 mL IntraVENous 2 times per day   ??? enoxaparin  40 mg SubCUTAneous Daily     Continuous Infusions:   ??? dextrose 5% and 0.45% NaCl with KCl 20 mEq 125 mL/hr at 10/17/20 1133   ??? sodium chloride       PRN Meds: sodium chloride flush, sodium chloride flush, sodium chloride, potassium chloride **OR** potassium alternative oral replacement **OR** potassium chloride, magnesium sulfate, acetaminophen **OR** acetaminophen, polyethylene glycol, promethazine, fentanNYL    Data:     Past Medical History:   has a past medical history of Abdominal pain, 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), Allergic rhinitis, Anxiety, Back pain, GI bleed, Headache, and Obesity.    Social History:   reports that he quit smoking about 8 years ago. He has a 5.00 pack-year smoking history. His smokeless tobacco use includes chew. He reports current alcohol use. He reports current drug use. Drugs:  Other-see comments, IV, and Opiates .     Family History:   Family History   Problem Relation Age of Onset   ??? Cancer Other 90        possible colon cancer       Vitals:  BP 129/85    Pulse 68    Temp 98.1 ??F (36.7 ??C) (Oral)    Resp 18    Ht 5\' 8"  (1.727 m)    Wt 168 lb (76.2 kg)    SpO2 100%    BMI 25.54 kg/m??   Temp (24hrs), Avg:98.1 ??F (36.7 ??C), Min:97.9 ??F (36.6 ??C), Max:98.3 ??F (36.8 ??C)    No results for input(s): POCGLU in the last 72 hours.    I/O (24Hr):    Intake/Output Summary (Last 24 hours) at 10/17/2020 1243  Last data filed at 10/17/2020 0040  Gross per 24 hour   Intake 933.2 ml   Output 310 ml   Net 623.2 ml       Labs:  Hematology:  Recent Labs     10/16/20  0017 10/17/20  0741   WBC 15.0* 8.4   RBC 5.54 4.63   HGB 16.5 13.6   HCT 48.9 42.1   MCV 88.3 90.9   MCH 29.8 29.4   MCHC 33.7 32.3   RDW 12.3 12.3   PLT 314 235   MPV 9.8 10.3     Chemistry:  Recent Labs     10/16/20  0017 10/17/20  0558   NA 141 143   K 4.1 4.0   CL 102 108*   CO2 27 24   GLUCOSE 119* 96   BUN 17 13   CREATININE 0.88 0.65*   MG 2.1 1.8   ANIONGAP 12 11   LABGLOM >60 >60   GFRAA >60 >60   CALCIUM 9.7 8.8   PHOS  --  3.0     Recent Labs     10/17/20  0558   PROT 6.5   LABALBU 4.0   AST 21   ALT 15   ALKPHOS 110   BILITOT 0.46   AMYLASE 43   LIPASE 29     ABG:  Lab Results   Component Value Date    FIO2 NOT REPORTED 09/09/2015     Lab Results   Component Value Date/Time    SPECIAL LH 09/11/2015 09/09/2015 11:06 AM     Lab Results   Component Value Date/Time    CULTURE NO GROWTH 6 DAYS 09/09/2015 11:06 AM    CULTURE  09/09/2015 11:06 AM  [facility] [address], [place] [address] [phone]       Radiology:  XR ABDOMEN (KUB) (SINGLE AP VIEW)    Result Date: 10/17/2020  1.  Enteric tube not seen on today's radiograph, possibly outside the field of view versus removed. 2.  Improved appearance of small bowel loops with some mildly dilated loops remaining, suggesting partial obstruction or resolving obstruction.     XR ABDOMEN  (KUB) (SINGLE AP VIEW)    Result Date: 10/16/2020  Partial small bowel obstruction.  Small bowel diameter 4.3 cm increased over prior study.  Transition zone suggested in the right lower quadrant.  No organomegaly or free air.     XR ABDOMEN FOR NG/OG/NE TUBE PLACEMENT    Result Date: 10/16/2020  Enteric tube terminating in the fundus of the stomach.     CT CHEST ABDOMEN PELVIS W CONTRAST    Result Date: 10/16/2020  Moderate distal small bowel obstruction.  Transition point is in the lower abdomen/pelvis. No acute finding in the chest. RECOMMENDATIONS: Unavailable       Physical Examination:        General appearance:  alert, cooperative and no distress  Mental Status:  oriented to person, place and time and normal affect  Lungs: + Multiple bruises on chest wall, tenderness is better today, chest clear to auscultation bilaterally, normal effort  Heart:  regular rate and rhythm, no murmur  Abdomen:  soft, nontender, nondistended, normal bowel sounds, no masses, hepatomegaly, splenomegaly  Extremities:  no edema, redness, tenderness in the calves  Skin:  no gross lesions, rashes, induration    Assessment:        Hospital Problems           Last Modified POA    * (Principal) SBO (small bowel obstruction) (HCC) versus ileus 10/16/2020 Yes    Anxiety 10/16/2020 Yes    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 10/16/2020 Yes    Chest wall pain 10/16/2020 Yes    History of fall 10/16/2020 Yes          Plan:        1. Clear liquid diet, will advance to a full liquid diet if tolerates  2. Discharge home dose of Neurontin  3. Likely to be discharged in the evening if tolerates diet    Haasini Patnaude Ignacia Bayley, MD  10/17/2020  12:43 PM

## 2020-10-17 NOTE — Plan of Care (Signed)
Problem: Pain:  Goal: Control of acute pain  Description: Control of acute pain  10/17/2020 0812 by Unice Cobble, RN  Outcome: Ongoing  10/17/2020 0033 by Rollen Sox, RN  Outcome: Ongoing     Problem: Pain:  Goal: Pain level will decrease  Description: Pain level will decrease  10/17/2020 0812 by Unice Cobble, RN  Outcome: Ongoing  10/17/2020 0033 by Rollen Sox, RN  Outcome: Ongoing   Pt reports that pain is improving.

## 2020-10-17 NOTE — Plan of Care (Signed)
Problem: Nausea/Vomiting:  Goal: Absence of nausea/vomiting  Description: Absence of nausea/vomiting  Outcome: Ongoing  Goal: Able to drink  Description: Able to drink  Outcome: Ongoing  Goal: Able to eat  Description: Able to eat  Outcome: Ongoing  Goal: Ability to achieve adequate nutritional intake will improve  Description: Ability to achieve adequate nutritional intake will improve  Outcome: Ongoing     Problem: Activity:  Goal: Risk for activity intolerance will decrease  Description: Risk for activity intolerance will decrease  Outcome: Ongoing     Problem: Bowel/Gastric:  Goal: Bowel function will improve  Description: Bowel function will improve  Outcome: Ongoing  Goal: Diagnostic test results will improve  Description: Diagnostic test results will improve  Outcome: Ongoing  Goal: Occurrences of nausea will decrease  Description: Occurrences of nausea will decrease  Outcome: Ongoing  Goal: Occurrences of vomiting will decrease  Description: Occurrences of vomiting will decrease  Outcome: Ongoing  Goal: Control of bowel function will improve  Description: Control of bowel function will improve  Outcome: Ongoing  Goal: Ability to achieve a regular elimination pattern will improve  Description: Ability to achieve a regular elimination pattern will improve  Outcome: Ongoing     Problem: Fluid Volume:  Goal: Maintenance of adequate hydration will improve  Description: Maintenance of adequate hydration will improve  Outcome: Ongoing     Problem: Sensory:  Goal: Ability to identify factors that increase the pain will improve  Description: Ability to identify factors that increase the pain will improve  Outcome: Ongoing  Goal: Ability to notify healthcare provider of pain before it becomes unmanageable or unbearable will improve  Description: Ability to notify healthcare provider of pain before it becomes unmanageable or unbearable will improve  Outcome: Ongoing  Goal: Pain level will decrease  Description: Pain  level will decrease  Outcome: Ongoing     Problem: Pain:  Goal: Pain level will decrease  Description: Pain level will decrease  Outcome: Ongoing  Goal: Control of acute pain  Description: Control of acute pain  Outcome: Ongoing  Goal: Control of chronic pain  Description: Control of chronic pain  Outcome: Ongoing     Problem: Nutrition  Goal: Optimal nutrition therapy  Outcome: Ongoing

## 2020-10-17 NOTE — Other (Signed)
Patient requesting to leave tonight with his mom;there is a DC order in and DC summary in noted per Dr. Randolm Idol.  IV DC'd and dressing applied  No bleeding noted   Pt dressed and has all belongings ready

## 2020-10-17 NOTE — Other (Signed)
Patient c/o bilateral knee pain which is not new for him.  Ice pack given per pt request

## 2020-10-17 NOTE — Progress Notes (Signed)
General Surgery:  Daily Progress Note               PATIENT NAME: David [name]     TODAY'S DATE: 10/17/2020   CC:  Back pain    SUBJECTIVE:     Pt seen and examined at bedside.  Afebrile, VSS. NGT removed by patient last evening. Some nausea last evening, better this AM. No emesis. Minimal abdominal discomfort. +flatus. States he did have a BM last evening but it was mainly just clear. KUB this AM with small bowel dilation improved from prior imaging.     General: No fevers or chills  Respiratory: No shortness of breath, wheezing  Cardiac: No chest pain, palpitations  Abdomen: Minimal abdominal discomfort, no nausea or emesis  MSK: Endorses to lower thoracic back discomfort lateral to midline    OBJECTIVE:   VITALS:  BP 137/77    Pulse 62    Temp 97.9 ??F (36.6 ??C) (Oral)    Resp 18    Ht 5\' 8"  (1.727 m)    Wt 168 lb (76.2 kg)    SpO2 100%    BMI 25.54 kg/m??      INTAKE/OUTPUT:      Intake/Output Summary (Last 24 hours) at 10/17/2020 0724  Last data filed at 10/17/2020 0040  Gross per 24 hour   Intake 1888.76 ml   Output 310 ml   Net 1578.76 ml       PHYSICAL EXAM:  General Appearance:  awake, alert, oriented, in no acute distress  HEENT:  Normocephalic, atraumatic, mucus membranes moist   Skin:  Skin color, texture, turgor normal. No rashes or lesions.  Lungs: Normal effort, no respirations, no accessory muscle usage  Heart: Regular rate and rhythm  Abdomen: Soft, nondistended, minimal left lower quadrant tenderness palpation without guarding or peritoneal signs  Extremities: Extremities warm to touch, pink, with no edema.      Data:  CBC:   Lab Results   Component Value Date    WBC 15.0 10/16/2020    RBC 5.54 10/16/2020    RBC 5.57 03/31/2010    HGB 16.5 10/16/2020    HCT 48.9 10/16/2020    MCV 88.3 10/16/2020    MCH 29.8 10/16/2020    MCHC 33.7 10/16/2020    RDW 12.3 10/16/2020    PLT 314 10/16/2020    PLT 203 03/31/2010    MPV 9.8 10/16/2020     BMP:    Lab Results   Component Value Date    NA 143 10/17/2020    K 4.0  10/17/2020    CL 108 10/17/2020    CO2 24 10/17/2020    BUN 13 10/17/2020    LABALBU 4.0 10/17/2020    LABALBU 5.5 03/31/2010    CREATININE 0.65 10/17/2020    CALCIUM 8.8 10/17/2020    GFRAA >60 10/17/2020    LABGLOM >60 10/17/2020    GLUCOSE 96 10/17/2020    GLUCOSE 96 03/31/2010       Radiology Review:    KUB pending    ASSESSMENT:  Active Hospital Problems    Diagnosis Date Noted   ??? SBO (small bowel obstruction) (HCC) versus ileus [K56.609] 10/16/2020   ??? Chest wall pain [R07.89] 10/16/2020   ??? History of fall [Z91.81] 10/16/2020   ??? 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 [K25.7] 02/21/2017   ??? Anxiety [F41.9] 12/28/2013       35 y.o. male with concern for pSBO  ??  Plan:  1.  Continue medical mgmt and supportive care per primary  2. Diet:  Clear liquid diet as tolerated.  Possible advancement to full liquid diet later today if tolerating.   3. Monitor for bowel function  4. F/u labs. Replace electrolytes PRN  5. Repeat abdominal exams-soft, no guarding, no peritoneal signs.  Plan to advance diet as patient develops more bowel function  ??  Myrlene Broker, DO  General Surgery PGY-3   Attending Physician Statement  I have discussed the case, including pertinent history and exam findings with the resident. I agree with the assessment, plan and orders as documented by the resident.    Benign abdominal exam. Xray better  Advance diet

## 2020-10-17 NOTE — Progress Notes (Signed)
After IV removed and discharge instructions given and signed, patient and family would like to stay to ensure diet advances well and symptoms do not return. Onalee Hua, lead RN spoke to Dr. Randolm Idol. Dr. Randolm Idol okay for patient to stay overnight.

## 2020-10-17 NOTE — Other (Signed)
Reviewed DC instructions with patient and his mother. Verbalized understanding. No new medications were given this admission. Patient ambulatory with his mom to main lobby

## 2020-12-25 ENCOUNTER — Encounter

## 2020-12-25 MED ORDER — GABAPENTIN 600 MG PO TABS
600 MG | ORAL_TABLET | ORAL | 2 refills | Status: DC
Start: 2020-12-25 — End: 2021-03-26

## 2020-12-25 NOTE — Telephone Encounter (Signed)
Health Maintenance   Topic Date Due   ??? Varicella vaccine (1 of 2 - 2-dose childhood series) Never done   ??? COVID-19 Vaccine (1) Never done   ??? Flu vaccine (Season Ended) 03/19/2021   ??? Depression Screen  03/28/2021   ??? DTaP/Tdap/Td vaccine (2 - Td or Tdap) 07/09/2029   ??? Hepatitis C screen  Completed   ??? HIV screen  Completed   ??? Hepatitis A vaccine  Aged Out   ??? Hepatitis B vaccine  Aged Out   ??? Hib vaccine  Aged Out   ??? Meningococcal (ACWY) vaccine  Aged Out   ??? Pneumococcal 0-64 years Vaccine  Aged Out             (applicable per patient's age: Cancer Screenings, Depression Screening, Fall Risk Screening, Immunizations)    LDL Cholesterol (mg/dL)   Date Value   60/04/9322 94     AST (U/L)   Date Value   10/17/2020 21     ALT (U/L)   Date Value   10/17/2020 15     BUN (mg/dL)   Date Value   55/73/2202 13      (goal A1C is < 7)   (goal LDL is <100) need 30-50% reduction from baseline     BP Readings from Last 3 Encounters:   10/17/20 126/75   09/29/20 119/74   03/28/20 (!) 162/89    (goal BP 120/80)      All Future Testing planned in [facility]:  Lab Frequency Next Occurrence       Next Visit Date:  Future Appointments   Date Time Provider Department Center   02/02/2021  3:45 PM Curt Bears, APRN - CNP [facility]            Patient Active Problem List:     Fatigue     Constipation     Chronic sinusitis     Migraine     Chronic bilateral low back pain without sciatica     Postnasal drip     Anxiety     Neck pain     Atopic rhinitis     Obesity with body mass index 30 or greater     Pneumonia of right lung due to infectious organism     Opioid dependence in remission (HCC)     Lower abdominal pain     GI bleed     Abdominal pain, LUQ     Functional diarrhea     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     SBO (small bowel obstruction) (HCC) versus ileus     Chest wall pain     History of fall

## 2020-12-30 ENCOUNTER — Encounter

## 2020-12-31 NOTE — Telephone Encounter (Signed)
-----   Message from Judene Companion sent at 12/29/2020 12:50 PM EDT -----  Subject: Refill Request    QUESTIONS  Name of Medication? gabapentin (NEURONTIN) 600 MG tablet  Patient-reported dosage and instructions? 3 times daily 600mg    How many days do you have left? 4  Preferred Pharmacy? [facility]  Pharmacy phone number (if available)? [phone]  ---------------------------------------------------------------------------  --------------  CALL BACK INFO  What is the best way for the office to contact you? OK to leave message on   voicemail  Preferred Call Back Phone Number? [phone]  ---------------------------------------------------------------------------  --------------  SCRIPT ANSWERS  Relationship to Patient? Self

## 2021-01-13 NOTE — Telephone Encounter (Signed)
error 

## 2021-02-02 ENCOUNTER — Encounter: Attending: Family | Primary: Family

## 2021-03-26 ENCOUNTER — Encounter

## 2021-03-26 MED ORDER — GABAPENTIN 600 MG PO TABS
600 MG | ORAL_TABLET | ORAL | 2 refills | Status: DC
Start: 2021-03-26 — End: 2021-06-22

## 2021-03-26 NOTE — Telephone Encounter (Signed)
Health Maintenance   Topic Date Due    COVID-19 Vaccine (1) Never done    Varicella vaccine (1 of 2 - 2-dose childhood series) Never done    Flu vaccine (1) Never done    Depression Screen  03/28/2021    DTaP/Tdap/Td vaccine (2 - Td or Tdap) 07/09/2029    Hepatitis C screen  Completed    HIV screen  Completed    Hepatitis A vaccine  Aged Out    Hepatitis B vaccine  Aged Out    Hib vaccine  Aged Out    Meningococcal (ACWY) vaccine  Aged Out    Pneumococcal 0-64 years Vaccine  Aged Out             (applicable per patient's age: Cancer Screenings, Depression Screening, Fall Risk Screening, Immunizations)    LDL Cholesterol (mg/dL)   Date Value   16/07/930 94     AST (U/L)   Date Value   10/17/2020 21     ALT (U/L)   Date Value   10/17/2020 15     BUN (mg/dL)   Date Value   35/57/3220 13      (goal A1C is < 7)   (goal LDL is <100) need 30-50% reduction from baseline     BP Readings from Last 3 Encounters:   10/17/20 126/75   09/29/20 119/74   03/28/20 (!) 162/89    (goal BP 120/80)      All Future Testing planned in [facility]:  Lab Frequency Next Occurrence       Next Visit Date:  No future appointments.         Patient Active Problem List:     Fatigue     Constipation     Chronic sinusitis     Migraine     Chronic bilateral low back pain without sciatica     Postnasal drip     Anxiety     Neck pain     Atopic rhinitis     Obesity with body mass index 30 or greater     Pneumonia of right lung due to infectious organism     Opioid dependence in remission [facility])     Lower abdominal pain     GI bleed     Abdominal pain, LUQ     Functional diarrhea     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     SBO (small bowel obstruction) (HCC) versus ileus     Chest wall pain     History of fall

## 2021-03-27 NOTE — Telephone Encounter (Signed)
Pt informed

## 2021-05-01 ENCOUNTER — Inpatient Hospital Stay
Admit: 2021-05-01 | Discharge: 2021-05-01 | Disposition: A | Discharge status: Home / Self Care | Payer: PRIVATE HEALTH INSURANCE | Attending: Emergency Medicine

## 2021-05-01 DIAGNOSIS — T40601A Poisoning by unspecified narcotics, accidental (unintentional), initial encounter: Principal | ICD-10-CM

## 2021-05-01 MED ORDER — NALOXONE OPIATE OVERDOSE KIT
Status: AC
Start: 2021-05-01 — End: 2021-05-01
  Administered 2021-05-01: 15:00:00 1 via NASAL

## 2021-05-01 MED ORDER — CETIRIZINE HCL 10 MG PO TABS
10 MG | Freq: Once | ORAL | Status: AC
Start: 2021-05-01 — End: 2021-05-01
  Administered 2021-05-01: 14:00:00 10 mg via ORAL

## 2021-05-01 MED ORDER — NALOXONE OPIATE OVERDOSE KIT
Freq: Once | Status: AC
Start: 2021-05-01 — End: 2021-05-01

## 2021-05-01 MED ORDER — PREDNISONE 20 MG PO TABS
20 MG | Freq: Once | ORAL | Status: AC
Start: 2021-05-01 — End: 2021-05-01
  Administered 2021-05-01: 14:00:00 50 mg via ORAL

## 2021-05-01 MED ORDER — NALOXONE HCL 0.4 MG/ML IJ SOLN
0.4 MG/ML | Freq: Once | INTRAMUSCULAR | Status: DC
Start: 2021-05-01 — End: 2021-05-01

## 2021-05-01 MED ORDER — NALOXONE HCL 2 MG/2ML IJ SOSY
2 MG/ML | Freq: Once | INTRAMUSCULAR | Status: AC
Start: 2021-05-01 — End: 2021-05-01
  Administered 2021-05-01: 14:00:00 2 mg via NASAL

## 2021-05-01 MED ORDER — FAMOTIDINE 20 MG PO TABS
20 MG | Freq: Once | ORAL | Status: AC
Start: 2021-05-01 — End: 2021-05-01
  Administered 2021-05-01: 14:00:00 20 mg via ORAL

## 2021-05-01 MED ORDER — NALOXONE OPIATE OVERDOSE KIT
Freq: Once | Status: AC
Start: 2021-05-01 — End: 2021-05-01
  Administered 2021-05-01: 14:00:00 1 via NASAL

## 2021-05-01 MED FILL — PREDNISONE 10 MG PO TABS: 10 mg | ORAL | Qty: 1

## 2021-05-01 MED FILL — FAMOTIDINE 20 MG PO TABS: 20 mg | ORAL | Qty: 1

## 2021-05-01 MED FILL — NALOXONE HCL 0.4 MG/ML IJ SOLN: 0.4 mg/mL | INTRAMUSCULAR | Qty: 5

## 2021-05-01 MED FILL — CETIRIZINE HCL 10 MG PO TABS: 10 mg | ORAL | Qty: 1

## 2021-05-01 MED FILL — NALOXONE OPIATE OVERDOSE KIT: Qty: 1

## 2021-05-01 NOTE — ED Notes (Signed)
Patient medicated for rash which was present prior to narcan administration. Patient refusing to take Narcan kits. Kits were given to mother who takes care of son and son's girlfriend. Patients girlfriend also visually impaired, unable to stand or keep eyes open. Patient refusing rehab as mother recommends. Patient admits to methadone and gabapentin use today and benzos two days ago. Patient refusing to stay. Patient eloped from room.      Vito Berger, RN  05/01/21 1040
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
## 2021-05-01 NOTE — ED Notes (Signed)
Pt is brought to this ER by family unresponsive, with shallow respirations, and pinpoint pupils in the backseat of their vehicle.  Pt responds to sternal rub, but he is unable to keep his eyes open.  Pt assisted to wheelchair and then transported to Rm 4.  Pt assisted to bed.  Pt immediately falls asleep on cart, but responds to painful stimuli.  Narcan 2mg  IN administered.     , RN  05/01/21 1038

## 2021-06-17 ENCOUNTER — Encounter

## 2021-06-17 NOTE — Telephone Encounter (Signed)
Health Maintenance   Topic Date Due    COVID-19 Vaccine (1) Never done    Varicella vaccine (1 of 2 - 2-dose childhood series) Never done    Flu vaccine (1) Never done    Depression Screen  03/28/2021    DTaP/Tdap/Td vaccine (2 - Td or Tdap) 07/09/2029    Hepatitis C screen  Completed    HIV screen  Completed    Hepatitis A vaccine  Aged Out    Hib vaccine  Aged Out    Meningococcal (ACWY) vaccine  Aged Out    Pneumococcal 0-64 years Vaccine  Aged Out             (applicable per patient's age: Cancer Screenings, Depression Screening, Fall Risk Screening, Immunizations)    LDL Cholesterol (mg/dL)   Date Value   16/04/9603 94     AST (U/L)   Date Value   10/17/2020 21     ALT (U/L)   Date Value   10/17/2020 15     BUN (mg/dL)   Date Value   54/03/8118 13      (goal A1C is < 7)   (goal LDL is <100) need 30-50% reduction from baseline     BP Readings from Last 3 Encounters:   05/01/21 (!) 148/61   10/17/20 126/75   09/29/20 119/74    (goal BP 120/80)      All Future Testing planned in [facility]:  Lab Frequency Next Occurrence       Next Visit Date:  Future Appointments   Date Time Provider Department Center   06/22/2021  9:15 AM Franklyn Lor, APRN - CNP [facility]            Patient Active Problem List:     Fatigue     Constipation     Chronic sinusitis     Migraine     Chronic bilateral low back pain without sciatica     Postnasal drip     Anxiety     Neck pain     Atopic rhinitis     Obesity with body mass index 30 or greater     Pneumonia of right lung due to infectious organism     Opioid dependence in remission (HCC)     Lower abdominal pain     GI bleed     Abdominal pain, LUQ     Functional diarrhea     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     SBO (small bowel obstruction) (HCC) versus ileus     Chest wall pain     History of fall

## 2021-06-18 ENCOUNTER — Encounter

## 2021-06-18 NOTE — Telephone Encounter (Signed)
Pt called in stating he was called in to work out of town tmrw and can not keep his appt on 06/22/21 with A.Kent. Informed pt to be evaluated at walk-in clinic. Pt voiced understanding. Will call back office to schedule appt.

## 2021-06-18 NOTE — Telephone Encounter (Signed)
Courtesy notification of patient's concern.Patient reports that he is having difficulty with obtaining a prescription refill and states that he will run out of his Gabapentin tomorrow and also has to leave town for work. Patient asking for refill on Gabapentin tonight to last over the weekend until his appointment next week. Patient states that he takes Gabapentin 600mg , 3 times daily and uses on [place]. Patient further expresses interest on weaning off of medication and would like to discuss how to do that.

## 2021-06-19 NOTE — Telephone Encounter (Signed)
Patient called asking if you could fill script or just send in a few tablets until his appointment on Monday with Morrie Sheldon or if he can come in later today to get the refill. He works this weekend and without his medication his back will start hurting really bad and he drives all day for work and he won't be able to do his job properly if he's in pain every day.

## 2021-06-19 NOTE — Telephone Encounter (Signed)
I will not send refills, he was notified in September when I sent refills that he must be seen in 2 months for any further refills.  He was notified and voiced understanding at that time.  He can take his medication twice daily until he is seen on the fifth

## 2021-06-19 NOTE — Telephone Encounter (Signed)
Pt scheduled with Morrie Sheldon for 12/5 due to pcp has no sooner availability. Requesting Rx til his appt. Please advise.

## 2021-06-19 NOTE — Telephone Encounter (Signed)
He has no upcoming appointments and has not kept previous appointments, as discussed, to continue the prescription.

## 2021-06-22 ENCOUNTER — Encounter: Payer: PRIVATE HEALTH INSURANCE | Attending: Gerontology | Primary: Family

## 2021-06-22 ENCOUNTER — Encounter: Admit: 2021-06-22 | Discharge: 2021-06-22 | Payer: PRIVATE HEALTH INSURANCE | Attending: Gerontology | Primary: Family

## 2021-06-22 DIAGNOSIS — G8929 Other chronic pain: Secondary | ICD-10-CM

## 2021-06-22 MED ORDER — GABAPENTIN 400 MG PO CAPS
400 MG | ORAL_CAPSULE | Freq: Three times a day (TID) | ORAL | 0 refills | Status: DC
Start: 2021-06-22 — End: 2021-07-21

## 2021-06-22 NOTE — Telephone Encounter (Signed)
Pt notified- Pt here for an appt with Franklyn Lor.
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
## 2021-07-21 ENCOUNTER — Encounter

## 2021-07-21 MED ORDER — GABAPENTIN 400 MG PO CAPS
400 MG | ORAL_CAPSULE | ORAL | 0 refills | Status: DC
Start: 2021-07-21 — End: 2021-08-24

## 2021-07-21 NOTE — Telephone Encounter (Signed)
Last OV: 06/22/2021

## 2021-07-21 NOTE — Telephone Encounter (Signed)
This medication was sent in by Victorino Dike today.

## 2021-07-21 NOTE — Telephone Encounter (Signed)
-----   Message from Truitt Merle sent at 07/21/2021  9:16 AM EST -----  Subject: Refill Request    QUESTIONS  Name of Medication? gabapentin (NEURONTIN) 400 MG capsule  Patient-reported dosage and instructions? 400 MG 3 tabs 3 times a day as   needed   How many days do you have left? 1  Preferred Pharmacy? [id number]  Pharmacy phone number (if available)? [phone]  ---------------------------------------------------------------------------  --------------  CALL BACK INFO  What is the best way for the office to contact you? OK to leave message on   voicemail  Preferred Call Back Phone Number? [phone]  ---------------------------------------------------------------------------  --------------  SCRIPT ANSWERS  Relationship to Patient? Self

## 2021-07-30 ENCOUNTER — Encounter: Payer: PRIVATE HEALTH INSURANCE | Attending: Family | Primary: Family

## 2021-08-06 ENCOUNTER — Encounter: Payer: PRIVATE HEALTH INSURANCE | Attending: Family | Primary: Family

## 2021-08-21 ENCOUNTER — Encounter

## 2021-08-24 MED ORDER — GABAPENTIN 400 MG PO CAPS
400 MG | ORAL_CAPSULE | Freq: Three times a day (TID) | ORAL | 0 refills | Status: DC
Start: 2021-08-24 — End: 2021-08-25

## 2021-08-24 NOTE — Telephone Encounter (Signed)
Must keep 2/7 appointment

## 2021-08-25 ENCOUNTER — Ambulatory Visit: Admit: 2021-08-25 | Discharge: 2021-08-25 | Payer: PRIVATE HEALTH INSURANCE | Attending: Family | Primary: Family

## 2021-08-25 DIAGNOSIS — F419 Anxiety disorder, unspecified: Secondary | ICD-10-CM

## 2021-08-25 MED ORDER — GABAPENTIN 600 MG PO TABS
600 MG | ORAL_TABLET | Freq: Three times a day (TID) | ORAL | 2 refills | Status: DC
Start: 2021-08-25 — End: 2021-11-26

## 2021-08-25 NOTE — Progress Notes (Signed)
[facility]  [facility]  [address] [facility]  AMBULATORY CARE CENTER MAIN [place] [address]  Dept: [phone]  Dept Fax: [phone]    David [name] (DOB:  1986/05/21) is a 36 y.o. male,Established patient, here for evaluation of the following chief complaint(s):  Back Pain (Follow up)         ASSESSMENT/PLAN:  1. Anxiety  -     gabapentin (NEURONTIN) 600 MG tablet; Take 1 tablet by mouth 3 times daily for 30 days., Disp-90 tablet, R-2Normal  2. Chronic bilateral low back pain without sciatica  -     gabapentin (NEURONTIN) 600 MG tablet; Take 1 tablet by mouth 3 times daily for 30 days., Disp-90 tablet, R-2Normal    Did agree to increase gabapentin today, for pain control as well as improved anxiety control.  Advised patient that I do not believe Klonopin is an appropriate action at this time, patient already has abuse history and admits to buying medications off the street with recent accidental overdose.  Discussed starting a different class of medication such as Effexor, patient declines.    Return in about 3 months (around 11/22/2021) for Anxiety.         Subjective   SUBJECTIVE/OBJECTIVE:  Asking to go back up on gabapentin, feels pain is not well controlled with lower dose.    Had [facility] set up, but insurance lapsed so now her cannot get back in until May. Anxiety I affecting everything, kids ee the change.  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     Bought xanax off the stress, wound up with accidental overdose of fentanyl. Also currently not driving due to an OVI.   Last xanax use was a few months ago, Klonopin about 6 days ago.    Has tried meds in the past, diagnosed back in 2009. Zoloft, Paxil and Lexapro. Buspar made him feel strange. Also had seroquel before.  Patient states he does not like the "inhibitors "and does not wish to start any of them.  Asking  for Klonopin today    Back Pain  This is a chronic problem. The problem has been gradually worsening since onset. The pain is present in the lumbar spine.   Anxiety  Presents for follow-up visit. Symptoms include decreased concentration, depressed mood (becuase the anxiety has rapidly worsened), excessive worry, irritability, nervous/anxious behavior, panic and restlessness.         Review of Systems   Constitutional:  Positive for irritability.   Musculoskeletal:  Positive for back pain.   Psychiatric/Behavioral:  Positive for decreased concentration. The patient is nervous/anxious.         Objective     DIAGNOSTIC FINDINGS:  CBC:  Lab Results   Component Value Date/Time    WBC 8.4 10/17/2020 07:41 AM    HGB 13.6 10/17/2020 07:41 AM    PLT 235 10/17/2020 07:41 AM    PLT 203 03/31/2010 10:15 AM       BMP:    Lab Results   Component Value Date/Time    NA 143 10/17/2020 05:58 AM    K 4.0 10/17/2020 05:58 AM    CL 108 10/17/2020 05:58 AM    CO2 24 10/17/2020 05:58 AM    BUN 13 10/17/2020 05:58 AM    CREATININE 0.65 10/17/2020 05:58 AM    GLUCOSE 96 10/17/2020 05:58 AM  GLUCOSE 96 03/31/2010 10:15 AM       HEMOGLOBIN A1C: No results found for: LABA1C    FASTING LIPID PANEL:  Lab Results   Component Value Date    CHOL 143 02/21/2012    HDL 39 (L) 02/21/2012    TRIG 49 02/21/2012       Physical Exam  Vitals and nursing note reviewed.   Constitutional:       General: He is not in acute distress.     Appearance: He is well-developed.   HENT:      Head: Normocephalic and atraumatic.      Right Ear: External ear normal.      Left Ear: External ear normal.      Mouth/Throat:      Mouth: Mucous membranes are moist.   Eyes:      General: No scleral icterus.     Pupils: Pupils are equal, round, and reactive to light.   Cardiovascular:      Rate and Rhythm: Normal rate and regular rhythm.   Pulmonary:      Effort: Pulmonary effort is normal.      Breath sounds: Normal breath sounds.   Abdominal:      General: Bowel sounds are  normal.      Palpations: Abdomen is soft. There is no mass.   Musculoskeletal:         General: Tenderness present.      Cervical back: Normal range of motion and neck supple.      Lumbar back: Tenderness (left paraspinals only) present.        Back:    Skin:     General: Skin is warm and dry.   Neurological:      General: No focal deficit present.      Mental Status: He is alert and oriented to person, place, and time. Mental status is at baseline.      Coordination: Coordination normal.      Gait: Gait normal.   Psychiatric:         Mood and Affect: Mood is anxious and depressed.         Behavior: Behavior normal. Behavior is cooperative.         Thought Content: Thought content normal.         Judgment: Judgment normal.      Comments: Patient speech is slowed, not making eye contact during visit          An electronic signature was used to authenticate this note.    --Curt Bears, APRN - CNP

## 2021-10-03 ENCOUNTER — Emergency Department: Admit: 2021-10-04 | Payer: PRIVATE HEALTH INSURANCE | Primary: Family

## 2021-10-03 DIAGNOSIS — R0789 Other chest pain: Secondary | ICD-10-CM

## 2021-10-03 NOTE — Discharge Instructions (Signed)
PLEASE RETURN TO THE EMERGENCY DEPARTMENT IMMEDIATELY if your symptoms worsen in anyway or in 8-12 hours if not improved for re-evaluation.  You should immediately return to the ER for symptoms such as increasing pain, bloody stool, fever, a feeling of passing out, light headed, dizziness, chest pain, shortness of breath, persistent nausea and/or vomiting, numbness or weakness to the arms or legs, coolness or color change of the arms or legs.      Take your medication as indicated and prescribed.  If you are given an antibiotic then, make sure you get the prescription filled and take the antibiotics until finished.      Please understand that at this time there is no evidence for a more serious underlying process, but that early in the process of an illness or injury, an emergency department workup can be falsely reassuring.  You should contact your family doctor within the next 24 hours for a follow up appointment    THANK YOU!!!    From [facility]    On behalf of the Emergency Department staff at [facility], I would like to thank you for giving us the opportunity to address your health care needs and concerns.    We hope that during your visit, our service was delivered in a professional and caring manner. Please keep  Health in mind as we walk with you down the path to your own personal wellness.     Please expect an automated text message or email from us so we can ask a few questions about your health and progress. Based on your answers, a clinician may call you back to offer help and instructions.    Please understand that early in the process of an illness or injury, an emergency department workup can be falsely reassuring.  If you notice any worsening, changing or persistent symptoms please call your family doctor or return to the ER immediately.     Tell us how we did during your visit at [email or web address]   and let us know about your experience

## 2021-10-03 NOTE — ED Provider Notes (Addendum)
[facility] Emergency Department  [phone]  [facility] [place] [id number]  Phone: [phone]  Fax: [phone]      Attending Physician Attestation    I performed a history and physical examination of the patient and discussed management with the mid level provideer. I reviewed the mid level provider's note and agree with the documented findings and plan of care. Any areas of disagreement are noted on the chart. I was personally present for the key portions of any procedures. I have documented in the chart those procedures where I was not present during the key portions. I have reviewed the emergency nurses triage note. I agree with the chief complaint, past medical history, past surgical history, allergies, medications, social and family history as documented unless otherwise noted below. Documentation of the HPI, Physical Exam and Medical Decision Making performed by mid level providers is based on my personal performance of the HPI, PE and MDM. For Physician Assistant/ Nurse Practitioner cases/documentation I have personally evaluated this patient and have completed at least one if not all key elements of the E/M (history, physical exam, and MDM). Additional findings are as noted.      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    SURGICAL HISTORY      has a past surgical history that includes pr egd transoral biopsy single/multiple (N/A, 03/23/2016).    CURRENT MEDICATIONS       Discharge Medication List as of 10/03/2021 10:36 PM        CONTINUE these medications which have NOT CHANGED    Details   methadone 10 MG/5ML solution Take 110 mg by mouth daily.Historical Med      gabapentin (NEURONTIN) 600 MG tablet Take 1 tablet by mouth 3 times daily  for 30 days., Disp-90 tablet, R-2Normal             ALLERGIES     is allergic to codeine, nubain [nalbuphine hcl], and zofran [ondansetron].    FAMILY HISTORY     He indicated that the status of his other is unknown.     family history includes Cancer (age of onset: 7390) in an other family member.    SOCIAL HISTORY      reports that he quit smoking about 9 years ago. His smoking use included cigarettes. He has a 5.00 pack-year smoking history. His smokeless tobacco use includes chew. He reports current alcohol use. He reports current drug use. Drugs: Other-see comments, IV, and Opiates .      DIAGNOSTIC RESULTS     EKG: All EKG's are interpreted by the Emergency Department Physician who either signs or Co-signs this chart in the absence of a cardiologist.    Interpreted by No att. providers found     Rhythm: normal sinus   Rate: normal  Axis: normal  Ectopy: none  Conduction: normal  ST Segments: no acute change  T Waves: no acute change    Clinical Impression: normal sinus rhythm with no acute changes/normal EKG.  No acute infarction/ischemia noted.      RADIOLOGY:   Non-plain film images such as CT, Ultrasound and MRI are read by  the radiologist. Plain radiographic images are visualized and the radiologist interpretations are reviewed as follows:     XR CHEST PORTABLE   Final Result   No acute abnormality.             No results found.      LABS:  Results for orders placed or performed during the hospital encounter of 10/03/21   CBC with Auto Differential   Result Value Ref Range    WBC 10.0 3.5 - 11.0 k/uL    RBC 5.22 4.5 - 5.9 m/uL    Hemoglobin 15.5 13.5 - 17.5 g/dL    Hematocrit 19.1 41 - 53 %    MCV 88.4 80 - 100 fL    MCH 29.8 26 - 34 pg    MCHC 33.7 31 - 37 g/dL    RDW 47.8 29.5 - 62.1 %    Platelets 222 140 - 450 k/uL    MPV 8.0 6.0 - 12.0 fL    Seg Neutrophils 84 (H) 36 - 66 %    Lymphocytes 11 (L) 24 - 44 %    Monocytes 5 2 - 11 %    Eosinophils % 0 (L) 1 - 4 %    Basophils 0 0 - 2 %    Segs Absolute 8.30  (H) 1.8 - 7.7 k/uL    Absolute Lymph # 1.10 1.0 - 4.8 k/uL    Absolute Mono # 0.50 0.1 - 1.2 k/uL    Absolute Eos # 0.00 0.0 - 0.4 k/uL    Basophils Absolute 0.00 0.0 - 0.2 k/uL   BMP   Result Value Ref Range    Glucose 113 (H) 70 - 99 mg/dL    BUN 10 6 - 20 mg/dL    Creatinine 3.08 (L) 0.70 - 1.20 mg/dL    Est, Glom Filt Rate >60 >60 mL/min/1.62m2    Calcium 10.4 8.6 - 10.4 mg/dL    Sodium 657 846 - 962 mmol/L    Potassium 4.4 3.7 - 5.3 mmol/L    Chloride 101 98 - 107 mmol/L    CO2 25 20 - 31 mmol/L    Anion Gap 14 9 - 17 mmol/L   Lipase   Result Value Ref Range    Lipase 16 13 - 60 U/L   Lactic Acid   Result Value Ref Range    Lactic Acid 2.0 0.5 - 2.2 mmol/L   Urine Drug Screen   Result Value Ref Range    Amphetamine Screen, Ur NEGATIVE NEGATIVE    Barbiturate Screen, Ur NEGATIVE NEGATIVE    Benzodiazepine Screen, Urine NEGATIVE NEGATIVE    Cocaine Metabolite, Urine NEGATIVE NEGATIVE    Methadone Screen, Urine POSITIVE (A) NEGATIVE    Opiates, Urine NEGATIVE NEGATIVE    Phencyclidine, Urine NEGATIVE NEGATIVE    Cannabinoid Scrn, Ur POSITIVE (A) NEGATIVE    Oxycodone Screen, Ur NEGATIVE NEGATIVE    Fentanyl, Ur NEGATIVE NEGATIVE    Test Information       Assay provides medical screening only.  The absence of expected drug(s) and/or metabolite(s) may indicate diluted or adulterated urine, limitations of testing or timing of collection.   Urinalysis with Reflex to Culture    Specimen: Urine   Result Value Ref Range    Color, UA Yellow Yellow    Turbidity UA Clear Clear    Glucose, Ur NEGATIVE NEGATIVE    Bilirubin Urine NEGATIVE NEGATIVE    Ketones, Urine TRACE (A) NEGATIVE    Specific Gravity, UA  1.025 1.005 - 1.030    Urine Hgb TRACE (A) NEGATIVE    pH, UA 6.5 5.0 - 8.0    Protein, UA NEGATIVE NEGATIVE    Urobilinogen, Urine Normal Normal    Nitrite, Urine NEGATIVE NEGATIVE    Leukocyte Esterase, Urine NEGATIVE NEGATIVE   Troponin   Result Value Ref Range    Troponin, High Sensitivity <6 0 - 22 ng/L   Ethanol    Result Value Ref Range    Ethanol <10 <10 mg/dL    Ethanol percent <2.820 <0.010 %   Hepatic Function Panel   Result Value Ref Range    Albumin 5.1 3.5 - 5.2 g/dL    Alkaline Phosphatase 91 40 - 129 U/L    ALT 85 (H) 5 - 41 U/L    AST 40 (H) <40 U/L    Total Bilirubin 0.5 0.3 - 1.2 mg/dL    Bilirubin, Direct 0.1 <0.3 mg/dL    Bilirubin, Indirect 0.4 0.0 - 1.0 mg/dL    Total Protein 8.2 6.4 - 8.3 g/dL    Albumin/Globulin Ratio 1.6 1.0 - 2.5   Microscopic Urinalysis   Result Value Ref Range    WBC, UA 0 TO 2 0 - 5 /HPF    RBC, UA 5 TO 10 0 - 2 /HPF    Epithelial Cells UA None 0 - 5 /HPF    Mucus, UA 3+ (A) None    Other Observations UA (A) NOT REQ.     Utilizing a urinalysis as the only screening method to exclude a potential uropathogen can be unreliable in many patient populations.  Rapid screening tests are less sensitive than culture and if UTI is a clinical possibility, culture should be considered despite a negative urinalysis.           EMERGENCY DEPARTMENT COURSE:   Vitals:    Vitals:    10/03/21 2003   BP: (!) 150/91   Pulse: 88   Resp: 22   Temp: 98 ??F (36.7 ??C)   TempSrc: Oral   SpO2: 97%   Weight: 86.2 kg (190 lb)   Height: 5\' 8"  (1.727 m)     -------------------------  BP: (!) 150/91, Temp: 98 ??F (36.7 ??C), Heart Rate: 88, Resp: 22      PERTINENT ATTENDING PHYSICIAN COMMENTS:    Patient presents with initially chest discomfort shortly after eating at [employer] .  He also reports generalized abdominal pain.  Has had significant nausea and vomiting associated with it.  Denies any cardiac or intrathoracic or abdominal pathology and states he is otherwise healthy.  No trauma.  No fevers.  No other symptoms or concerns at this time.  Plan to be initiated further work-up.    Patient feels much better on reevaluation.  Work-up shows no acute significant findings other than some elevated liver function studies.  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  Most likely  food poisoning.  He has a benign abdominal exam.  I do not feel abdominal imaging is necessary.  Urinalysis shows no acute significant findings other than some mild dehydration.  He did receive IV fluids.  Someone is here to drive him home and advised to follow-up with his PCP return right away if worsening or for any new or concerning symptoms.  They are comfortable with the plan.    Evaluation of the abdomen and back is benign. No guarding, peritoneal signs, sepsis, toxicity, significant tenderness, life threatening or serious etiology was  noted.   The patient is tolerating PO intake.  The patient appears stable for discharge and has been instructed to return immediately if the symptoms worsen in any way, or in 1-2 days if not improved for re-evaluation.  We also discussed returning to the Emergency Department immediately if new or worsening symptoms occur. We have discussed the symptoms which are most concerning (e.g., abdominal or back pain, bloody stools, fever, a feeling of passing out, light headed, dizziness, chest pain, shortness of breath, persistent nausea and/or vomiting, numbness or weakness to the arms or legs, coolness or color change of the arms or legs) that necessitate immediate return.     The patient understands that at this time there is no evidence for a more malignant underlying process, but the patient also understands that early in the process of an illness or injury, an emergency department workup can be falsely reassuring.  Routine discharge counseling was given, and the patient understands that worsening, changing or persistent symptoms should prompt an immediate call or follow up with their primary physician or return to the emergency department. The importance of appropriate follow up was also discussed.  I have reviewed the disposition diagnosis with the patient and or their family/guardian.  I have answered their questions and given discharge instructions.  They voiced understanding  of these instructions and did not have any further questions or complaints.      CONSULTS:    None    CRITICAL CARE:     None    PROCEDURES:    None    FINAL IMPRESSION      1. Atypical chest pain    2. Abdominal pain, epigastric    3. Nausea and vomiting, unspecified vomiting type    4. Elevated liver enzymes          DISPOSITION/PLAN   DISPOSITION Decision To Discharge 10/03/2021 10:34:47 PM      Condition on Disposition    Improved    PATIENT REFERRED TO:  Curt Bears, APRN - CNP  [address]  [place] [address]  [phone]    Schedule an appointment as soon as possible for a visit in 2 days      DISCHARGE MEDICATIONS:  Discharge Medication List as of 10/03/2021 10:36 PM        START taking these medications    Details   promethazine (PHENERGAN) 25 MG tablet Take 1 tablet by mouth every 6 hours as needed for Nausea WARNING:  May cause drowsiness.  May impair ability to operate vehicles or machinery.  Do not use in combination with alcohol., Disp-10 tablet, R-0Normal             (Please note that portions of this note were completed with a voice recognition program.  Efforts were made to edit the dictations but occasionally words are mis-transcribed.)    Berline Chough, DO, DO  Attending Emergency Physician        Berline Chough, DO  10/03/21 2350       Berline Chough, DO  10/04/21 [phone]

## 2021-10-03 NOTE — ED Provider Notes (Signed)
[facility] [place] [facility]  [phone] [facility] [address]  [place] [id number]  Phone: [phone]  Fax: [phone]        Pt Name: David DukesDerek D Coull  MRN: [id number]  Birthdate 09/11/1985  Date of evaluation: 10/03/21    CHIEFCOMPLAINT       Chief Complaint   Patient presents with    Abdominal Pain    Chest Pain    Shortness of Breath       HISTORY OF PRESENT ILLNESS (Location/Symptom, Timing/Onset, Context/Setting, Quality, Duration, Modifying Factors, Severity)      David [name] is a 36 y.o. male who presents to the ED via EMS with chest pain, painful breathing, nausea.  Patient reports that approximately 1 hour ago he was eating at [employer] and afterwards to get back to his hotel and suddenly began experiencing a severe pain in his chest and felt like he had painful breathing.  He has pain into his stomach.  Denies history of similar symptoms.  He has associated dry heaves and nausea but denies any vomiting.  He does have a history of anxiety and is literally shaking.  Denies any fever, chills, lightheadedness, syncope, urinary complaints, bowel changes, or any other concerns at this time.    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  Surgical History:  has a past surgical history that includes pr egd transoral biopsy single/multiple (N/A, 03/23/2016).  Social History:  reports that he quit smoking about 9 years ago. His smoking use included cigarettes. He has a 5.00 pack-year smoking history. His smokeless tobacco use includes chew. He reports current alcohol use. He reports current drug use. Drugs: Other-see comments, IV, and Opiates .  Family History: He indicated that the status of his other is unknown.   family history includes Cancer (age of onset: 7090) in an other family member.  Psychiatric History: None    Allergies: Codeine,  Nubain [nalbuphine hcl], and Zofran [ondansetron]    Home Medications:   Prior to Admission medications    Medication Sig Start Date End Date Taking? Authorizing Provider   methadone 10 MG/5ML solution Take 110 mg by mouth daily.   Yes Historical Provider, MD   promethazine (PHENERGAN) 25 MG tablet Take 1 tablet by mouth every 6 hours as needed for Nausea WARNING:  May cause drowsiness.  May impair ability to operate vehicles or machinery.  Do not use in combination with alcohol. 10/03/21 10/10/21 Yes Daniel E Neumeyer, DO   gabapentin (NEURONTIN) 600 MG tablet Take 1 tablet by mouth 3 times daily for 30 days. 08/25/21 09/24/21  Curt Bearsachel J Brooks, APRN - CNP       REVIEW OF SYSTEMS  (2-9 systems for level 4, 10 ormore for level 5)      Review of Systems  See HPI.    PHYSICAL EXAM  (up to 7 for level 4, 8 or more for level 5)      INITIAL VITALS:  height is 5\' 8"  (1.727 m) and weight is 86.2 kg (190 lb). His oral temperature is 98 ??F (36.7 ??C). His blood pressure is 150/91 (abnormal) and his pulse is 88. His respiration is 22 and oxygen saturation is 97%.      Vital signs reviewed.    Physical Exam    General:  Alert, cooperative, well-groomed,  well-nourished, appears stated age, and shaking excessively to his entire body and writhing in pain.   Head:  Normocephalic, atraumatic, and without obvious abnormality.   Eyes:  Sclerae/conjunctivae clear without injection, pallor, or icterus. Corneas clear without opacities. EOM's intact.   ENT: Ears and nose are all without obvious masses lesion or deformity.    Neck: Supple and symmetrical. Trachea midline. No jugular venous distention.   Lungs:   No respiratory distress. Clear to auscultation bilaterally. No wheezes, rhonchi, or rales.  No crepitus.   Heart:  Regular rate. Regular rhythm. No murmurs, rubs, or gallops.   Abdomen:   Normoactive bowel sounds.  Soft and nondistended.  There is mild tenderness palpation to the epigastric region without guarding or rebound.  No  palpable masses.    Extremities: Warm and dry without erythema or edema.    Skin: Soft, good turgor, and well-hydrated.    Neurologic: GCS is 15 and no focal deficits are appreciated. Normal gait. Grossly normal motor and sensation. Speech clear.   Psychiatric: Normal mood and affect. Normal behavior. Coherent thought process.     DIFFERENTIAL DIAGNOSIS / MDM     Patient presents to the emergency department for the complaint as described above.  Vital signs are stable and for all the excessive shaking he is doing, he is not tachycardic.  Other than his physical state, he is well-appearing and nontoxic.  Will start with labs, EKG, chest x-ray, give some reflux medications, and reassess.  I perused his EHR and he does have a history of drug overdose in October of last year and is currently on methadone (patient inform the nurse this).  Question whether there are illicit drugs involved.  We will obtain pertinent labs, EKG, chest x-ray, and give meds for reflux.    This patient was seen by the attending physician and they agreed with the assessment and plan.    PLAN (LABS / IMAGING / EKG):  Orders Placed This Encounter   Procedures    XR CHEST PORTABLE    CBC with Auto Differential    BMP    Lipase    Lactic Acid    Urine Drug Screen    Urinalysis with Reflex to Culture    Troponin    Ethanol    Hepatic Function Panel    Microscopic Urinalysis    EKG 12 Lead    Saline lock IV       MEDICATIONS ORDERED:  Orders Placed This Encounter   Medications    famotidine (PEPCID) 20 mg in sodium chloride (PF) 0.9 % 10 mL injection    pantoprazole (PROTONIX) 40 mg in sodium chloride (PF) 0.9 % 10 mL injection    promethazine (PHENERGAN) 25 MG tablet     Sig: Take 1 tablet by mouth every 6 hours as needed for Nausea WARNING:  May cause drowsiness.  May impair ability to operate vehicles or machinery.  Do not use in combination with alcohol.     Dispense:  10 tablet     Refill:  0       Controlled Substances Monitoring:     DIAGNOSTIC  RESULTS     EKG: All EKG's are interpreted by the Emergency Department Physician who either signs or Co-signs this chart in the absenceof a cardiologist.    See ED attending note.    RADIOLOGY: All images are read by the radiologist and their interpretations are reviewed.     XR CHEST PORTABLE    Result Date: 10/03/2021  EXAMINATION: ONE XRAY VIEW OF THE CHEST 10/03/2021 8:24 pm COMPARISON: 04/13/2015 HISTORY: ORDERING SYSTEM PROVIDED HISTORY: sudden chest pain after eating and hurts to breath TECHNOLOGIST PROVIDED HISTORY: sudden chest pain after eating and hurts to breath Reason for Exam: sudden chest pain after eating and hurts to breath FINDINGS: The lungs are clear.Normal cardiomediastinal silhouette. No pleural effusion or pneumothorax. No acute osseous abnormality.     No acute abnormality.       LABS:  Results for orders placed or performed during the hospital encounter of 10/03/21   CBC with Auto Differential   Result Value Ref Range    WBC 10.0 3.5 - 11.0 k/uL    RBC 5.22 4.5 - 5.9 m/uL    Hemoglobin 15.5 13.5 - 17.5 g/dL    Hematocrit 78.2 41 - 53 %    MCV 88.4 80 - 100 fL    MCH 29.8 26 - 34 pg    MCHC 33.7 31 - 37 g/dL    RDW 95.6 21.3 - 08.6 %    Platelets 222 140 - 450 k/uL    MPV 8.0 6.0 - 12.0 fL    Seg Neutrophils 84 (H) 36 - 66 %    Lymphocytes 11 (L) 24 - 44 %    Monocytes 5 2 - 11 %    Eosinophils % 0 (L) 1 - 4 %    Basophils 0 0 - 2 %    Segs Absolute 8.30 (H) 1.8 - 7.7 k/uL    Absolute Lymph # 1.10 1.0 - 4.8 k/uL    Absolute Mono # 0.50 0.1 - 1.2 k/uL    Absolute Eos # 0.00 0.0 - 0.4 k/uL    Basophils Absolute 0.00 0.0 - 0.2 k/uL   BMP   Result Value Ref Range    Glucose 113 (H) 70 - 99 mg/dL    BUN 10 6 - 20 mg/dL    Creatinine 5.78 (L) 0.70 - 1.20 mg/dL    Est, Glom Filt Rate >60 >60 mL/min/1.50m2    Calcium 10.4 8.6 - 10.4 mg/dL    Sodium 469 629 - 528 mmol/L    Potassium 4.4 3.7 - 5.3 mmol/L    Chloride 101 98 - 107 mmol/L    CO2 25 20 - 31 mmol/L    Anion Gap 14 9 - 17 mmol/L   Lipase    Result Value Ref Range    Lipase 16 13 - 60 U/L   Lactic Acid   Result Value Ref Range    Lactic Acid 2.0 0.5 - 2.2 mmol/L   Urine Drug Screen   Result Value Ref Range    Amphetamine Screen, Ur NEGATIVE NEGATIVE    Barbiturate Screen, Ur NEGATIVE NEGATIVE    Benzodiazepine Screen, Urine NEGATIVE NEGATIVE    Cocaine Metabolite, Urine NEGATIVE NEGATIVE    Methadone Screen, Urine POSITIVE (A) NEGATIVE    Opiates, Urine NEGATIVE NEGATIVE    Phencyclidine, Urine NEGATIVE NEGATIVE    Cannabinoid Scrn, Ur POSITIVE (A) NEGATIVE    Oxycodone Screen, Ur NEGATIVE NEGATIVE    Fentanyl, Ur NEGATIVE NEGATIVE    Test Information       Assay provides medical screening only.  The absence of expected drug(s) and/or metabolite(s) may indicate diluted or adulterated urine, limitations of testing or timing of collection.   Urinalysis with Reflex to Culture    Specimen: Urine   Result Value Ref Range    Color, UA Yellow Yellow    Turbidity UA Clear Clear  Glucose, Ur NEGATIVE NEGATIVE    Bilirubin Urine NEGATIVE NEGATIVE    Ketones, Urine TRACE (A) NEGATIVE    Specific Gravity, UA 1.025 1.005 - 1.030    Urine Hgb TRACE (A) NEGATIVE    pH, UA 6.5 5.0 - 8.0    Protein, UA NEGATIVE NEGATIVE    Urobilinogen, Urine Normal Normal    Nitrite, Urine NEGATIVE NEGATIVE    Leukocyte Esterase, Urine NEGATIVE NEGATIVE   Troponin   Result Value Ref Range    Troponin, High Sensitivity <6 0 - 22 ng/L   Ethanol   Result Value Ref Range    Ethanol <10 <10 mg/dL    Ethanol percent <1.610 <0.010 %   Hepatic Function Panel   Result Value Ref Range    Albumin 5.1 3.5 - 5.2 g/dL    Alkaline Phosphatase 91 40 - 129 U/L    ALT 85 (H) 5 - 41 U/L    AST 40 (H) <40 U/L    Total Bilirubin 0.5 0.3 - 1.2 mg/dL    Bilirubin, Direct 0.1 <0.3 mg/dL    Bilirubin, Indirect 0.4 0.0 - 1.0 mg/dL    Total Protein 8.2 6.4 - 8.3 g/dL    Albumin/Globulin Ratio 1.6 1.0 - 2.5   Microscopic Urinalysis   Result Value Ref Range    WBC, UA 0 TO 2 0 - 5 /HPF    RBC, UA 5 TO 10 0 - 2  /HPF    Epithelial Cells UA None 0 - 5 /HPF    Mucus, UA 3+ (A) None    Other Observations UA (A) NOT REQ.     Utilizing a urinalysis as the only screening method to exclude a potential uropathogen can be unreliable in many patient populations.  Rapid screening tests are less sensitive than culture and if UTI is a clinical possibility, culture should be considered despite a negative urinalysis.     EKG 12 Lead   Result Value Ref Range    Ventricular Rate 77 BPM    Atrial Rate 77 BPM    P-R Interval 120 ms    QRS Duration 94 ms    Q-T Interval 396 ms    QTc Calculation (Bazett) 448 ms    P Axis 48 degrees    R Axis 26 degrees    T Axis 39 degrees       EMERGENCY DEPARTMENT COURSE     ED Course as of 10/06/21 0835   Sat Oct 03, 2021   2103 CBC is unremarkable.  BMP is grossly unremarkable.  LFTs demonstrate slight bump in AST and ALT but are otherwise unremarkable.  Ethanol is normal.  Troponin is normal.  Lipase is normal.  Lactic is normal.  Chest x-ray is unremarkable.  Patient was updated regarding all results.  He seems quite calm and sleepy and asked if he given any thing to him to make him feel a little sleepy but reports that is not the case.  [MG]      ED Course User Index  [MG] Lisset Ketchem, PA-C        Vitals:    Vitals:    10/03/21 2003   BP: (!) 150/91   Pulse: 88   Resp: 22   Temp: 98 ??F (36.7 ??C)   TempSrc: Oral   SpO2: 97%   Weight: 86.2 kg (190 lb)   Height:  (1.727 m)     -------------------------  BP: (!) 150/91, Temp: 98 ??F (36.7 ??C), Heart Rate:  88, Resp: 22      RE-EVALUATION:  See ED Course notes above.    9:20 PM EDT Attending to complete all remaining care, diagnosis and disposition for this patient.  We discussed this patient prior to my departure.  My note will be refreshed to reflect these details, though I was not actively involved in this patient's care following the time stamp.      CONSULTS:  None    PROCEDURES:  None    FINAL IMPRESSION      1. Atypical chest pain    2.  Abdominal pain, epigastric    3. Nausea and vomiting, unspecified vomiting type    4. Elevated liver enzymes          DISPOSITION / PLAN     CONDITION ON DISPOSITION:   Good / Stable for discharge.     PATIENT REFERRED TO:  Curt Bears, APRN - CNP  [address]  [place] [address]  [phone]    Schedule an appointment as soon as possible for a visit in 2 days      DISCHARGE MEDICATIONS:  Discharge Medication List as of 10/03/2021 10:36 PM        START taking these medications    Details   promethazine (PHENERGAN) 25 MG tablet Take 1 tablet by mouth every 6 hours as needed for Nausea WARNING:  May cause drowsiness.  May impair ability to operate vehicles or machinery.  Do not use in combination with alcohol., Disp-10 tablet, R-0Normal             Ranell Patrick, PA-C   Emergency Medicine Physician Assistant    (Please note that portions of this note were completed with a voice recognition program.  Efforts were made to edit the dictations but occasionally words aremis-transcribed.)        Ranell Patrick, PA-C  10/06/21 8242

## 2021-10-04 ENCOUNTER — Inpatient Hospital Stay
Admit: 2021-10-04 | Discharge: 2021-10-04 | Disposition: A | Discharge status: Home / Self Care | Payer: PRIVATE HEALTH INSURANCE | Attending: Emergency Medicine

## 2021-10-04 LAB — URINE DRUG SCREEN
Amphetamine Screen, Ur: NEGATIVE
Barbiturate Screen, Ur: NEGATIVE
Benzodiazepine Screen, Urine: NEGATIVE
Cannabinoid Scrn, Ur: POSITIVE — AB
Cocaine Metabolite, Urine: NEGATIVE
Fentanyl, Ur: NEGATIVE
Methadone Screen, Urine: POSITIVE — AB
Opiates, Urine: NEGATIVE
Oxycodone Screen, Ur: NEGATIVE
Phencyclidine, Urine: NEGATIVE

## 2021-10-04 LAB — BASIC METABOLIC PANEL
Anion Gap: 14 mmol/L (ref 9–17)
BUN: 10 mg/dL (ref 6–20)
CO2: 25 mmol/L (ref 20–31)
Calcium: 10.4 mg/dL (ref 8.6–10.4)
Chloride: 101 mmol/L (ref 98–107)
Creatinine: 0.66 mg/dL — ABNORMAL LOW (ref 0.70–1.20)
Est, Glom Filt Rate: >60 mL/min/{1.73_m2} (ref >60)
Glucose: 113 mg/dL — ABNORMAL HIGH (ref 70–99)
Potassium: 4.4 mmol/L (ref 3.7–5.3)
Sodium: 140 mmol/L (ref 135–144)

## 2021-10-04 LAB — URINALYSIS WITH REFLEX TO CULTURE
Bilirubin Urine: NEGATIVE
Glucose, Ur: NEGATIVE
Leukocyte Esterase, Urine: NEGATIVE
Nitrite, Urine: NEGATIVE
Protein, UA: NEGATIVE
Specific Gravity, UA: 1.025 (ref 1.005–1.030)
Urobilinogen, Urine: NORMAL
pH, UA: 6.5 (ref 5.0–8.0)

## 2021-10-04 LAB — CBC WITH AUTO DIFFERENTIAL
Absolute Eos #: 0 10*3/uL (ref 0.0–0.4)
Absolute Lymph #: 1.1 10*3/uL (ref 1.0–4.8)
Absolute Mono #: 0.5 10*3/uL (ref 0.1–1.2)
Basophils Absolute: 0 10*3/uL (ref 0.0–0.2)
Basophils: 0 % (ref 0–2)
Eosinophils %: 0 % — ABNORMAL LOW (ref 1–4)
Hematocrit: 46.1 % (ref 41–53)
Hemoglobin: 15.5 g/dL (ref 13.5–17.5)
Lymphocytes: 11 % — ABNORMAL LOW (ref 24–44)
MCH: 29.8 pg (ref 26–34)
MCHC: 33.7 g/dL (ref 31–37)
MCV: 88.4 fL (ref 80–100)
MPV: 8 fL (ref 6.0–12.0)
Monocytes: 5 % (ref 2–11)
Platelets: 222 10*3/uL (ref 140–450)
RBC: 5.22 m/uL (ref 4.5–5.9)
RDW: 13.4 % (ref 12.5–15.4)
Seg Neutrophils: 84 % — ABNORMAL HIGH (ref 36–66)
Segs Absolute: 8.3 10*3/uL — ABNORMAL HIGH (ref 1.8–7.7)
WBC: 10 10*3/uL (ref 3.5–11.0)

## 2021-10-04 LAB — LIPASE: Lipase: 16 U/L (ref 13–60)

## 2021-10-04 LAB — HEPATIC FUNCTION PANEL
ALT: 85 U/L — ABNORMAL HIGH (ref 5–41)
AST: 40 U/L — ABNORMAL HIGH (ref <40)
Albumin/Globulin Ratio: 1.6 (ref 1.0–2.5)
Albumin: 5.1 g/dL (ref 3.5–5.2)
Alkaline Phosphatase: 91 U/L (ref 40–129)
Bilirubin, Direct: 0.1 mg/dL (ref <0.3)
Bilirubin, Indirect: 0.4 mg/dL (ref 0.0–1.0)
Total Bilirubin: 0.5 mg/dL (ref 0.3–1.2)
Total Protein: 8.2 g/dL (ref 6.4–8.3)

## 2021-10-04 LAB — MICROSCOPIC URINALYSIS
RBC, UA: 5 /HPF (ref 0–2)
WBC, UA: 0 /HPF (ref 0–5)

## 2021-10-04 LAB — LACTIC ACID: Lactic Acid: 2 mmol/L (ref 0.5–2.2)

## 2021-10-04 LAB — TROPONIN: Troponin, High Sensitivity: <6 ng/L (ref 0–22)

## 2021-10-04 LAB — ETHANOL
Ethanol percent: <0.01 % (ref <0.010)
Ethanol: <10 mg/dL (ref <10)

## 2021-10-04 MED ORDER — SODIUM CHLORIDE (PF) 0.9 % IJ SOLN
0.9 % | Freq: Once | INTRAMUSCULAR | Status: AC
Start: 2021-10-04 — End: 2021-10-03
  Administered 2021-10-04: 01:00:00 20 mg via INTRAVENOUS

## 2021-10-04 MED ORDER — PANTOPRAZOLE SODIUM 40 MG IV SOLR
40 MG | Freq: Once | INTRAVENOUS | Status: AC
Start: 2021-10-04 — End: 2021-10-03
  Administered 2021-10-04: 01:00:00 40 mg via INTRAVENOUS

## 2021-10-04 MED ORDER — PROMETHAZINE HCL 25 MG PO TABS
25 MG | ORAL_TABLET | Freq: Four times a day (QID) | ORAL | 0 refills | Status: AC | PRN
Start: 2021-10-04 — End: 2021-10-10

## 2021-10-04 MED FILL — FAMOTIDINE (PF) 20 MG/2 ML IV SOLN: 20 MG/2ML | INTRAVENOUS | Qty: 2

## 2021-10-04 MED FILL — PANTOPRAZOLE SODIUM 40 MG IV SOLR: 40 mg | INTRAVENOUS | Qty: 40

## 2021-10-05 LAB — EKG 12-LEAD
Atrial Rate: 77 {beats}/min
P Axis: 48 degrees
P-R Interval: 120 ms
Q-T Interval: 396 ms
QRS Duration: 94 ms
QTc Calculation (Bazett): 448 ms
R Axis: 26 degrees
T Axis: 39 degrees
Ventricular Rate: 77 {beats}/min

## 2021-11-24 ENCOUNTER — Encounter: Payer: PRIVATE HEALTH INSURANCE | Attending: Family | Primary: Internal Medicine

## 2021-11-26 ENCOUNTER — Encounter

## 2021-11-26 MED ORDER — GABAPENTIN 600 MG PO TABS
600 MG | ORAL_TABLET | Freq: Three times a day (TID) | ORAL | 0 refills | Status: AC
Start: 2021-11-26 — End: 2021-12-26

## 2021-11-26 NOTE — Telephone Encounter (Signed)
Attempt to call patient; went right to VM. Voicemail has not been set up yet.

## 2021-11-27 NOTE — Telephone Encounter (Signed)
Attempt to call patient; went right to VM. Voicemail has not been set up yet.

## 2021-11-30 NOTE — Telephone Encounter (Signed)
Letter sent to home to make appt

## 2021-12-23 ENCOUNTER — Encounter

## 2021-12-24 MED ORDER — GABAPENTIN 600 MG PO TABS
600 MG | ORAL_TABLET | ORAL | 0 refills | Status: DC
Start: 2021-12-24 — End: 2022-01-25

## 2021-12-24 NOTE — Telephone Encounter (Signed)
Last OV: 08/25/2021

## 2021-12-31 ENCOUNTER — Inpatient Hospital Stay
Admit: 2021-12-31 | Discharge: 2021-12-31 | Disposition: A | Discharge status: Home / Self Care | Payer: PRIVATE HEALTH INSURANCE | Attending: Emergency Medicine

## 2021-12-31 DIAGNOSIS — R109 Unspecified abdominal pain: Secondary | ICD-10-CM

## 2021-12-31 LAB — CBC WITH AUTO DIFFERENTIAL
Absolute Eos #: <0.03 10*3/uL (ref 0.00–0.44)
Absolute Immature Granulocyte: 0.01 10*3/uL (ref 0.00–0.30)
Absolute Lymph #: 1.93 10*3/uL (ref 1.10–3.70)
Absolute Mono #: 0.69 10*3/uL (ref 0.10–1.20)
Basophils Absolute: <0.03 10*3/uL (ref 0.00–0.20)
Basophils: 0 % (ref 0–2)
Eosinophils %: 0 % — ABNORMAL LOW (ref 1–4)
Hematocrit: 43.8 % (ref 40.7–50.3)
Hemoglobin: 14.5 g/dL (ref 13.0–17.0)
Immature Granulocytes: 0 %
Lymphocytes: 18 % — ABNORMAL LOW (ref 24–43)
MCH: 29.2 pg (ref 25.2–33.5)
MCHC: 33.1 g/dL (ref 28.4–34.8)
MCV: 88.1 fL (ref 82.6–102.9)
MPV: 10 fL (ref 8.1–13.5)
Monocytes: 6 % (ref 3–12)
NRBC Automated: 0 per 100 WBC
Platelets: 226 10*3/uL (ref 138–453)
RBC: 4.97 m/uL (ref 4.21–5.77)
RDW: 12.2 % (ref 11.8–14.4)
Seg Neutrophils: 76 % — ABNORMAL HIGH (ref 36–65)
Segs Absolute: 8.24 10*3/uL — ABNORMAL HIGH (ref 1.50–8.10)
WBC: 10.9 10*3/uL (ref 3.5–11.3)

## 2021-12-31 LAB — URINALYSIS WITH REFLEX TO CULTURE
Bilirubin Urine: NEGATIVE
Glucose, Ur: NEGATIVE
Leukocyte Esterase, Urine: NEGATIVE
Nitrite, Urine: NEGATIVE
Specific Gravity, UA: 1.01 (ref 1.005–1.030)
Urobilinogen, Urine: NORMAL
pH, UA: 6.5 (ref 5.0–8.0)

## 2021-12-31 LAB — COMPREHENSIVE METABOLIC PANEL W/ REFLEX TO MG FOR LOW K
ALT: 24 U/L (ref 5–41)
AST: 35 U/L (ref <40)
Albumin: 4.8 g/dL (ref 3.5–5.2)
Alkaline Phosphatase: 86 U/L (ref 40–129)
Anion Gap: 10 mmol/L (ref 9–17)
BUN: 11 mg/dL (ref 6–20)
Bun/Cre Ratio: 14 (ref 9–20)
CO2: 27 mmol/L (ref 20–31)
Calcium: 9.7 mg/dL (ref 8.6–10.4)
Chloride: 104 mmol/L (ref 98–107)
Creatinine: 0.79 mg/dL (ref 0.70–1.20)
Est, Glom Filt Rate: >60 mL/min/{1.73_m2} (ref >60)
Glucose: 117 mg/dL — ABNORMAL HIGH (ref 70–99)
Potassium: 4 mmol/L (ref 3.7–5.3)
Sodium: 141 mmol/L (ref 135–144)
Total Bilirubin: 0.7 mg/dL (ref 0.3–1.2)
Total Protein: 7.9 g/dL (ref 6.4–8.3)

## 2021-12-31 LAB — TROPONIN: Troponin, High Sensitivity: <6 ng/L (ref 0–22)

## 2021-12-31 LAB — MICROSCOPIC URINALYSIS: Epithelial Cells UA: 0 /HPF (ref 0–5)

## 2021-12-31 LAB — LIPASE: Lipase: 23 U/L (ref 13–60)

## 2021-12-31 MED ORDER — DIPHENHYDRAMINE HCL 50 MG/ML IJ SOLN
50 MG/ML | Freq: Once | INTRAMUSCULAR | Status: AC
Start: 2021-12-31 — End: 2021-12-31
  Administered 2021-12-31: 06:00:00 25 mg via INTRAVENOUS

## 2021-12-31 MED ORDER — KETOROLAC TROMETHAMINE 30 MG/ML IJ SOLN
30 MG/ML | Freq: Once | INTRAMUSCULAR | Status: AC
Start: 2021-12-31 — End: 2021-12-31
  Administered 2021-12-31: 06:00:00 30 mg via INTRAVENOUS

## 2021-12-31 MED ORDER — METOCLOPRAMIDE HCL 10 MG PO TABS
10 MG | ORAL_TABLET | Freq: Four times a day (QID) | ORAL | 0 refills | Status: DC | PRN
Start: 2021-12-31 — End: 2022-02-09

## 2021-12-31 MED ORDER — SODIUM CHLORIDE 0.9 % IV BOLUS
0.9 % | Freq: Once | INTRAVENOUS | Status: AC
Start: 2021-12-31 — End: 2021-12-31
  Administered 2021-12-31: 06:00:00 1000 mL via INTRAVENOUS

## 2021-12-31 MED ORDER — KETOROLAC TROMETHAMINE 10 MG PO TABS
10 MG | ORAL_TABLET | Freq: Four times a day (QID) | ORAL | 0 refills | Status: AC | PRN
Start: 2021-12-31 — End: 2022-01-21

## 2021-12-31 MED ORDER — METOCLOPRAMIDE HCL 5 MG/ML IJ SOLN
5 MG/ML | Freq: Once | INTRAMUSCULAR | Status: AC
Start: 2021-12-31 — End: 2021-12-31
  Administered 2021-12-31: 06:00:00 10 mg via INTRAVENOUS

## 2021-12-31 MED ORDER — LIDOCAINE 5 % EX PTCH
5 | MEDICATED_PATCH | Freq: Every day | CUTANEOUS | 0 refills | 20.00000 days | Status: DC
Start: 2021-12-31 — End: 2024-04-05

## 2021-12-31 MED ORDER — CYCLOBENZAPRINE HCL 10 MG PO TABS
10 MG | ORAL_TABLET | Freq: Three times a day (TID) | ORAL | 0 refills | Status: AC | PRN
Start: 2021-12-31 — End: 2022-01-10

## 2021-12-31 MED ORDER — SODIUM CHLORIDE 0.9 % IV BOLUS
0.9 % | Freq: Once | INTRAVENOUS | Status: DC
Start: 2021-12-31 — End: 2021-12-31

## 2021-12-31 MED FILL — DIPHENHYDRAMINE HCL 50 MG/ML IJ SOLN: 50 MG/ML | INTRAMUSCULAR | Qty: 1

## 2021-12-31 MED FILL — KETOROLAC TROMETHAMINE 30 MG/ML IJ SOLN: 30 MG/ML | INTRAMUSCULAR | Qty: 1

## 2021-12-31 MED FILL — METOCLOPRAMIDE HCL 5 MG/ML IJ SOLN: 5 MG/ML | INTRAMUSCULAR | Qty: 2

## 2021-12-31 NOTE — Discharge Instructions (Signed)
If given narcotics (opiates) during this Emergency Department visit, please do not drink, drive or operate any machinery for at least 4 - 6 hours.    Avoid eating any spicy food, milk type products or drinks that have caffeine in it.  Take all medications as prescribed.  For pain use ibuprofen (Motrin) or acetaminophen (Tylenol), unless prescribed medications that have acetaminophen in it.  You can take over the counter acetaminophen tablets (1 - 2 tablets of the 500-mg strength every 6 hours) or ibuprofen tablets (2 tablets every 4 hours).    PLEASE RETURN TO THE EMERGENCY DEPARTMENT IMMEDIATELY for worsening symptoms, or if you develop any concerning symptoms such as: high fever not relieved by acetaminophen (Tylenol) and/or ibuprofen (Motrin), chills, shortness of breath, chest pain, persistent nausea and/or vomiting, numbness, weakness or tingling in the arms or legs or change in color of the extremities, changes in mental status, persistent headache, blurry vision.    Return within 8 - 12 hours if you have any of the following: worsening of pain in your abdomen, no food sounds good to you, you continue to vomit, pain goes to your back or testicles, have pain in the abdomen when going over a bump in the car or when you jump up and down, inability to urinate, unable to follow up with your physician, or other any other care or concern.

## 2021-12-31 NOTE — ED Notes (Signed)
Pt arrived to ed with c/o multiple episodes of emesis and left sided flank pain. Pt states left sided flank pain for multiple days. Pt states emesis started last night. Emesis clear in presentation. Pt denies hematuria. Pt denies hx of kidney stones. Pt states hx of SBO. Pt states pain and discomfort does NOT feel the same as when he has the SBO. Respirations non labored. Pt A&Ox4. Pt appears to be very anxious at rest.      Elaina Pattee, RN  12/31/21 [phone]
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
## 2021-12-31 NOTE — ED Notes (Signed)
When going to flush pt IV and start another Liter of fluids, pt states he is ready to go and wants to be checked out. Pt was informed that we are still waiting on some results. Pt insists he wants to leave. Dr. Redgie Grayer notified.      Elaina Pattee, RN  12/31/21 [phone]

## 2022-01-20 ENCOUNTER — Encounter

## 2022-01-21 ENCOUNTER — Emergency Department: Admit: 2022-01-21 | Payer: PRIVATE HEALTH INSURANCE | Primary: Internal Medicine

## 2022-01-21 ENCOUNTER — Inpatient Hospital Stay
Admit: 2022-01-21 | Discharge: 2022-01-21 | Disposition: A | Discharge status: Home / Self Care | Payer: PRIVATE HEALTH INSURANCE | Attending: Emergency Medicine

## 2022-01-21 ENCOUNTER — Emergency Department: Payer: PRIVATE HEALTH INSURANCE | Primary: Internal Medicine

## 2022-01-21 DIAGNOSIS — M62830 Muscle spasm of back: Secondary | ICD-10-CM

## 2022-01-21 DIAGNOSIS — R109 Unspecified abdominal pain: Secondary | ICD-10-CM

## 2022-01-21 LAB — CBC WITH AUTO DIFFERENTIAL
Absolute Eos #: <0.03 10*3/uL (ref 0.00–0.44)
Absolute Immature Granulocyte: 0.02 10*3/uL (ref 0.00–0.30)
Absolute Lymph #: 1.72 10*3/uL (ref 1.10–3.70)
Absolute Mono #: 0.64 10*3/uL (ref 0.10–1.20)
Basophils Absolute: 0.03 10*3/uL (ref 0.00–0.20)
Basophils: 0 % (ref 0–2)
Eosinophils %: 0 % — ABNORMAL LOW (ref 1–4)
Hematocrit: 45.4 % (ref 40.7–50.3)
Hemoglobin: 15 g/dL (ref 13.0–17.0)
Immature Granulocytes: 0 %
Lymphocytes: 20 % — ABNORMAL LOW (ref 24–43)
MCH: 29.5 pg (ref 25.2–33.5)
MCHC: 33 g/dL (ref 28.4–34.8)
MCV: 89.4 fL (ref 82.6–102.9)
MPV: 9.9 fL (ref 8.1–13.5)
Monocytes: 7 % (ref 3–12)
NRBC Automated: 0 per 100 WBC
Platelets: 246 10*3/uL (ref 138–453)
RBC: 5.08 m/uL (ref 4.21–5.77)
RDW: 12.3 % (ref 11.8–14.4)
Seg Neutrophils: 73 % — ABNORMAL HIGH (ref 36–65)
Segs Absolute: 6.41 10*3/uL (ref 1.50–8.10)
WBC: 8.8 10*3/uL (ref 3.5–11.3)

## 2022-01-21 LAB — LIPASE: Lipase: 21 U/L (ref 13–60)

## 2022-01-21 LAB — BASIC METABOLIC PANEL
Anion Gap: 15 mmol/L (ref 9–17)
BUN: 14 mg/dL (ref 6–20)
Bun/Cre Ratio: 19 (ref 9–20)
CO2: 24 mmol/L (ref 20–31)
Calcium: 9.7 mg/dL (ref 8.6–10.4)
Chloride: 101 mmol/L (ref 98–107)
Creatinine: 0.74 mg/dL (ref 0.70–1.20)
Est, Glom Filt Rate: >60 mL/min/{1.73_m2} (ref >60)
Glucose: 108 mg/dL — ABNORMAL HIGH (ref 70–99)
Potassium: 4.2 mmol/L (ref 3.7–5.3)
Sodium: 140 mmol/L (ref 135–144)

## 2022-01-21 LAB — HEPATIC FUNCTION PANEL
ALT: 64 U/L — ABNORMAL HIGH (ref 5–41)
AST: 39 U/L (ref <40)
Albumin: 5.1 g/dL (ref 3.5–5.2)
Alkaline Phosphatase: 82 U/L (ref 40–129)
Bilirubin, Direct: 0.2 mg/dL (ref <0.3)
Bilirubin, Indirect: 0.5 mg/dL (ref 0.0–1.0)
Total Bilirubin: 0.7 mg/dL (ref 0.3–1.2)
Total Protein: 8.1 g/dL (ref 6.4–8.3)

## 2022-01-21 LAB — MAGNESIUM: Magnesium: 2 mg/dL (ref 1.6–2.6)

## 2022-01-21 LAB — TROPONIN
Troponin, High Sensitivity: <6 ng/L (ref 0–22)
Troponin, High Sensitivity: 7 ng/L (ref 0–22)

## 2022-01-21 MED ORDER — KETOROLAC TROMETHAMINE 30 MG/ML IJ SOLN
30 MG/ML | Freq: Once | INTRAMUSCULAR | Status: AC
Start: 2022-01-21 — End: 2022-01-21
  Administered 2022-01-21: 15:00:00 30 mg via INTRAVENOUS

## 2022-01-21 MED ORDER — PREDNISONE 50 MG PO TABS
50 MG | ORAL_TABLET | Freq: Every day | ORAL | 0 refills | Status: AC
Start: 2022-01-21 — End: 2022-01-26

## 2022-01-21 MED ORDER — CYCLOBENZAPRINE HCL 10 MG PO TABS
10 MG | ORAL_TABLET | Freq: Three times a day (TID) | ORAL | 0 refills | Status: AC | PRN
Start: 2022-01-21 — End: 2022-01-31

## 2022-01-21 MED ORDER — PROMETHAZINE HCL 25 MG PO TABS
25 MG | Freq: Once | ORAL | Status: AC
Start: 2022-01-21 — End: 2022-01-21
  Administered 2022-01-21: 16:00:00 25 mg via ORAL

## 2022-01-21 MED ORDER — KETOROLAC TROMETHAMINE 10 MG PO TABS
10 | ORAL_TABLET | Freq: Four times a day (QID) | ORAL | 0 refills | 4.50000 days | Status: DC | PRN
Start: 2022-01-21 — End: 2024-04-05

## 2022-01-21 MED ORDER — LORAZEPAM 2 MG/ML IJ SOLN
2 MG/ML | Freq: Once | INTRAMUSCULAR | Status: AC
Start: 2022-01-21 — End: 2022-01-21
  Administered 2022-01-21: 15:00:00 1 mg via INTRAVENOUS

## 2022-01-21 MED ORDER — SODIUM CHLORIDE 0.9 % IV BOLUS
0.9 % | Freq: Once | INTRAVENOUS | Status: AC
Start: 2022-01-21 — End: 2022-01-21
  Administered 2022-01-21: 15:00:00 1000 mL via INTRAVENOUS

## 2022-01-21 MED ORDER — LORAZEPAM 1 MG PO TABS
1 MG | ORAL_TABLET | Freq: Three times a day (TID) | ORAL | 0 refills | Status: AC | PRN
Start: 2022-01-21 — End: 2022-02-20

## 2022-01-21 MED FILL — PROMETHAZINE HCL 25 MG PO TABS: 25 MG | ORAL | Qty: 1

## 2022-01-21 MED FILL — KETOROLAC TROMETHAMINE 30 MG/ML IJ SOLN: 30 MG/ML | INTRAMUSCULAR | Qty: 1

## 2022-01-21 MED FILL — LORAZEPAM 2 MG/ML IJ SOLN: 2 MG/ML | INTRAMUSCULAR | Qty: 1

## 2022-01-21 NOTE — Telephone Encounter (Signed)
Last OV: 08/25/2021
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
## 2022-01-21 NOTE — ED Notes (Signed)
Unable to obtain EKG due to patient movement. Will try once patient is calm      Cheron Every, RN  01/21/22 1030

## 2022-01-22 LAB — EKG 12-LEAD
Atrial Rate: 86 {beats}/min
P Axis: 63 degrees
P-R Interval: 146 ms
Q-T Interval: 388 ms
QRS Duration: 92 ms
QTc Calculation (Bazett): 464 ms
R Axis: 49 degrees
T Axis: 64 degrees
Ventricular Rate: 86 {beats}/min

## 2022-01-24 ENCOUNTER — Encounter

## 2022-01-25 MED ORDER — GABAPENTIN 600 MG PO TABS
600 MG | ORAL_TABLET | ORAL | 0 refills | Status: DC
Start: 2022-01-25 — End: 2022-10-07

## 2022-01-25 NOTE — Telephone Encounter (Signed)
Schedule patient for follow-up

## 2022-01-25 NOTE — Telephone Encounter (Signed)
Called pt, awaiting call back

## 2022-01-25 NOTE — Telephone Encounter (Signed)
LOV 08/25/21

## 2022-02-09 ENCOUNTER — Ambulatory Visit
Admit: 2022-02-09 | Discharge: 2022-02-09 | Payer: PRIVATE HEALTH INSURANCE | Attending: Internal Medicine | Primary: Internal Medicine

## 2022-02-09 DIAGNOSIS — Z131 Encounter for screening for diabetes mellitus: Secondary | ICD-10-CM

## 2022-02-09 MED ORDER — BLOOD PRESSURE KIT
PACK | 0 refills | Status: AC
Start: 2022-02-09 — End: ?

## 2022-02-09 MED ORDER — AMLODIPINE BESYLATE 2.5 MG PO TABS
2.5 MG | ORAL_TABLET | Freq: Every day | ORAL | 1 refills | Status: DC
Start: 2022-02-09 — End: 2022-02-09

## 2022-02-09 NOTE — Progress Notes (Signed)
Visit Information    Have you changed or started any medications since your last visit including any over-the-counter medicines, vitamins, or herbal medicines? no   Are you having any side effects from any of your medications? -  no  Have you stopped taking any of your medications? Is so, why? -  no    Have you seen any other physician or provider since your last visit? No  Have you had any other diagnostic tests since your last visit? No  Have you been seen in the emergency room and/or had an admission to a hospital since we last saw you? No  Have you had your routine dental cleaning in the past 6 months? no    Have you activated your MyChart account? If not, what are your barriers? Yes     Patient Care Team:  Georgana Curio, MD as PCP - General (Internal Medicine)  Curt Bears, APRN - CNP as PCP - Empaneled Provider  [facility]  Corky Mull, MD as Consulting Physician (Gastroenterology)    Medical History Review  Past Medical, Family, and Social History reviewed and does not contribute to the patient presenting condition    Health Maintenance   Topic Date Due    COVID-19 Vaccine (1) Never done    Varicella vaccine (1 of 2 - 2-dose childhood series) Never done    Diabetes screen  Never done    Flu vaccine (1) 02/16/2022    Depression Monitoring  08/25/2022    DTaP/Tdap/Td vaccine (2 - Td or Tdap) 07/09/2029    Hepatitis C screen  Completed    HIV screen  Completed    Hepatitis A vaccine  Aged Out    Hib vaccine  Aged Out    Meningococcal (ACWY) vaccine  Aged Out    Pneumococcal 0-64 years Vaccine  Aged Out    Depression Screen  Discontinued
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
## 2022-02-10 NOTE — Telephone Encounter (Signed)
Patient wife called informing that David [name] had an office visit yesterday and had been under the impression that he would be prescribed Rx for nausea symptoms.      [employer] / Georgeanne Nim

## 2022-02-11 NOTE — Telephone Encounter (Signed)
Contacted patient, he informed that promethazine works for him.

## 2022-02-12 MED ORDER — PROMETHAZINE HCL 25 MG PO TABS
25 MG | ORAL_TABLET | Freq: Three times a day (TID) | ORAL | 0 refills | Status: AC | PRN
Start: 2022-02-12 — End: 2022-02-19

## 2022-02-12 NOTE — Addendum Note (Signed)
Addended by: Georgana Curio on: 02/12/2022 03:48 PM     Modules accepted: Orders

## 2022-03-26 ENCOUNTER — Encounter: Payer: PRIVATE HEALTH INSURANCE | Attending: Neurology | Primary: Internal Medicine

## 2022-03-26 NOTE — Progress Notes (Unsigned)
NEUROLOGY CONSULT  Patient Name:       [name]  DOB:        07/10/1986  Clinic Visit Date:    03/26/2022        Dear Dr. Georgana Curio, MD     I had the opportunity to see your patient, Mr. David [name] in neurology consultation today. As you know he  is a He is a 36 y.o.  male with c/o "trouble with short-term memory due to head injury last year".  As per medical record; fell from ladder last year, hit head, no LOC, was disoriented; never went to ER.  Patient was on Ativan and was asking PCP to increase Ativan dose. Hx of opioid abuse, was on methadone.

## 2022-09-13 ENCOUNTER — Emergency Department: Admit: 2022-09-13 | Payer: PRIVATE HEALTH INSURANCE | Primary: Internal Medicine

## 2022-09-13 ENCOUNTER — Inpatient Hospital Stay
Admit: 2022-09-13 | Discharge: 2022-09-14 | Disposition: A | Discharge status: Home / Self Care | Payer: PRIVATE HEALTH INSURANCE | Attending: Emergency Medicine

## 2022-09-13 DIAGNOSIS — R112 Nausea with vomiting, unspecified: Secondary | ICD-10-CM

## 2022-09-13 LAB — CBC WITH AUTO DIFFERENTIAL
Basophils %: 0 % (ref 0–2)
Basophils Absolute: 0 10*3/uL (ref 0.0–0.2)
Eosinophils %: 0 % (ref 0–4)
Eosinophils Absolute: 0 10*3/uL (ref 0.0–0.4)
Hematocrit: 45.2 % (ref 41–53)
Hemoglobin: 15.1 g/dL (ref 13.5–17.5)
Lymphocytes %: 10 % — ABNORMAL LOW (ref 24–44)
Lymphocytes Absolute: 1.3 10*3/uL (ref 1.0–4.8)
MCH: 29.7 pg (ref 26–34)
MCHC: 33.3 g/dL (ref 31–37)
MCV: 89.3 fL (ref 80–100)
MPV: 8 fL (ref 6.0–12.0)
Monocytes %: 6 % (ref 1–7)
Monocytes Absolute: 0.8 10*3/uL (ref 0.1–1.3)
Neutrophils %: 84 % — ABNORMAL HIGH (ref 36–66)
Neutrophils Absolute: 11.3 10*3/uL — ABNORMAL HIGH (ref 1.3–9.1)
Platelets: 285 10*3/uL (ref 150–450)
RBC: 5.06 m/uL (ref 4.5–5.9)
RDW: 13.1 % (ref 11.5–14.9)
WBC: 13.4 10*3/uL — ABNORMAL HIGH (ref 3.5–11.0)

## 2022-09-13 LAB — COMPREHENSIVE METABOLIC PANEL
ALT: 21 U/L (ref 5–41)
AST: 26 U/L (ref <40)
Albumin: 5 g/dL (ref 3.5–5.2)
Alkaline Phosphatase: 77 U/L (ref 40–129)
Anion Gap: 19 mmol/L — ABNORMAL HIGH (ref 9–17)
BUN: 14 mg/dL (ref 6–20)
CO2: 20 mmol/L (ref 20–31)
Calcium: 10.1 mg/dL (ref 8.6–10.4)
Chloride: 101 mmol/L (ref 98–107)
Creatinine: 1 mg/dL (ref 0.7–1.2)
Est, Glom Filt Rate: >60 mL/min/{1.73_m2} (ref >60)
Glucose: 89 mg/dL (ref 70–99)
Potassium: 3.9 mmol/L (ref 3.7–5.3)
Sodium: 140 mmol/L (ref 135–144)
Total Bilirubin: 0.4 mg/dL (ref 0.3–1.2)
Total Protein: 8.5 g/dL — ABNORMAL HIGH (ref 6.4–8.3)

## 2022-09-13 LAB — COVID-19 & INFLUENZA COMBO
INFLUENZA A: NOT DETECTED
INFLUENZA B: NOT DETECTED
SARS-CoV-2 RNA, RT PCR: NOT DETECTED

## 2022-09-13 LAB — LACTIC ACID: Lactic Acid: 1.6 mmol/L (ref 0.5–2.2)

## 2022-09-13 LAB — LIPASE: Lipase: 21 U/L (ref 13–60)

## 2022-09-13 MED ORDER — SODIUM CHLORIDE 0.9 % IV BOLUS
0.9 | Freq: Once | INTRAVENOUS | Status: AC
Start: 2022-09-13 — End: 2022-09-13
  Administered 2022-09-13: 23:00:00 100 mL via INTRAVENOUS

## 2022-09-13 MED ORDER — NORMAL SALINE FLUSH 0.9 % IV SOLN
0.9 | INTRAVENOUS | Status: DC | PRN
Start: 2022-09-13 — End: 2022-09-13
  Administered 2022-09-13: 23:00:00 10 mL via INTRAVENOUS

## 2022-09-13 MED ORDER — IOPAMIDOL 76 % IV SOLN
76 | Freq: Once | INTRAVENOUS | Status: AC | PRN
Start: 2022-09-13 — End: 2022-09-13
  Administered 2022-09-13: 23:00:00 75 mL via INTRAVENOUS

## 2022-09-13 MED ORDER — DROPERIDOL 2.5 MG/ML IJ SOLN
2.5 | Freq: Once | INTRAMUSCULAR | Status: AC
Start: 2022-09-13 — End: 2022-09-13
  Administered 2022-09-13: 22:00:00 1.25 mg via INTRAVENOUS

## 2022-09-13 MED ORDER — SODIUM CHLORIDE 0.9 % IV BOLUS
0.9 | Freq: Once | INTRAVENOUS | Status: AC
Start: 2022-09-13 — End: 2022-09-13
  Administered 2022-09-13: 22:00:00 1000 mL via INTRAVENOUS

## 2022-09-13 MED FILL — DROPERIDOL 2.5 MG/ML IJ SOLN: 2.5 MG/ML | INTRAMUSCULAR | Qty: 2

## 2022-09-13 NOTE — ED Triage Notes (Signed)
Mode of arrival (squad #, walk in, police, etc) : walk-in        Chief complaint(s): headache, abdominal pain, nausea and vomiting        Arrival Note (brief scenario, treatment PTA, etc).: patient states LUQ abdominal pain, N/V and headache since yesterday        C= "Have you ever felt that you should Cut down on your drinking?"  No  A= "Have people Annoyed you by criticizing your drinking?"  No  G= "Have you ever felt bad or Guilty about your drinking?"  No  E= "Have you ever had a drink as an Eye-opener first thing in the morning to steady your nerves or to help a hangover?"  No      Deferred []$       Reason for deferring: N/A    *If yes to two or more: probable alcohol abuse.*

## 2022-09-13 NOTE — ED Notes (Signed)
Discharge instructions discussed including diet recommendation. Pt and family verbalized understanding.
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
## 2022-09-14 LAB — EKG 12-LEAD
Atrial Rate: 92 {beats}/min
P Axis: 78 degrees
P-R Interval: 144 ms
Q-T Interval: 370 ms
QRS Duration: 90 ms
QTc Calculation (Bazett): 457 ms
R Axis: 46 degrees
T Axis: 52 degrees
Ventricular Rate: 92 {beats}/min

## 2022-09-14 MED ORDER — PROMETHAZINE HCL 25 MG PO TABS
25 | ORAL_TABLET | Freq: Four times a day (QID) | ORAL | 0 refills | Status: DC | PRN
Start: 2022-09-14 — End: 2022-09-13

## 2022-09-14 MED ORDER — PROMETHAZINE HCL 25 MG/ML IJ SOLN
25 | Freq: Once | INTRAMUSCULAR | Status: AC
Start: 2022-09-14 — End: 2022-09-13
  Administered 2022-09-14: 01:00:00 25 mg via INTRAMUSCULAR

## 2022-09-14 MED ORDER — PROMETHAZINE HCL 25 MG PO TABS
25 | ORAL_TABLET | Freq: Four times a day (QID) | ORAL | 0 refills | Status: AC | PRN
Start: 2022-09-14 — End: 2022-09-20

## 2022-09-14 MED FILL — PROMETHAZINE HCL 25 MG/ML IJ SOLN: 25 MG/ML | INTRAMUSCULAR | Qty: 1

## 2022-10-07 ENCOUNTER — Encounter

## 2022-10-07 MED ORDER — GABAPENTIN 600 MG PO TABS
600 MG | ORAL_TABLET | Freq: Three times a day (TID) | ORAL | 0 refills | Status: AC
Start: 2022-10-07 — End: 2022-11-06

## 2022-10-07 NOTE — Telephone Encounter (Signed)
Calling in request for gabapentin refill    Last office visit 02/09/2022

## 2022-10-19 ENCOUNTER — Encounter: Payer: PRIVATE HEALTH INSURANCE | Attending: Internal Medicine | Primary: Internal Medicine

## 2022-10-20 NOTE — Telephone Encounter (Signed)
Mailed patient no show letter date of 10/19/22 scheduled with Dr Earnestine Mealing

## 2022-11-07 ENCOUNTER — Encounter

## 2022-11-08 MED ORDER — GABAPENTIN 600 MG PO TABS
600 MG | ORAL_TABLET | Freq: Three times a day (TID) | ORAL | 0 refills | Status: DC
Start: 2022-11-08 — End: 2022-12-06

## 2022-11-08 NOTE — Telephone Encounter (Signed)
Patient called and scheduled an appointment. First available is 11/26/2022. Patient is out of medication. Please advise.

## 2022-11-17 NOTE — Progress Notes (Signed)
error 

## 2022-11-26 ENCOUNTER — Encounter
Admit: 2022-11-26 | Discharge: 2022-11-26 | Payer: PRIVATE HEALTH INSURANCE | Attending: Internal Medicine | Primary: Internal Medicine

## 2022-11-26 DIAGNOSIS — M545 Low back pain, unspecified: Secondary | ICD-10-CM

## 2022-11-26 MED ORDER — CYCLOBENZAPRINE HCL 5 MG PO TABS
5 | ORAL_TABLET | Freq: Two times a day (BID) | ORAL | 0 refills | Status: AC | PRN
Start: 2022-11-26 — End: 2022-12-06
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
## 2022-11-26 NOTE — Progress Notes (Signed)
Visit Information    Have you changed or started any medications since your last visit including any over-the-counter medicines, vitamins, or herbal medicines? no   Are you having any side effects from any of your medications? -  no  Have you stopped taking any of your medications? Is so, why? -  no    Have you seen any other physician or provider since your last visit? No  Have you had any other diagnostic tests since your last visit? No  Have you been seen in the emergency room and/or had an admission to a hospital since we last saw you? No  Have you had your routine dental cleaning in the past 6 months? no    Have you activated your MyChart account? If not, what are your barriers? Yes     Patient Care Team:  Georgana Curio, MD as PCP - General (Internal Medicine)  Georgana Curio, MD as PCP - [facility] [place]  Corky Mull, MD as Consulting Physician (Gastroenterology)    Medical History Review  Past Medical, Family, and Social History reviewed and does not contribute to the patient presenting condition    Health Maintenance   Topic Date Due    Hepatitis B vaccine (1 of 3 - 3-dose series) Never done    COVID-19 Vaccine (1) Never done    Varicella vaccine (1 of 2 - 2-dose childhood series) Never done    Depression Monitoring  08/25/2022    Flu vaccine (Season Ended) 02/17/2023    DTaP/Tdap/Td vaccine (2 - Td or Tdap) 07/09/2029    Hepatitis C screen  Completed    HIV screen  Completed    Hepatitis A vaccine  Aged Out    Hib vaccine  Aged Out    HPV vaccine  Aged Out    Polio vaccine  Aged Out    Meningococcal (ACWY) vaccine  Aged Out    Pneumococcal 0-64 years Vaccine  Aged Out    Depression Screen  Discontinued

## 2022-12-04 ENCOUNTER — Encounter

## 2022-12-06 MED ORDER — GABAPENTIN 600 MG PO TABS
600 MG | ORAL_TABLET | Freq: Three times a day (TID) | ORAL | 0 refills | Status: AC
Start: 2022-12-06 — End: 2023-01-05

## 2023-01-03 ENCOUNTER — Encounter

## 2023-01-04 MED ORDER — GABAPENTIN 600 MG PO TABS
600 | ORAL_TABLET | Freq: Three times a day (TID) | ORAL | 2 refills | Status: AC
Start: 2023-01-04 — End: 2023-04-04

## 2023-03-04 ENCOUNTER — Encounter: Payer: PRIVATE HEALTH INSURANCE | Attending: Internal Medicine | Primary: Internal Medicine

## 2023-03-07 ENCOUNTER — Encounter: Payer: PRIVATE HEALTH INSURANCE | Attending: Internal Medicine | Primary: Internal Medicine

## 2023-03-07 NOTE — Telephone Encounter (Signed)
#  2 No Show ([place] Header)

## 2023-03-14 NOTE — Progress Notes (Signed)
No show

## 2023-04-04 ENCOUNTER — Encounter

## 2023-04-04 MED ORDER — GABAPENTIN 600 MG PO TABS
600 | ORAL_TABLET | Freq: Three times a day (TID) | ORAL | 2 refills | Status: DC
Start: 2023-04-04 — End: 2023-07-01

## 2023-06-06 ENCOUNTER — Encounter: Admit: 2023-06-06 | Admitting: Internal Medicine

## 2023-06-06 DIAGNOSIS — G8929 Other chronic pain: Principal | ICD-10-CM

## 2023-06-06 DIAGNOSIS — M545 Low back pain, unspecified: Secondary | ICD-10-CM

## 2023-07-01 ENCOUNTER — Encounter

## 2023-07-01 NOTE — Telephone Encounter (Signed)
 Medication Requested: Gabapentin    Last visit: 11/26/22  Next visit: 08/10/2023  Last refill: 9//16/24    Med contract on file:  []  yes   []  no    Last urine drug screen: None with this clinic last one was 10/03/2021  Consistent with medication(s):    []  yes

## 2023-07-04 ENCOUNTER — Encounter: Admit: 2023-07-04 | Admitting: Internal Medicine

## 2023-07-04 DIAGNOSIS — G8929 Other chronic pain: Principal | ICD-10-CM

## 2023-07-04 DIAGNOSIS — M545 Low back pain, unspecified: Secondary | ICD-10-CM

## 2023-07-04 MED ORDER — GABAPENTIN 600 MG PO TABS
600 | ORAL_TABLET | Freq: Three times a day (TID) | ORAL | 2 refills | Status: AC
Start: 2023-07-04 — End: 2023-10-02

## 2023-07-04 NOTE — Telephone Encounter (Signed)
 Patient's wife called checking on status of this being sent to pharmacy. Patient is now completely out of medication.    Please advise

## 2023-08-10 ENCOUNTER — Encounter: Payer: PRIVATE HEALTH INSURANCE | Attending: Internal Medicine | Primary: Internal Medicine

## 2023-08-10 NOTE — Telephone Encounter (Signed)
Mailed no show letter #1

## 2023-09-05 ENCOUNTER — Encounter: Attending: Internal Medicine | Primary: Internal Medicine

## 2023-09-06 NOTE — Telephone Encounter (Signed)
 Mailed no show letter (2).

## 2023-09-26 NOTE — Progress Notes (Signed)
 No show

## 2023-09-27 ENCOUNTER — Encounter

## 2023-09-27 MED ORDER — GABAPENTIN 600 MG PO TABS
600 MG | ORAL_TABLET | Freq: Three times a day (TID) | ORAL | 2 refills | Status: DC
Start: 2023-09-27 — End: 2023-12-26

## 2023-11-21 NOTE — Telephone Encounter (Signed)
 LV 11/2022  NV 02/2024 first available.    Patient states he has been working a lot of extra hours  and he thinks his Gabapentin  is making him tired. Patient states it does help a lot but it is making him drowsy. He is wondering if the medication can be decreased to 400 mg.    He will need a new script called in.    [facility]    Please advise.

## 2023-11-22 NOTE — Telephone Encounter (Signed)
 Spoke with patient, voiced understanding, schedule appointment with Dr. Hubert Madden this week

## 2023-11-24 ENCOUNTER — Encounter: Payer: Medicaid (Managed Care) | Attending: Internal Medicine | Primary: Internal Medicine

## 2023-12-20 ENCOUNTER — Encounter: Payer: Self-pay | Admitting: Family Medicine

## 2023-12-20 ENCOUNTER — Ambulatory Visit (INDEPENDENT_AMBULATORY_CARE_PROVIDER_SITE_OTHER): Admitting: Family Medicine

## 2023-12-20 VITALS — BP 110/62 | HR 73 | Temp 97.8°F | Resp 16 | Ht 71.0 in | Wt 181.0 lb

## 2023-12-20 DIAGNOSIS — R634 Abnormal weight loss: Secondary | ICD-10-CM | POA: Insufficient documentation

## 2023-12-20 DIAGNOSIS — W57XXXA Bitten or stung by nonvenomous insect and other nonvenomous arthropods, initial encounter: Secondary | ICD-10-CM | POA: Diagnosis not present

## 2023-12-20 DIAGNOSIS — R197 Diarrhea, unspecified: Secondary | ICD-10-CM

## 2023-12-20 NOTE — Progress Notes (Signed)
 New Patient Office Visit  Subjective    Patient ID: Harold Moyer, male    DOB: [birth year]  Age: 38 y.o. MRN: [id number]  CC:  Chief Complaint  Patient presents with   Establish Care   Abdominal Pain   Diarrhea    Discussed the use of AI scribe software for clinical note transcription with the patient, who gave verbal consent to proceed.  History of Present Illness   Harold Moyer is a 38 year old male who presents with persistent diarrhea and abdominal issues.  He has been experiencing persistent diarrhea and abdominal issues for the past two weeks, beginning on Dec 08, 2023, two days after consuming food from [employer]. Initially, he experienced severe diarrhea, with bowel movements every 30 minutes, causing him to miss work due to the frequency and urgency.  Currently, his symptoms fluctuate, with some days being better than others. On the day of the visit, he had diarrhea approximately ten times. He describes the sensation as 'stomach gas pains' and 'crampy,' with varying amounts of stool each time. No fever has been present throughout the illness, but he experienced extreme loss of energy initially, which has since improved.  He has tried over-the-counter medications like Imodium and Pepto-Bismol without relief. He reports a significant weight loss of 13.5 pounds within the first few days of symptom onset. He denies any blood in his stool but mentions that it sometimes appears mucousy.  His diet includes a lot of red meat and deer meat, and he often forgets to eat when busy, which can exacerbate his symptoms. He has no known food allergies and no family history of irritable bowel syndrome, Crohn's disease, or diverticulitis.  He has a history of being bitten by ticks and is frequently in environments where ticks are present.  He has experienced significant emotional stress recently, including the anniversary of his daughter's death and a family tragedy, which he acknowledges  could be related to his current symptoms.      No outpatient encounter medications on file as of 12/20/2023.   No facility-administered encounter medications on file as of 12/20/2023.    History reviewed. No pertinent past medical history.  History reviewed. No pertinent surgical history.  Family History  Problem Relation Age of Onset   Atrial fibrillation Mother    Hernia Mother    GER disease Mother    Sleep apnea Mother    Diabetes Father    Sleep apnea Brother    Spinal muscular atrophy Daughter    Alzheimer's disease Paternal Grandmother     Social History   Socioeconomic History   Marital status: Married    Spouse name: Not on file   Number of children: Not on file   Years of education: Not on file   Highest education level: Not on file  Occupational History   Not on file  Tobacco Use   Smoking status: Former    Current packs/day: 0.00    Types: Cigarettes    Quit date: 2020    Years since quitting: 5.4   Smokeless tobacco: Never  Vaping Use   Vaping status: Never Used  Substance and Sexual Activity   Alcohol use: Yes    Comment: occasionally   Drug use: No   Sexual activity: Yes  Other Topics Concern   Not on file  Social History Narrative   Not on file   Social Drivers of Health   Financial Resource Strain: Not on file  Food Insecurity: Not on  file  Transportation Needs: Not on file  Physical Activity: Not on file  Stress: Not on file  Social Connections: Unknown (11/29/2021)   Received from [facility]   Social Network    Social Network: Not on file  Intimate Partner Violence: Unknown (10/22/2021)   Received from [facility]   HITS    Physically Hurt: Not on file    Insult or Talk Down To: Not on file    Threaten Physical Harm: Not on file    Scream or Curse: Not on file    Review of Systems  Constitutional:  Positive for chills (2 weeks ago), malaise/fatigue and weight loss. Negative for fever.  HENT:  Negative for congestion, ear  pain, sinus pain and sore throat.   Eyes: Negative.   Respiratory:  Negative for cough, shortness of breath and wheezing.   Cardiovascular:  Negative for chest pain, palpitations and leg swelling.  Gastrointestinal:  Positive for abdominal pain and diarrhea. Negative for blood in stool, constipation, nausea and vomiting.  Genitourinary:  Negative for dysuria, frequency and urgency.  Musculoskeletal: Negative.   Skin: Negative.   Neurological:  Negative for dizziness and headaches.  Psychiatric/Behavioral: Negative.         Recent loss         Objective    BP 110/62   Pulse 73   Temp 97.8 F (36.6 C) (Temporal)   Resp 16   Ht 5\' 11"  (1.803 m)   Wt 181 lb (82.1 kg)   SpO2 98%   BMI 25.24 kg/m   Physical Exam Vitals reviewed.  Constitutional:      General: He is not in acute distress.    Appearance: Normal appearance. He is not ill-appearing.  Neck:     Vascular: No carotid bruit.  Cardiovascular:     Rate and Rhythm: Normal rate and regular rhythm.     Heart sounds: Normal heart sounds. No murmur heard. Pulmonary:     Effort: Pulmonary effort is normal.     Breath sounds: Normal breath sounds. No wheezing.  Abdominal:     General: Abdomen is flat. Bowel sounds are normal.     Palpations: Abdomen is soft.     Tenderness: There is no abdominal tenderness.  Skin:    General: Skin is warm.  Neurological:     Mental Status: He is alert. Mental status is at baseline.  Psychiatric:        Mood and Affect: Mood normal.        Behavior: Behavior normal.         Assessment & Plan:   Problem List Items Addressed This Visit       Musculoskeletal and Integument   Tick bite   Recent tick bite - Order alpha-gal panel. Await labs/testing for assessment and recommendations       Relevant Orders   Alpha-Gal Panel     Other   Diarrhea - Primary   Acute Intermittent diarrhea for two weeks, initially severe. Differential includes infectious gastroenteritis,  inflammatory bowel disease, stress-related symptoms, and alpha-gal syndrome. Recent stressors noted. No neurological symptoms to suggest Guillain-Barre syndrome. Emphasized identifying infectious agent before antibiotics. - Order stool studies for infectious causes. - Order CBC and CMP for infection, liver/kidney function, and electrolytes. - Order alpha-gal panel. - Refer to gastroenterologist if stool studies inconclusive. - Consider abdominal CT  - Discuss cognitive behavioral therapy or medication  management if stress-related.      Relevant Orders   CBC with Differential  Comprehensive metabolic panel with GFR   GI Profile, Stool, PCR   Abnormal weight loss   Weight loss of 13.5 pounds during severe diarrhea episode, likely due to fluid loss and decreased intake. No dehydration signs, but inadequate hydration noted. - Monitor weight and nutritional intake. - Encourage adequate hydration and balanced diet.          Follow-up:Return if symptoms worsen or fail to improve.   Delford Felling, FNP [facility] [phone]

## 2023-12-20 NOTE — Assessment & Plan Note (Signed)
 Weight loss of 13.5 pounds during severe diarrhea episode, likely due to fluid loss and decreased intake. No dehydration signs, but inadequate hydration noted. - Monitor weight and nutritional intake. - Encourage adequate hydration and balanced diet.

## 2023-12-20 NOTE — Assessment & Plan Note (Signed)
 Acute Intermittent diarrhea for two weeks, initially severe. Differential includes infectious gastroenteritis, inflammatory bowel disease, stress-related symptoms, and alpha-gal syndrome. Recent stressors noted. No neurological symptoms to suggest Guillain-Barre syndrome. Emphasized identifying infectious agent before antibiotics. - Order stool studies for infectious causes. - Order CBC and CMP for infection, liver/kidney function, and electrolytes. - Order alpha-gal panel. - Refer to gastroenterologist if stool studies inconclusive. - Consider abdominal CT  - Discuss cognitive behavioral therapy or medication  management if stress-related.

## 2023-12-20 NOTE — Assessment & Plan Note (Signed)
 Recent tick bite - Order alpha-gal panel. Await labs/testing for assessment and recommendations

## 2023-12-21 ENCOUNTER — Ambulatory Visit: Payer: Self-pay | Admitting: Family Medicine

## 2023-12-21 LAB — COMPREHENSIVE METABOLIC PANEL WITH GFR
ALT: 50 IU/L — ABNORMAL HIGH (ref 0–44)
AST: 26 IU/L (ref 0–40)
Albumin: 4.5 g/dL (ref 4.1–5.1)
Alkaline Phosphatase: 75 IU/L (ref 44–121)
BUN/Creatinine Ratio: 19 (ref 9–20)
BUN: 19 mg/dL (ref 6–20)
Bilirubin Total: 0.4 mg/dL (ref 0.0–1.2)
CO2: 22 mmol/L (ref 20–29)
Calcium: 9.2 mg/dL (ref 8.7–10.2)
Chloride: 102 mmol/L (ref 96–106)
Creatinine, Ser: 0.98 mg/dL (ref 0.76–1.27)
Globulin, Total: 2.4 g/dL (ref 1.5–4.5)
Glucose: 98 mg/dL (ref 70–99)
Potassium: 5 mmol/L (ref 3.5–5.2)
Sodium: 138 mmol/L (ref 134–144)
Total Protein: 6.9 g/dL (ref 6.0–8.5)
eGFR: 101 mL/min/{1.73_m2} (ref >59)

## 2023-12-21 LAB — CBC WITH DIFFERENTIAL/PLATELET
Basophils Absolute: 0.1 10*3/uL (ref 0.0–0.2)
Basos: 1 %
EOS (ABSOLUTE): 0.1 10*3/uL (ref 0.0–0.4)
Eos: 1 %
Hematocrit: 40.9 % (ref 37.5–51.0)
Hemoglobin: 13.3 g/dL (ref 13.0–17.7)
Immature Grans (Abs): 0 10*3/uL (ref 0.0–0.1)
Immature Granulocytes: 0 %
Lymphocytes Absolute: 2.1 10*3/uL (ref 0.7–3.1)
Lymphs: 27 %
MCH: 29.4 pg (ref 26.6–33.0)
MCHC: 32.5 g/dL (ref 31.5–35.7)
MCV: 90 fL (ref 79–97)
Monocytes Absolute: 0.7 10*3/uL (ref 0.1–0.9)
Monocytes: 9 %
Neutrophils Absolute: 4.7 10*3/uL (ref 1.4–7.0)
Neutrophils: 62 %
Platelets: 293 10*3/uL (ref 150–450)
RBC: 4.53 x10E6/uL (ref 4.14–5.80)
RDW: 12.6 % (ref 11.6–15.4)
WBC: 7.7 10*3/uL (ref 3.4–10.8)

## 2023-12-22 ENCOUNTER — Ambulatory Visit: Payer: Self-pay

## 2023-12-22 ENCOUNTER — Ambulatory Visit (HOSPITAL_BASED_OUTPATIENT_CLINIC_OR_DEPARTMENT_OTHER)
Admission: EM | Admit: 2023-12-22 | Discharge: 2023-12-22 | Disposition: A | Discharge status: Home / Self Care | Attending: Family Medicine | Admitting: Family Medicine

## 2023-12-22 ENCOUNTER — Encounter (HOSPITAL_BASED_OUTPATIENT_CLINIC_OR_DEPARTMENT_OTHER): Payer: Self-pay | Admitting: Emergency Medicine

## 2023-12-22 DIAGNOSIS — A0472 Enterocolitis due to Clostridium difficile, not specified as recurrent: Secondary | ICD-10-CM

## 2023-12-22 DIAGNOSIS — A020 Salmonella enteritis: Secondary | ICD-10-CM

## 2023-12-22 LAB — ALPHA-GAL PANEL
Allergen Lamb IgE: <0.1 kU/L
Beef IgE: <0.1 kU/L
IgE (Immunoglobulin E), Serum: 31 [IU]/mL (ref 6–495)
O215-IgE Alpha-Gal: <0.1 kU/L
Pork IgE: <0.1 kU/L

## 2023-12-22 LAB — GI PROFILE, STOOL, PCR
Adenovirus F 40/41: NOT DETECTED
Astrovirus: NOT DETECTED
C difficile toxin A/B: DETECTED — AB
Campylobacter: NOT DETECTED
Cryptosporidium: NOT DETECTED
Cyclospora cayetanensis: NOT DETECTED
Entamoeba histolytica: NOT DETECTED
Enteroaggregative E coli: NOT DETECTED
Enteropathogenic E coli: NOT DETECTED
Enterotoxigenic E coli: NOT DETECTED
Giardia lamblia: NOT DETECTED
Norovirus GI/GII: NOT DETECTED
Plesiomonas shigelloides: NOT DETECTED
Rotavirus A: NOT DETECTED
Salmonella: DETECTED — AB
Sapovirus: NOT DETECTED
Shiga-toxin-producing E coli: NOT DETECTED
Shigella/Enteroinvasive E coli: NOT DETECTED
Vibrio cholerae: NOT DETECTED
Vibrio: NOT DETECTED
Yersinia enterocolitica: NOT DETECTED

## 2023-12-22 MED ORDER — DIFICID 200 MG PO TABS: 200.0000 mg | ORAL_TABLET | Freq: Two times a day (BID) | ORAL | 0 refills | Status: AC

## 2023-12-22 NOTE — Discharge Instructions (Addendum)
 Salmonella: Salmonella is self-limiting and at this point the patient is recovering and does not need specific treatment for Salmonella.  C. difficile diarrhea: Best treatment is Dificid, 200 mg twice daily for 10 days.  Get plenty of fluids and rest.  Provided information on oral rehydration solution and rehydration.  Follow-up with family practice if symptoms are not improving, worsen or if new symptoms occur.

## 2023-12-22 NOTE — ED Provider Notes (Signed)
 [name]    CSN: [id number] Arrival date & time: 12/22/23  1912      History   Chief Complaint No chief complaint on file.   HPI Harold Moyer is a 38 y.o. male.   Patient reports abdominal pain since 12/08/2023.  He said diarrhea as much is 20 and 30 times a day.  He saw Delford Felling FNP on 12/20/2023.  He got a variety of labs and stool cultures and a PCR stool test.  Not all of the labs are back.  His stool culture is still pending.  His PCR test shows Salmonella and C. difficile diarrhea.  He is here just not feeling well and wanting reevaluation since he had not had test results yet.  He was not aware of the results from the PCR test (these just resulted this afternoon).  He is feeling improved from his initial illness and symptoms on 12/08/2023.  But he is still having 15 and 20 stools a day.  He is also losing weight and wondering if he can eat or not.  He is pushing fluids and trying to stay hydrated.     History reviewed. No pertinent past medical history.  Patient Active Problem List   Diagnosis Date Noted   Diarrhea 12/20/2023   Tick bite 12/20/2023   Abnormal weight loss 12/20/2023   Baker's cyst of right knee 06/16/2015    History reviewed. No pertinent surgical history.     Home Medications    Prior to Admission medications   Medication Sig Start Date End Date Taking? Authorizing Provider  fidaxomicin (DIFICID) 200 MG TABS tablet Take 1 tablet (200 mg total) by mouth 2 (two) times daily for 10 days. 12/22/23 01/01/24 Yes Guss Legacy, FNP    Family History Family History  Problem Relation Age of Onset   Atrial fibrillation Mother    Hernia Mother    GER disease Mother    Sleep apnea Mother    Diabetes Father    Sleep apnea Brother    Spinal muscular atrophy Daughter    Alzheimer's disease Paternal Grandmother     Social History Social History   Tobacco Use   Smoking status: Former    Current packs/day: 0.00    Types: Cigarettes     Quit date: 2020    Years since quitting: 5.4   Smokeless tobacco: Never  Vaping Use   Vaping status: Never Used  Substance Use Topics   Alcohol use: Yes    Comment: occasionally   Drug use: No     Allergies   Claritin-d [loratadine-pseudoephedrine er]   Review of Systems Review of Systems  Constitutional:  Positive for fatigue. Negative for chills and fever.  HENT:  Negative for ear pain and sore throat.   Eyes:  Negative for pain and visual disturbance.  Respiratory:  Negative for cough.   Cardiovascular:  Negative for chest pain and palpitations.  Gastrointestinal:  Positive for diarrhea. Negative for abdominal pain, constipation, nausea and vomiting.  Genitourinary:  Negative for dysuria and hematuria.  Musculoskeletal:  Negative for arthralgias and back pain.  Skin:  Negative for color change and rash.  Neurological:  Positive for weakness. Negative for seizures and syncope.  All other systems reviewed and are negative.    Physical Exam Triage Vital Signs ED Triage Vitals  Encounter Vitals Group     BP 12/22/23 1925 129/80     Systolic BP Percentile --      Diastolic BP Percentile --  Pulse Rate 12/22/23 1925 67     Resp 12/22/23 1925 18     Temp 12/22/23 1925 98.2 F (36.8 C)     Temp Source 12/22/23 1925 Oral     SpO2 12/22/23 1925 97 %     Weight --      Height --      Head Circumference --      Peak Flow --      Pain Score 12/22/23 1924 6     Pain Loc --      Pain Education --      Exclude from Growth Chart --    No data found.  Updated Vital Signs BP 129/80 (BP Location: Right Arm)   Pulse 67   Temp 98.2 F (36.8 C) (Oral)   Resp 18   SpO2 97%   Visual Acuity Right Eye Distance:   Left Eye Distance:   Bilateral Distance:    Right Eye Near:   Left Eye Near:    Bilateral Near:     Physical Exam Vitals and nursing note reviewed.  Constitutional:      General: He is not in acute distress.    Appearance: He is well-developed. He is  not ill-appearing or toxic-appearing.  HENT:     Head: Normocephalic and atraumatic.     Right Ear: Hearing, tympanic membrane, ear canal and external ear normal.     Left Ear: Hearing, tympanic membrane, ear canal and external ear normal.     Nose: No congestion or rhinorrhea.     Right Sinus: No maxillary sinus tenderness or frontal sinus tenderness.     Left Sinus: No maxillary sinus tenderness or frontal sinus tenderness.     Mouth/Throat:     Lips: Pink.     Mouth: Mucous membranes are moist.     Pharynx: Uvula midline. No oropharyngeal exudate or posterior oropharyngeal erythema.     Tonsils: No tonsillar exudate.  Eyes:     Conjunctiva/sclera: Conjunctivae normal.     Pupils: Pupils are equal, round, and reactive to light.  Cardiovascular:     Rate and Rhythm: Normal rate and regular rhythm.     Heart sounds: S1 normal and S2 normal. No murmur heard. Pulmonary:     Effort: Pulmonary effort is normal. No respiratory distress.     Breath sounds: Normal breath sounds. No decreased breath sounds, wheezing, rhonchi or rales.  Abdominal:     General: Bowel sounds are normal.     Palpations: Abdomen is soft.     Tenderness: There is generalized abdominal tenderness (Diffuse and mild).  Musculoskeletal:        General: No swelling.     Cervical back: Neck supple.  Lymphadenopathy:     Head:     Right side of head: No submental, submandibular, tonsillar, preauricular or posterior auricular adenopathy.     Left side of head: No submental, submandibular, tonsillar, preauricular or posterior auricular adenopathy.     Cervical: No cervical adenopathy.     Right cervical: No superficial cervical adenopathy.    Left cervical: No superficial cervical adenopathy.  Skin:    General: Skin is warm and dry.     Capillary Refill: Capillary refill takes less than 2 seconds.     Findings: No rash.  Neurological:     Mental Status: He is alert and oriented to person, place, and time.   Psychiatric:        Mood and Affect: Mood normal.  UC Treatments / Results  Labs (all labs ordered are listed, but only abnormal results are displayed)  Contains abnormal data GI Profile, Stool, PCR: 12/21/23:     Component Ref Range & Units (hover) 1 d ago  Campylobacter Not Detected  C difficile toxin A/B Detected Abnormal   Plesiomonas shigelloides Not Detected  Salmonella Detected Abnormal   Vibrio Not Detected  Vibrio cholerae Not Detected  Yersinia enterocolitica Not Detected  Enteroaggregative E coli Not Detected  Enteropathogenic E coli Not Detected  Enterotoxigenic E coli Not Detected  Shiga-toxin-producing E coli Not Detected  E coli O157 Not applicable  Shigella/Enteroinvasive E coli Not Detected  Cryptosporidium Not Detected  Cyclospora cayetanensis Not Detected  Entamoeba histolytica Not Detected  Giardia lamblia Not Detected  Adenovirus F 40/41 Not Detected  Astrovirus Not Detected  Norovirus GI/GII Not Detected  Rotavirus A Not Detected  Sapovirus Not Detected  Resulting Agency [facility]     Comprehensive metabolic panel with GFR: 12/20/23    Component Ref Range & Units (hover) 2 d ago  Glucose 98  BUN 19  Creatinine, Ser 0.98  eGFR 101  BUN/Creatinine Ratio 19  Sodium 138  Potassium 5.0  Chloride 102  CO2 22  Calcium 9.2  Total Protein 6.9  Albumin 4.5  Globulin, Total 2.4  Bilirubin Total 0.4  Alkaline Phosphatase 75  AST 26  ALT 50 High   Resulting Agency [facility]     CBC with Differential: 12/20/23:     Component Ref Range & Units (hover) 2 d ago  WBC 7.7  RBC 4.53  Hemoglobin 13.3  Hematocrit 40.9  MCV 90  MCH 29.4  MCHC 32.5  RDW 12.6  Platelets 293  Neutrophils 62  Lymphs 27  Monocytes 9  Eos 1  Basos 1  Neutrophils Absolute 4.7  Lymphocytes Absolute 2.1  Monocytes Absolute 0.7  EOS (ABSOLUTE) 0.1  Basophils Absolute 0.1  Immature Granulocytes 0  Immature Grans (Abs) 0.0  Resulting Agency [facility]         EKG   Radiology No results found.  Procedures Procedures (including critical care time)  Medications Ordered in UC Medications - No data to display  Initial Impression / Assessment and Plan / UC Course  I have reviewed the triage vital signs and the nursing notes.  Pertinent labs & imaging results that were available during my care of the patient were reviewed by me and considered in my medical decision making (see chart for details).  Plan of Care: Salmonella: Provided education about Salmonella.  Given his appearance, vital signs and current rate of diarrhea, I believe he is already getting better.  Up-to-date guidelines indicate that treatment for Salmonella is not needed in a patient in his condition.  Discussed this with the patient and provided educational support about Salmonella.  C. difficile diarrhea: Patient does need treatment for the C. difficile.  Will use Dificid, 200 mg, twice daily for 10 days.  Provided education about C. difficile diarrhea.  Provided education about rehydration and oral rehydration solution.  Educated and encouraged to follow the brat diet.  Follow-up if symptoms do not improve, worsen or new symptoms occur.  Follow-up with family doctor.  I reviewed the plan of care with the patient and/or the patient's guardian.  The patient and/or guardian had time to ask questions and acknowledged that the questions were answered.  I provided instruction on symptoms or reasons to return here or to go to an ER, if symptoms/condition did not improve, worsened or  if new symptoms occurred.  Final Clinical Impressions(s) / UC Diagnoses   Final diagnoses:  Salmonella dysentery  Enteritis due to Clostridium difficile     Discharge Instructions      Salmonella: Salmonella is self-limiting and at this point the patient is recovering and does not need specific treatment for Salmonella.  C. difficile diarrhea: Best treatment is Dificid, 200 mg twice daily  for 10 days.  Get plenty of fluids and rest.  Provided information on oral rehydration solution and rehydration.  Follow-up with family practice if symptoms are not improving, worsen or if new symptoms occur.   ED Prescriptions     Medication Sig Dispense Auth. Provider   fidaxomicin (DIFICID) 200 MG TABS tablet Take 1 tablet (200 mg total) by mouth 2 (two) times daily for 10 days. 20 tablet Treveon Bourcier, FNP      PDMP not reviewed this encounter.   Guss Legacy, FNP 12/22/23 2102

## 2023-12-22 NOTE — Telephone Encounter (Signed)
 Spoke with patient, patient states he is still having diarrhea about 10 times a day and wants to know if he should take any kind of medication.

## 2023-12-22 NOTE — Telephone Encounter (Signed)
 Reason for Disposition  [1] Caller requesting NON-URGENT health information AND [2] PCP's office is the best resource  Answer Assessment - Initial Assessment Questions 1. REASON FOR CALL or QUESTION: "What is your reason for calling today?" or "How can I best help you?" or "What question do you have that I can help answer?"     Pt wanted to know when stool test and Alpha Gal results would be in.  Protocols used: Information Only Call - No Triage-A-AH

## 2023-12-22 NOTE — Telephone Encounter (Signed)
 FYI Only or Action Required?: Action required by provider  Patient was last seen in primary care on 12/20/2023 by Janece Means, FNP. Called Nurse Triage reporting No chief complaint on file.. Symptoms began today. Interventions attempted: Nothing. Symptoms are: stable.  Triage Disposition: Call PCP When Office is Open  Patient/caregiver understands and will follow disposition?: Yes  Pt is asking how long it will take for test results to be released for stool and alpha gal. Please advise.

## 2023-12-22 NOTE — ED Triage Notes (Signed)
 Pt c/o abdominal pain on May 22nd he seen Dr. Howard Macho they did blood work and said that his ALT is high still waiting on alpha gal and stool culture. Pt has been having diarrhea going about 20 times a day but the nausea started today.

## 2023-12-23 ENCOUNTER — Other Ambulatory Visit: Payer: Self-pay

## 2023-12-23 ENCOUNTER — Other Ambulatory Visit (HOSPITAL_COMMUNITY): Payer: Self-pay

## 2023-12-23 MED ORDER — DIFICID 200 MG PO TABS
200.0000 mg | ORAL_TABLET | Freq: Two times a day (BID) | ORAL | 0 refills | Status: DC
  Filled 2023-12-23: qty 20, 10d supply, fill #0

## 2023-12-26 ENCOUNTER — Encounter

## 2023-12-26 MED ORDER — GABAPENTIN 600 MG PO TABS
600 | ORAL_TABLET | Freq: Three times a day (TID) | ORAL | 2 refills | 30.00000 days | Status: AC
Start: 2023-12-26 — End: 2024-01-25

## 2023-12-26 NOTE — Telephone Encounter (Signed)
 Requesting refill on gabapentin  to be sent to [facility]  Last office visit 11/26/2022

## 2024-01-10 ENCOUNTER — Telehealth: Payer: Self-pay

## 2024-01-10 NOTE — Telephone Encounter (Signed)
 Copied from CRM [id number]. Topic: Clinical - Medical Advice >> Jan 10, 2024 12:29 PM Tobias CROME wrote: Reason for CRM: Pt's wife inquiring if patient is needing a follow up visit. Pt had C Diff and salmonella around 12/22/23. Confirmed by urgent care.   Wife inquiring if pt would need follow up visit, wife denies any new or worsening symptoms.   Please advise

## 2024-01-11 ENCOUNTER — Telehealth: Payer: Self-pay | Admitting: Family Medicine

## 2024-01-11 NOTE — Telephone Encounter (Signed)
 Spoke with patient regarding message from his wife regarding follow-up for his C-Diff and salmonella results. He was treated with Dificid , 200 mg, twice daily for 10 days and reports feeling better after day 3 of treatment. He states that he finished the the course of antibiotics and is feeling fine. I recommended him having his liver enzymes repeated to make sure they have returned to normal. Patient declined at this time.

## 2024-01-12 ENCOUNTER — Encounter: Payer: Medicaid (Managed Care) | Attending: Internal Medicine | Primary: Internal Medicine

## 2024-01-13 NOTE — Telephone Encounter (Signed)
 Mailed no show letter for the date of 01/12/2024, scheduled with Dr Von

## 2024-01-27 NOTE — Progress Notes (Signed)
 No show

## 2024-02-14 ENCOUNTER — Encounter (HOSPITAL_BASED_OUTPATIENT_CLINIC_OR_DEPARTMENT_OTHER): Payer: Self-pay

## 2024-02-14 ENCOUNTER — Ambulatory Visit (HOSPITAL_BASED_OUTPATIENT_CLINIC_OR_DEPARTMENT_OTHER)
Admission: EM | Admit: 2024-02-14 | Discharge: 2024-02-14 | Disposition: A | Discharge status: Home / Self Care | Attending: Family Medicine | Admitting: Family Medicine

## 2024-02-14 DIAGNOSIS — J029 Acute pharyngitis, unspecified: Secondary | ICD-10-CM | POA: Diagnosis not present

## 2024-02-14 LAB — POCT RAPID STREP A (OFFICE): Rapid Strep A Screen: NEGATIVE

## 2024-02-14 NOTE — Discharge Instructions (Signed)
 Your strep test was negative. This is most likely viral and should run its course.  If symptoms worsen let us  know. You can take Ibuprofen for pain and Tylenol. Warm salt water to the throat as needed.  Plain mucinex should be fine for congestion.

## 2024-02-14 NOTE — ED Provider Notes (Signed)
 [facility]    CSN: [id number] Arrival date & time: 02/14/24  1455      History   Chief Complaint Chief Complaint  Patient presents with   Sore Throat    HPI Harold Moyer is a 38 y.o. male.   Pt is a 38 year old male that presents with  sore throat that started on Sunday. He is also having post nasal drainage, cough-mostly productive, and body aches. pt denies facial pain or HA. He has not taken his temp so he is unsure if he has had a fever of not. He was taking chloraseptic for the sore throat and had some relief but he started to develop a rash on his neck and chest so he stopped taking it and the rash cleared up. Wife recently dx with strep and son has ear infection.    Sore Throat    History reviewed. No pertinent past medical history.  Patient Active Problem List   Diagnosis Date Noted   Diarrhea 12/20/2023   Tick bite 12/20/2023   Abnormal weight loss 12/20/2023   Baker's cyst of right knee 06/16/2015    History reviewed. No pertinent surgical history.     Home Medications    Prior to Admission medications   Not on File    Family History Family History  Problem Relation Age of Onset   Atrial fibrillation Mother    Hernia Mother    GER disease Mother    Sleep apnea Mother    Diabetes Father    Sleep apnea Brother    Spinal muscular atrophy Daughter    Alzheimer's disease Paternal Grandmother     Social History Social History   Tobacco Use   Smoking status: Former    Current packs/day: 0.00    Types: Cigarettes    Quit date: 2020    Years since quitting: 5.5   Smokeless tobacco: Never  Vaping Use   Vaping status: Never Used  Substance Use Topics   Alcohol use: Yes    Comment: occasionally   Drug use: No     Allergies   Claritin-d [loratadine-pseudoephedrine er]   Review of Systems Review of Systems  See HPI Physical Exam Triage Vital Signs ED Triage Vitals  Encounter Vitals Group     BP 02/14/24 1513 123/76      Girls Systolic BP Percentile --      Girls Diastolic BP Percentile --      Boys Systolic BP Percentile --      Boys Diastolic BP Percentile --      Pulse Rate 02/14/24 1513 88     Resp 02/14/24 1513 20     Temp 02/14/24 1513 98.5 F (36.9 C)     Temp Source 02/14/24 1513 Oral     SpO2 02/14/24 1513 96 %     Weight --      Height --      Head Circumference --      Peak Flow --      Pain Score 02/14/24 1511 3     Pain Loc --      Pain Education --      Exclude from Growth Chart --    No data found.  Updated Vital Signs BP 123/76 (BP Location: Right Arm)   Pulse 88   Temp 98.5 F (36.9 C) (Oral)   Resp 20   SpO2 96%   Visual Acuity Right Eye Distance:   Left Eye Distance:   Bilateral Distance:  Right Eye Near:   Left Eye Near:    Bilateral Near:     Physical Exam Constitutional:      General: He is not in acute distress.    Appearance: Normal appearance. He is not ill-appearing, toxic-appearing or diaphoretic.  HENT:     Right Ear: Ear canal and external ear normal. There is impacted cerumen.     Left Ear: Ear canal and external ear normal. There is impacted cerumen.     Nose: Congestion present.     Mouth/Throat:     Pharynx: Oropharynx is clear.  Eyes:     Conjunctiva/sclera: Conjunctivae normal.  Cardiovascular:     Rate and Rhythm: Normal rate and regular rhythm.     Pulses: Normal pulses.     Heart sounds: Normal heart sounds.  Pulmonary:     Effort: Pulmonary effort is normal.     Breath sounds: Normal breath sounds.  Musculoskeletal:        General: Normal range of motion.     Cervical back: Normal range of motion.  Lymphadenopathy:     Cervical: No cervical adenopathy.  Skin:    General: Skin is warm and dry.  Neurological:     Mental Status: He is alert.  Psychiatric:        Mood and Affect: Mood normal.      UC Treatments / Results  Labs (all labs ordered are listed, but only abnormal results are displayed) Labs Reviewed  POCT  RAPID STREP A (OFFICE) - Normal    EKG   Radiology No results found.  Procedures Procedures (including critical care time)  Medications Ordered in UC Medications - No data to display  Initial Impression / Assessment and Plan / UC Course  I have reviewed the triage vital signs and the nursing notes.  Pertinent labs & imaging results that were available during my care of the patient were reviewed by me and considered in my medical decision making (see chart for details).     Sore throat- rapid strep test negative. This is most likely viral.  Recommend OTC meds as needed.  ? Allergy to chloraseptic spray. Recommend not use this anymore.  F/U as needed.   Final Clinical Impressions(s) / UC Diagnoses   Final diagnoses:  Sore throat     Discharge Instructions      Your strep test was negative. This is most likely viral and should run its course.  If symptoms worsen let us  know. You can take Ibuprofen for pain and Tylenol. Warm salt water to the throat as needed.  Plain mucinex should be fine for congestion.     ED Prescriptions   None    PDMP not reviewed this encounter.   Adah Wilbert LABOR, FNP 02/14/24 1642

## 2024-02-14 NOTE — ED Triage Notes (Signed)
 Pt c/o sore throat that started on Sunday. He is also having post nasal drainage, cough-mostly productive, and body aches. pt denies facial pain or HA. He has not taken his temp so he is unsure if he has had a fever of not. He was taking chloraseptic for the sore throat and had some relief but he started to develop a rash on his neck and chest so he stopped taking it and the rash cleared up.

## 2024-02-20 ENCOUNTER — Encounter: Payer: Medicaid (Managed Care) | Attending: Internal Medicine | Primary: Internal Medicine

## 2024-02-23 ENCOUNTER — Encounter: Payer: Medicaid (Managed Care) | Attending: Internal Medicine | Primary: Internal Medicine

## 2024-03-01 ENCOUNTER — Other Ambulatory Visit (HOSPITAL_COMMUNITY): Payer: Self-pay

## 2024-03-07 NOTE — Progress Notes (Signed)
 No show

## 2024-03-20 ENCOUNTER — Encounter

## 2024-03-20 MED ORDER — GABAPENTIN 600 MG PO TABS
600 | ORAL_TABLET | Freq: Three times a day (TID) | ORAL | 2 refills | 30.00000 days | Status: DC
Start: 2024-03-20 — End: 2024-06-19

## 2024-04-05 ENCOUNTER — Emergency Department: Admit: 2024-04-05 | Payer: Medicaid (Managed Care) | Primary: Internal Medicine

## 2024-04-05 ENCOUNTER — Inpatient Hospital Stay: Admit: 2024-04-05 | Payer: Medicaid (Managed Care) | Primary: Internal Medicine

## 2024-04-05 ENCOUNTER — Inpatient Hospital Stay
Admission: EM | Admit: 2024-04-05 | Discharge: 2024-04-06 | Disposition: A | Discharge status: Home / Self Care | Payer: Medicaid (Managed Care) | Admitting: Internal Medicine

## 2024-04-05 DIAGNOSIS — Z79899 Other long term (current) drug therapy: Secondary | ICD-10-CM

## 2024-04-05 DIAGNOSIS — T403X1A Poisoning by methadone, accidental (unintentional), initial encounter: Secondary | ICD-10-CM

## 2024-04-05 LAB — URINALYSIS WITH REFLEX TO CULTURE
Bilirubin, Urine: NEGATIVE
Glucose, Ur: NEGATIVE mg/dL
Leukocyte Esterase, Urine: NEGATIVE
Nitrite, Urine: NEGATIVE
Protein, UA: NEGATIVE mg/dL
Specific Gravity, UA: 1.019 (ref 1.000–1.030)
Urine Hgb: NEGATIVE
Urobilinogen, Urine: NORMAL EU/dL (ref 0.0–1.0)
pH, Urine: 7.5 (ref 5.0–8.0)

## 2024-04-05 LAB — COMPREHENSIVE METABOLIC PANEL
ALT: 46 U/L (ref 10–50)
AST: 51 U/L — ABNORMAL HIGH (ref 10–50)
Albumin: 3.9 g/dL (ref 3.5–5.2)
Alkaline Phosphatase: 85 U/L (ref 40–129)
Anion Gap: 11 mmol/L (ref 9–16)
BUN: 10 mg/dL (ref 6–20)
CO2: 26 mmol/L (ref 20–31)
Calcium: 9.1 mg/dL (ref 8.6–10.4)
Chloride: 104 mmol/L (ref 98–107)
Creatinine: 0.8 mg/dL (ref 0.7–1.2)
Est, Glom Filt Rate: >90 mL/min/1.73m2 (ref >60)
Glucose: 87 mg/dL (ref 74–99)
Potassium: 4.5 mmol/L (ref 3.7–5.3)
Sodium: 141 mmol/L (ref 136–145)
Total Bilirubin: <0.2 mg/dL (ref 0.0–1.2)
Total Protein: 6.6 g/dL (ref 6.6–8.7)

## 2024-04-05 LAB — BLOOD GAS, ARTERIAL
Carboxyhemoglobin: 1.2 % (ref 0–5)
HCO3, Arterial: 23 mmol/L (ref 22.0–26.0)
Methemoglobin: 0.1 % (ref 0.0–1.9)
Negative Base Excess, Art: 1.4 mmol/L (ref 0.0–2.0)
O2 Sat, Arterial: 95.2 % (ref 95–98)
Respiratory Rate: 15
pCO2, Arterial: 37.8 mmHg (ref 35.0–45.0)
pH, Arterial: 7.402 (ref 7.350–7.450)
pO2, Arterial: 79.6 mmHg — ABNORMAL LOW (ref 80.0–100.0)

## 2024-04-05 LAB — EKG 12-LEAD
Atrial Rate: 100 {beats}/min
P Axis: 65 degrees
P-R Interval: 168 ms
Q-T Interval: 362 ms
QRS Duration: 92 ms
QTc Calculation (Bazett): 466 ms
R Axis: 20 degrees
T Axis: 55 degrees
Ventricular Rate: 100 {beats}/min

## 2024-04-05 LAB — CBC WITH AUTO DIFFERENTIAL
Basophils %: 0 % (ref 0–2)
Basophils Absolute: 0.03 k/uL (ref 0.00–0.20)
Eosinophils %: 2 % (ref 0–4)
Eosinophils Absolute: 0.17 k/uL (ref 0.00–0.44)
Hematocrit: 39.1 % — ABNORMAL LOW (ref 41.0–53.0)
Hemoglobin: 12.8 g/dL — ABNORMAL LOW (ref 13.5–17.5)
Immature Granulocytes %: 0 %
Immature Granulocytes Absolute: 0.05 k/uL (ref 0.00–0.30)
Lymphocytes %: 18 % — ABNORMAL LOW (ref 24–44)
Lymphocytes Absolute: 2.11 k/uL (ref 1.10–3.70)
MCH: 28.5 pg (ref 26.0–34.0)
MCHC: 32.7 g/dL (ref 31.0–37.0)
MCV: 87.1 fL (ref 80.0–100.0)
MPV: 10.6 fL (ref 8.0–13.5)
Monocytes %: 7 % (ref 3–12)
Monocytes Absolute: 0.85 k/uL (ref 0.10–1.20)
NRBC Automated: 0 /100{WBCs}
Neutrophils %: 73 % — ABNORMAL HIGH (ref 36–66)
Neutrophils Absolute: 8.47 k/uL — ABNORMAL HIGH (ref 1.50–8.10)
Platelets: 161 k/uL (ref 150–450)
RBC: 4.49 m/uL (ref 4.21–5.77)
RDW: 13 % (ref 11.5–14.9)
WBC: 11.7 k/uL — ABNORMAL HIGH (ref 3.5–11.0)

## 2024-04-05 LAB — URINE DRUG SCREEN
Amphetamine Screen, Ur: NEGATIVE
Barbiturate Screen, Ur: NEGATIVE
Benzodiazepine Screen, Urine: POSITIVE — AB
Cannabinoid Scrn, Ur: NEGATIVE
Cocaine Metabolite, Urine: NEGATIVE
Fentanyl, Ur: NEGATIVE
Methadone Screen, Urine: POSITIVE — AB
Opiates, Urine: NEGATIVE
Oxycodone Screen, Ur: NEGATIVE
Phencyclidine, Urine: NEGATIVE

## 2024-04-05 LAB — ACETAMINOPHEN LEVEL: Acetaminophen Level: <5 ug/mL — ABNORMAL LOW (ref 10–30)

## 2024-04-05 LAB — AMMONIA: Ammonia: 28 umol/L (ref 16–60)

## 2024-04-05 LAB — TROPONIN: Troponin, High Sensitivity: <6 ng/L (ref 0–22)

## 2024-04-05 LAB — ETHANOL
Ethanol Lvl: <10 mg/dL (ref <10)
Ethanol percent: 0.001 % (ref <0.010)

## 2024-04-05 LAB — SALICYLATE LEVEL: Salicylate Lvl: <1 mg/dL (ref 0.0–10.0)

## 2024-04-05 LAB — TSH REFLEX TO FT4: TSH: 1.52 u[IU]/mL (ref 0.27–4.20)

## 2024-04-05 MED ORDER — POTASSIUM CHLORIDE CRYS ER 20 MEQ PO TBCR
20 | ORAL | Status: DC | PRN
Start: 2024-04-05 — End: 2024-04-06

## 2024-04-05 MED ORDER — SODIUM CHLORIDE 0.9 % IV SOLN
0.9 | INTRAVENOUS | Status: DC
Start: 2024-04-05 — End: 2024-04-05
  Administered 2024-04-05: 22:00:00 via INTRAVENOUS

## 2024-04-05 MED ORDER — ACETAMINOPHEN 650 MG RE SUPP
650 | Freq: Four times a day (QID) | RECTAL | Status: DC | PRN
Start: 2024-04-05 — End: 2024-04-06

## 2024-04-05 MED ORDER — ONDANSETRON HCL 4 MG/2ML IJ SOLN
4 | Freq: Four times a day (QID) | INTRAMUSCULAR | Status: DC | PRN
Start: 2024-04-05 — End: 2024-04-06

## 2024-04-05 MED ORDER — SODIUM CHLORIDE 0.9 % IV BOLUS
0.9 | Freq: Once | INTRAVENOUS | Status: AC
Start: 2024-04-05 — End: 2024-04-05
  Administered 2024-04-05: 19:00:00 1000 mL via INTRAVENOUS

## 2024-04-05 MED ORDER — ACETAMINOPHEN 325 MG PO TABS
325 | Freq: Four times a day (QID) | ORAL | Status: DC | PRN
Start: 2024-04-05 — End: 2024-04-06

## 2024-04-05 MED ORDER — ENOXAPARIN SODIUM 40 MG/0.4ML IJ SOSY
40 | Freq: Every day | INTRAMUSCULAR | Status: DC
Start: 2024-04-05 — End: 2024-04-06

## 2024-04-05 MED ORDER — POLYETHYLENE GLYCOL 3350 17 G PO PACK
17 | Freq: Every day | ORAL | Status: DC | PRN
Start: 2024-04-05 — End: 2024-04-06

## 2024-04-05 MED ORDER — SODIUM CHLORIDE 0.9 % IV SOLN
0.9 | INTRAVENOUS | Status: DC | PRN
Start: 2024-04-05 — End: 2024-04-06

## 2024-04-05 MED ORDER — NORMAL SALINE FLUSH 0.9 % IV SOLN
0.9 | INTRAVENOUS | Status: DC | PRN
Start: 2024-04-05 — End: 2024-04-06

## 2024-04-05 MED ORDER — ONDANSETRON 4 MG PO TBDP
4 | Freq: Three times a day (TID) | ORAL | Status: DC | PRN
Start: 2024-04-05 — End: 2024-04-06

## 2024-04-05 MED ORDER — POTASSIUM BICARB-CITRIC ACID 20 MEQ PO TBEF
20 | ORAL | Status: DC | PRN
Start: 2024-04-05 — End: 2024-04-06

## 2024-04-05 MED ORDER — SODIUM CHLORIDE 0.9 % IV SOLN
0.9 | INTRAVENOUS | Status: DC
Start: 2024-04-05 — End: 2024-04-06

## 2024-04-05 MED ORDER — MAGNESIUM SULFATE 2 GM/50ML IV SOLN
2 | INTRAVENOUS | Status: DC | PRN
Start: 2024-04-05 — End: 2024-04-06

## 2024-04-05 MED ORDER — POTASSIUM CHLORIDE 10 MEQ/100ML IV SOLN
10 | INTRAVENOUS | Status: DC | PRN
Start: 2024-04-05 — End: 2024-04-06

## 2024-04-05 MED ORDER — NORMAL SALINE FLUSH 0.9 % IV SOLN
0.9 | Freq: Two times a day (BID) | INTRAVENOUS | Status: DC
Start: 2024-04-05 — End: 2024-04-06
  Administered 2024-04-06: 13:00:00 10 mL via INTRAVENOUS
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
## 2024-04-05 NOTE — Progress Notes (Signed)
 Notified dr barbie of consult

## 2024-04-05 NOTE — Progress Notes (Signed)
 Pharmacy Note:     Contacted Poison Control for suspected unintentional overdose of unknown medication. Discussed case with Zachary, [facility]. Discussed current vitals, labs, imaging, EKG, and medications. He recommended addition of acetaminophen , salicylate, and ethanol levels, as well as an observation period of 24 hours from time of presentation. Relayed discussion to Dr. Maurice.     Thank you,  Andrea Birmingham, PharmD, BCPS, BCEMP  [phone]
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
## 2024-04-05 NOTE — Progress Notes (Signed)
 Pharmacy Medication History Note      List of current medications patient is taking is incomplete.    Source of information: [facility], Care Everywhere, [facility]     Changes made to medication list:  Medications removed (include reason, ex. therapy complete or physician discontinued, noncompliance):  Ketorolac  - course complete   Lidocaine  - course complete     Medications flagged for provider review:  Duloxetine - appears to be changed to Pristiq. Last filled in 2023.   Famotidine  - last filled in 2023  Omeprazole  - last filled in 2023     Medications added/doses adjusted:  Clonazepam adjusted to 0.5 mg twice daily as needed   Clonidine adjusted to 0.1 mg daily as needed   Desvenlafaxine 100 mg daily   Methadone  - dose requires confirmation with [facility]   Mirtazapine 7.5 mg nightly   Quetiapine 25 mg take 1-2 tablets at bedtime and nightly as needed     Other notes (ex. Recent course of antibiotics, Coumadin dosing):  Per [facility], last fill of clonazepam was on 03/26/24 with quantity 60 for 30 days.   Per [facility], last fill of gabapentin  was on 03/21/24 with quantity 90 for 30 days.   The patient is on methadone  through [facility], but they are currently closed. Please contact when they reopen to confirm current dosing.       Current Home Medication List at Time of Admission:  Prior to Admission medications   Medication Sig   desvenlafaxine succinate (PRISTIQ) 100 MG TB24 extended release tablet Take 1 tablet by mouth daily   mirtazapine (REMERON) 7.5 MG tablet Take 1 tablet by mouth nightly   QUEtiapine (SEROQUEL) 25 MG tablet Take 1-2 tablets by mouth At bedtime and nightly as needed   methadone  10 MG/5ML solution Take by mouth daily.  Please confirm dose with [facility] once they reopen   gabapentin  (NEURONTIN ) 600 MG tablet Take 1 tablet by mouth 3 times daily for 30 days.   cloNIDine (CATAPRES) 0.1 MG tablet Take 1 tablet by mouth daily as needed   clonazePAM (KLONOPIN) 0.5 MG tablet Take 1 tablet by mouth 2 times daily  as needed for Anxiety.   omeprazole  (PRILOSEC) 40 MG delayed release capsule Patient not taking: Reported on 04/05/2024   famotidine  (PEPCID ) 40 MG tablet Patient not taking: Reported on 04/05/2024   DULoxetine (CYMBALTA) 20 MG extended release capsule Patient not taking: Reported on 04/05/2024         Please let me know if you have any questions about this encounter. Thank you!    Electronically signed by Andrea Birmingham, RPH on 04/05/2024 at 5:19 PM

## 2024-04-05 NOTE — Progress Notes (Signed)
 Patient wanting to leave.  Explained that he is on a medical hold and he needs to stay at the hospital tonight

## 2024-04-06 LAB — CBC WITH AUTO DIFFERENTIAL
Basophils %: 0 % (ref 0–2)
Basophils Absolute: <0.03 k/uL (ref 0.00–0.20)
Eosinophils %: 3 % (ref 0–4)
Eosinophils Absolute: 0.22 k/uL (ref 0.00–0.44)
Hematocrit: 39.1 % — ABNORMAL LOW (ref 41.0–53.0)
Hemoglobin: 12.6 g/dL — ABNORMAL LOW (ref 13.5–17.5)
Immature Granulocytes %: 0 %
Immature Granulocytes Absolute: <0.03 k/uL (ref 0.00–0.30)
Lymphocytes %: 38 % (ref 24–44)
Lymphocytes Absolute: 2.63 k/uL (ref 1.10–3.70)
MCH: 28.3 pg (ref 26.0–34.0)
MCHC: 32.2 g/dL (ref 31.0–37.0)
MCV: 87.9 fL (ref 80.0–100.0)
MPV: 10.8 fL (ref 8.0–13.5)
Monocytes %: 8 % (ref 3–12)
Monocytes Absolute: 0.59 k/uL (ref 0.10–1.20)
NRBC Automated: 0 /100{WBCs}
Neutrophils %: 51 % (ref 36–66)
Neutrophils Absolute: 3.52 k/uL (ref 1.50–8.10)
Platelets: 160 k/uL (ref 150–450)
RBC: 4.45 m/uL (ref 4.21–5.77)
RDW: 13 % (ref 11.5–14.9)
WBC: 7 k/uL (ref 3.5–11.0)

## 2024-04-06 LAB — BASIC METABOLIC PANEL W/ REFLEX TO MG FOR LOW K
Anion Gap: 11 mmol/L (ref 9–16)
BUN: 7 mg/dL (ref 6–20)
CO2: 23 mmol/L (ref 20–31)
Calcium: 8.5 mg/dL — ABNORMAL LOW (ref 8.6–10.4)
Chloride: 106 mmol/L (ref 98–107)
Creatinine: 0.7 mg/dL (ref 0.7–1.2)
Est, Glom Filt Rate: >90 mL/min/1.73m2 (ref >60)
Glucose: 97 mg/dL (ref 74–99)
Potassium: 3.9 mmol/L (ref 3.7–5.3)
Sodium: 140 mmol/L (ref 136–145)

## 2024-04-06 LAB — PROCALCITONIN: Procalcitonin: 0.06 ng/mL (ref 0.00–0.09)

## 2024-04-06 MED ORDER — METHADONE HCL 10 MG/ML PO CONC
10 | Freq: Every day | ORAL | Status: DC
Start: 2024-04-06 — End: 2024-04-06
  Administered 2024-04-06: 14:00:00 150 mg via ORAL

## 2024-04-06 MED FILL — ENOXAPARIN SODIUM 40 MG/0.4ML IJ SOSY: 40 MG/0.4ML | INTRAMUSCULAR | Qty: 0.4 | Fill #0

## 2024-04-06 MED FILL — METHADOSE 10 MG/ML PO CONC: 10 mg/mL | ORAL | Qty: 15 | Fill #0

## 2024-04-06 NOTE — Progress Notes (Signed)
 RN notified Dr. Lynell that pt is wanting to leave AMA and requested she come and speak with pt

## 2024-04-06 NOTE — Progress Notes (Signed)
 Per Dr. Barbie, pt is okay for discharge.
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
## 2024-04-06 NOTE — Progress Notes (Signed)
 Discharge teaching completed with patient using teachback method. AVS reviewed. Patient voiced understanding regarding prescriptions, follow up appointments, and care of self at home. Denies questions. IV d/c'd with cath intact and drsg applied. Skin Pk/W/D. Easy respirations. D/c'd with all belongings. Discharged in a wheelchair to  home with support per cab

## 2024-04-06 NOTE — Discharge Instructions (Signed)
 Activity as tolerated

## 2024-04-06 NOTE — Discharge Summary (Signed)
 IN-PATIENT SERVICE   [facility] [place]  Clinic Internal Medicine    Discharge Summary     Patient ID: David [name]  DOB:  10-11-1985   MRN: [id number]     ACCOUNT:  [id number]   Patient's PCP: David Kleine, MD  Admit Date: 04/05/2024   Discharge Date: 04/09/2024    Length of Stay: 1  Code Status:  Prior  Admitting Physician: [name] Lynell, MD  Discharge Physician: [name] Lynell, MD     Active Discharge Diagnoses:     Primary Problem  Drug overdose, accidental or unintentional, initial encounter      Hospital Problems  Active Hospital Problems    Diagnosis Date Noted    Toxic encephalopathy [G92.9] 04/06/2024    Opioid use disorder [F11.90] 04/06/2024    GAD (generalized anxiety disorder) [F41.1] 04/06/2024    Drug overdose, accidental or unintentional, initial encounter [T50.901A] 04/05/2024    Polypharmacy [Z79.899] 04/05/2024       Admission Condition:  fair     Discharged Condition: fair    Hospital Stay:     Hospital Course:  David [name] is a 38 y.o. male who was admitted for the management of Drug overdose, accidental or unintentional, initial encounter , presented to ER with Fatigue (Started last night. )      Patient is 38 year old male past medical history of opioid dependence, on methadone , chronic back pain on gabapentin  anxiety depression chronic pain presented to the ER with generalized weakness fatigue and drowsiness.  As per report, patient went to get his methadone  but he could not get it since he was very drowsy and recommended to come to ER  In the ER no family member was present at the bedside, patient was extremely drowsy, could mumble that the year is 2025, following, commands but going back to sleep  Could not provide any history  UTOX positive for benzodiazepine and methadone   Not sure if he intentionally overdosed on any of the prescribed medication  Head CT negative  Will check ammonia ABG  Had leukocytosis UA negative, chest x-ray ordered    Patient was very drowsy on presentation concern of  accidental/intentional overdose  Patient on multiple sedatives, patient states that since he started Remeron, he does get sleepy Remeron DC'd at the time of discharge advised to follow-up with PCP and psychiatry pain management as outpatient might need to cut down the dose of methadone  and gabapentin   Patient was alert oriented on the day of discharge seen by psych and neuro cleared for discharge discharged home in stable can  Significant therapeutic interventions:     Significant Diagnostic Studies:   Labs / Micro:        Radiology:    XR CHEST PORTABLE  Result Date: 04/05/2024  EXAMINATION: ONE XRAY VIEW OF THE CHEST 04/05/2024 5:37 pm COMPARISON: None. HISTORY: ORDERING SYSTEM PROVIDED HISTORY: leucocytosis TECHNOLOGIST PROVIDED HISTORY: leucocytosis Reason for Exam: leucocytosis FINDINGS: No osseous abnormality observed.  Cardiac silhouette and mediastinum within normal limits.  Right lung clear.  Mild left infrahilar atelectasis or infiltrate.  No effusions.     Mild left infrahilar atelectasis or infiltrate.     CT HEAD WO CONTRAST  Result Date: 04/05/2024  EXAMINATION: CT OF THE HEAD WITHOUT CONTRAST  04/05/2024 1:51 pm TECHNIQUE: CT of the head was performed without the administration of intravenous contrast. Automated exposure control, iterative reconstruction, and/or weight based adjustment of the mA/kV was utilized to reduce the radiation dose to as low as reasonably achievable. COMPARISON: None.  HISTORY: ORDERING SYSTEM PROVIDED HISTORY: ams TECHNOLOGIST PROVIDED HISTORY: ams Decision Support Exception - unselect if not a suspected or confirmed emergency medical condition->Emergency Medical Condition (MA) Reason for Exam: AMS FINDINGS: BRAIN/VENTRICLES: There is no acute intracranial hemorrhage, mass effect or midline shift.  No abnormal extra-axial fluid collection.  The gray-white differentiation is maintained without evidence of an acute infarct.  There is no evidence of hydrocephalus. ORBITS: The  visualized portion of the orbits demonstrate no acute abnormality. SINUSES: The visualized paranasal sinuses and mastoid air cells demonstrate no acute abnormality. SOFT TISSUES/SKULL:  No acute abnormality of the visualized skull or soft tissues.     No acute intracranial abnormality.         Consultations:    Consults:     Final Specialist Recommendations/Findings:   IP CONSULT TO INTERNAL MEDICINE  IP CONSULT TO PULMONOLOGY  IP CONSULT TO PSYCHIATRY  IP CONSULT TO NEUROLOGY      The patient was seen and examined on day of discharge and this discharge summary is in conjunction with any daily progress note from day of discharge.    Discharge plan:     Disposition: Home    Physician Follow Up:   David Kleine, MD  [address]  [place]  [place] [address]  [phone]    Follow up in 1 week(s)      David Kleine, MD  [address]  [place]  [place] [address]  [phone]             Requiring Further Evaluation/Follow Up POST HOSPITALIZATION/Incidental Findings:    Diet: cardiac diet    Activity: As tolerated    Instructions to Patient:     Discharge Medications:      Medication List        CONTINUE taking these medications      Blood Pressure Kit  Check BP daily     clonazePAM 0.5 MG tablet  Commonly known as: KLONOPIN     cloNIDine 0.1 MG tablet  Commonly known as: CATAPRES     desvenlafaxine succinate 100 MG Tb24 extended release tablet  Commonly known as: PRISTIQ     gabapentin  600 MG tablet  Commonly known as: NEURONTIN   Take 1 tablet by mouth 3 times daily for 30 days.     methadone  10 MG/5ML solution     QUEtiapine 25 MG tablet  Commonly known as: SEROQUEL            STOP taking these medications      DULoxetine 20 MG extended release capsule  Commonly known as: CYMBALTA     famotidine  40 MG tablet  Commonly known as: PEPCID      mirtazapine 7.5 MG tablet  Commonly known as: REMERON     omeprazole  40 MG delayed release capsule  Commonly known as: PRILOSEC              Time Spent on discharge is  35 mins in patient  examination, evaluation, counseling as well as medication reconciliation, prescriptions for required medications, discharge plan and follow up.    Electronically signed by   [name] Lynell, MD  04/09/2024  1:35 PM      Thank you Dr. Lynell Kleine, MD for the opportunity to be involved in this patient's care.

## 2024-04-06 NOTE — Progress Notes (Signed)
 RN clarified constant observer order with Dr. Lynell. Okay to discontinue constant observer order.
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
## 2024-04-06 NOTE — Plan of Care (Signed)
 Problem: Self Harm/Suicidality  Goal: Will have no self-injury during hospital stay  Description: INTERVENTIONS:  1.  Ensure constant observer at bedside with Q15M safety checks  2.  Maintain a safe environment  3.  Secure patient belongings  4.  Ensure family/visitors adhere to safety recommendations  5.  Ensure safety tray has been added to patient's diet order  6.  Every shift and PRN: Re-assess suicidal risk via Frequent Screener    Outcome: Progressing     Problem: Safety - Adult  Goal: Free from fall injury  Outcome: Progressing

## 2024-04-06 NOTE — Progress Notes (Signed)
 Consult perfect served to dr javier.  Report called to beth on [facility]. Patient will transfer to [id number]
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
## 2024-04-06 NOTE — Progress Notes (Signed)
 Dr. Oleh Genin (Neurology) notified of consult.

## 2024-04-06 NOTE — Progress Notes (Signed)
 RN spoke to poison control over the phone and provided an update on pt and plan of care.

## 2024-04-06 NOTE — Progress Notes (Signed)
 [place] [facility] called with dose verification of methadone .  The last time he dosed was Spetember 17th and his dose is 150mg . Dr DELENA reading informed.

## 2024-04-06 NOTE — Progress Notes (Signed)
 [facility]   Occupational Therapy Evaluation  Date: 04/06/24  Patient Name: David [name]       Room: 2121/2121-01  MRN: [id number]  Account: [id number]   DOB: 1986-04-19  (38 y.o.) Gender: male     Discharge Recommendations:  The patient's needs are being met with no further Occupational Therapy recommended at discharge.       OT Equipment Recommendations  Equipment Needed: No    Referring Practitioner: Lynell Kleine, MD  Diagnosis: drug overdose, accidental or unintentional           Past Medical History:  has a past medical history of Abdominal pain, Addiction to drug [facility]), Allergic rhinitis, Anxiety, Back pain, GI bleed, Headache, and Obesity.    Past Surgical History:   has a past surgical history that includes pr egd transoral biopsy single/multiple (N/A, 03/23/2016).    Restrictions  Restrictions/Precautions  Restrictions/Precautions: General Precautions  Required Braces or Orthoses?: No      Vitals  Vitals  O2 Device: None (Room air)     Subjective  Subjective: Pt cooperative and agreeable to engage in OT eval  Comments: Ok per [employer] for OT eval  Pain  Pre-Pain: 0  Post-Pain: 0    Social/Functional History  Social/Functional History  Lives With: Spouse (2 kids and wife)  Type of Home:  (duplex)  Home Layout: One level  Home Access: Stairs to enter without rails  Entrance Stairs - Number of Steps: 2 STE  Bathroom Shower/Tub: Psychologist, counselling, Biochemist, clinical: Midwife: None  Bathroom Accessibility: Accessible  Prior Level of Assist for ADLs: Independent  Prior Level of Assist for Homemaking: Independent  Prior Level of Assist for Ambulation: Independent household ambulator, with or without device, Teacher, adult education, with or without device  Prior Level of Assist for Transfers: Surveyor, minerals: No  Patient's Herbalist: mother or uber  Occupation: Unemployed  IADL Comments: pt sleeps in flat bed  Additional Comments: pt reports mother  lives nearby and able to help if needed    Objective    ADL  Feeding: Independent, Based on clinical judgement  Grooming: Independent, Based on clinical judgement  UE Bathing: Independent, Based on clinical judgement  LE Bathing: Independent, Based on clinical judgement  UE Dressing: Independent, Based on clinical judgement  LE Dressing: Independent, Based on clinical judgement  Putting On/Taking Off Footwear: Modified independent   Putting On/Taking Off Footwear Skilled Clinical Factors: seated to don B socks while bending forward  Toileting: Independent, Based on clinical judgement  Additional Comments: ADL scores based on skilled observations and clinical reasoning unless otherwise noted.       Orientation  Overall Orientation Status: Within Functional Limits  Orientation Level: Oriented to place, Oriented to time, Oriented to person      Sensation  Overall Sensation Status: WFL  Vision  Vision: Within Functional Limits  Hearing  Hearing: Within Functional Limits       UE Function  LUE AROM (degrees)  LUE AROM : WFL  Left Hand AROM (degrees)  Left Hand AROM: WFL  Tone LUE  LUE Tone: Normotonic  LUE Strength  Gross LUE Strength: WFL  L Hand General: 4/5    RUE AROM (degrees)  RUE AROM : WFL  Right Hand AROM (degrees)  Right Hand AROM: WFL  Tone RUE  RUE Tone: Normotonic  RUE Strength  Gross RUE Strength: WFL  R Hand General: 4/5    Fine  Motor Skills/Coordination  Coordination  Movements Are Fluid And Coordinated: Yes              Bed Mobility  Bed mobility  Supine to Sit: Modified independent  Sit to Supine: Modified independent  Scooting: Independent    Balance  Balance  Sitting Balance: Independent  Standing Balance: Independent       Transfers  Transfers  Sit to stand: Independent  Stand to sit: Independent  Copywriter, advertising - Technique: Ambulating  Equipment Used: Lobbyist Transfers Comments: simulated toilet transfer    Functional Mobility  Functional -  Mobility Device: No device  Activity: Other (in-room)  Assist Level: Independent        Assessment  Assessment  Assessment: Pt demonstrated independent functional mobility and transfers. Pt functioning at baseline level for self-care and mobility. No acute needs at this time. OT being discontinued. Please re-order if there is a change in status.  Decision Making: Low Complexity  No Skilled OT: At baseline function, Independent with ADL's, Independent with functional mobility, No OT goals identified    Activity Tolerance  Activity Tolerance: Patient Tolerated treatment well    Safety Devices  Type of Devices: Call light within reach, Bed alarm in place, Gait belt, Left in bed, Nurse notified    Patient Education  Patient Education  Education Given To: Patient  Education Provided: Role of Therapy, Plan of Care  Education Method: Verbal  Education Outcome: Verbalized understanding    Functional Outcome Measures  AM-PAC Daily Activity - Inpatient   How much help is needed for putting on and taking off regular lower body clothing?: None  How much help is needed for bathing (which includes washing, rinsing, drying)?: None  How much help is needed for toileting (which includes using toilet, bedpan, or urinal)?: None  How much help is needed for putting on and taking off regular upper body clothing?: None  How much help is needed for taking care of personal grooming?: None  How much help for eating meals?: None  AM-PAC Inpatient Daily Activity Raw Score: 24  AM-PAC Inpatient ADL T-Scale Score : 57.54  ADL Inpatient CMS 0-100% Score: 0  ADL Inpatient CMS G-Code Modifier : CH       Goals     Short Term Goals  Time Frame for Short Term Goals: DC OT    Plan  Occupational Therapy Plan  Times Per Week: DC OT    OT Individual Minutes  OT Individual Minutes  Time In: 1423  Time Out: 1441  Minutes: 18       Electronically signed by Lyle Baize, OTR/L on 04/06/24 at 3:41 PM EDT

## 2024-04-06 NOTE — Care Coordination-Inpatient (Signed)
 Case Management Assessment  Initial Evaluation    Date/Time of Evaluation: 04/06/2024 2:51 PM  Assessment Completed by: Wells Chary, MSW, LSW    If patient is discharged prior to next notation, then this note serves as note for discharge by case management.    Patient Name: David [name]                   Date of Birth: 1986/05/15  Diagnosis: Polypharmacy [Z79.899]  Drug overdose, accidental or unintentional, initial encounter [T50.901A]  Sedative overdose, undetermined intent, initial encounter [T42.74XA]                   Date / Time: 04/05/2024  1:15 PM    Patient Admission Status: Inpatient   Readmission Risk (Low < 19, Mod (19-27), High > 27): Readmission Risk Score: 7.7    Current PCP: Lynell Kleine, MD  PCP verified by CM? Yes    Chart Reviewed: Yes      History Provided by:    Patient Orientation: Alert and Oriented    Patient Cognition: Alert    Hospitalization in the last 30 days (Readmission):  No    If yes, Readmission Assessment in CM Navigator will be completed.    Advance Directives:      Code Status: Full Code   Patient's Primary Decision Maker is: Legal Next of Kin      Discharge Planning:    Patient lives with: Spouse/Significant Other Type of Home: Apartment  Primary Care Giver: Self  Patient Support Systems include: Spouse/Significant Other   Current Financial resources: Medicaid  Current community resources: None  Current services prior to admission: Other (Comment) ([facility])            Current DME:              Type of Home Care services:  None    ADLS  Prior functional level: Independent in ADLs/IADLs  Current functional level: Independent in ADLs/IADLs    PT AM-PAC:   /24  OT AM-PAC:   /24    Family can provide assistance at DC: No  Would you like Case Management to discuss the discharge plan with any other family members/significant others, and if so, who? No  Plans to Return to Present Housing: Yes  Other Identified Issues/Barriers to RETURNING to current housing:  none  Potential Assistance needed at discharge: N/A            Potential DME:    Patient expects to discharge to: Apartment  Plan for transportation at discharge: Family    Financial    Payor: [employer] [place] MEDICA[employer] / Plan: [employer] [place]  MEDICA / Product Type: *No Product type* /     Does insurance require precert for SNF: Yes    Potential assistance Purchasing Medications: No  Meds-to-Beds request:        [id number] - [place] , [place] - [address] - P [phone] [address] [phone]  [address]  [place]  [place] [address]  Phone: [phone] Fax: [phone]      Notes:    Factors facilitating achievement of predicted outcomes: Family support and Cooperative    Barriers to discharge: Decreased endurance    Additional Case Management Notes: 9/19- [employer]. Patient is from home with spouse and children. Independent. Follow with [facility]. States he's leaving AMA. Psychiatry signed off. Will follow for needs/ sc     The Plan for Transition of Care is related  to the following treatment goals of Polypharmacy [Z79.899]  Drug overdose, accidental or unintentional, initial encounter [T50.901A]  Sedative overdose, undetermined intent, initial encounter [T42.74XA]    IF APPLICABLE: The Patient and/or patient representative David [name] and his family were provided with a choice of provider and agrees with the discharge plan. Freedom of choice list with basic dialogue that supports the patient's individualized plan of care/goals and shares the quality data associated with the providers was provided to: Patient   Patient Representative Name:       The Patient and/or Patient Representative Agree with the Discharge Plan? Yes    Wells Chary, MSW, LSW  Case Management Department  Ph: [phone] Fax:

## 2024-04-09 NOTE — Care Coordination-Inpatient (Signed)
 Care Transitions Note    Initial Call - Call within 2 business days of discharge: Yes    Attempted to reach patient for transitions of care follow up. Unable to reach patient.    Outreach Attempts:   Unable to leave message.  VM not setup.    Patient: David [name]    Patient DOB: 11/01/1985   MRN: [id number]    Reason for Admission: Polypharmacy   Discharge Date: 04/06/24  RURS: Readmission Risk Score: 7.7    Last Discharge Facility       Date Complaint Diagnosis Description Type Department Provider    04/05/24 [facility] ... ED to Hosp-Admission (Discharged) (ADMITTED) [facility] Lynell Kleine, MD; [place], ...            Was this an external facility discharge? No    Follow Up Appointment:   Patient does not have a follow up appointment scheduled at time of call. CTN routed to PCP and PCP Practice to request that staff contact pt to schedule a HFU appointment within 7 days of discharge.       Plan for follow-up on next business day.      Lindy Pennisi, LPN

## 2024-04-10 NOTE — Care Coordination-Inpatient (Signed)
 Care Transitions Note    Initial Call - Call within 2 business days of discharge: Yes the 2nd and final attempt.    Attempted to reach patient for transitions of care follow up. Unable to reach patient.    Outreach Attempts:   Unable to leave message.     Patient: David [name]    Patient DOB: 1986/07/02   MRN: [id number]    Reason for Admission: Drug overdose  Discharge Date: 04/06/24  RURS: Readmission Risk Score: 7.7    Last Discharge Facility       Date Complaint Diagnosis Description Type Department Provider    04/05/24 [facility] ... ED to Hosp-Admission (Discharged) (ADMITTED) [facility] Lynell Kleine, MD; [facility], ...            Was this an external facility discharge? No    Follow Up Appointment:   Patient does not have a follow up appointment scheduled at time of call. Writer routed to PCP and PCP Pool on, 04/09/2024 to request assistance with scheduling HFU for this pt.      No further calls indicated     Lemond Setters, LPN

## 2024-04-10 NOTE — Telephone Encounter (Signed)
 1st attempt- vmb not set up

## 2024-04-11 NOTE — Telephone Encounter (Signed)
 2nd attempt, unable to reach or leave message.

## 2024-04-12 NOTE — Telephone Encounter (Signed)
 3rd attempt, unable to reach, sent letter.

## 2024-06-19 ENCOUNTER — Encounter

## 2024-06-19 MED ORDER — GABAPENTIN 600 MG PO TABS
600 | ORAL_TABLET | Freq: Three times a day (TID) | ORAL | 2 refills | 30.00000 days | Status: AC
Start: 2024-06-19 — End: 2024-07-19

## 2024-06-19 NOTE — Telephone Encounter (Signed)
"  Patient checking on refill request. Patient is out of the medication.  "

## 2024-06-29 ENCOUNTER — Encounter
Payer: Medicaid (Managed Care) | Attending: Student in an Organized Health Care Education/Training Program | Primary: Internal Medicine
